# Patient Record
Sex: Female | Born: 1995
Health system: Southern US, Community
[De-identification: ages and names within clinical notes are randomized; demographics above are authoritative.]

## PROBLEM LIST (undated history)

## (undated) ENCOUNTER — Inpatient Hospital Stay (HOSPITAL_COMMUNITY): Payer: Self-pay

## (undated) ENCOUNTER — Ambulatory Visit: Discharge status: Absent without leave | Payer: Medicaid Other

## (undated) DIAGNOSIS — F32A Depression, unspecified: Secondary | ICD-10-CM

## (undated) DIAGNOSIS — K589 Irritable bowel syndrome without diarrhea: Secondary | ICD-10-CM

## (undated) DIAGNOSIS — Z8669 Personal history of other diseases of the nervous system and sense organs: Secondary | ICD-10-CM

## (undated) DIAGNOSIS — F329 Major depressive disorder, single episode, unspecified: Secondary | ICD-10-CM

## (undated) DIAGNOSIS — G629 Polyneuropathy, unspecified: Secondary | ICD-10-CM

## (undated) DIAGNOSIS — R569 Unspecified convulsions: Secondary | ICD-10-CM

## (undated) DIAGNOSIS — F410 Panic disorder [episodic paroxysmal anxiety] without agoraphobia: Secondary | ICD-10-CM

## (undated) DIAGNOSIS — F419 Anxiety disorder, unspecified: Secondary | ICD-10-CM

## (undated) DIAGNOSIS — G40909 Epilepsy, unspecified, not intractable, without status epilepticus: Secondary | ICD-10-CM

## (undated) DIAGNOSIS — E079 Disorder of thyroid, unspecified: Secondary | ICD-10-CM

## (undated) HISTORY — PX: CHOLECYSTECTOMY: SHX55

## (undated) HISTORY — PX: ADENOIDECTOMY: SUR15

## (undated) HISTORY — DX: Epilepsy, unspecified, not intractable, without status epilepticus: G40.909

## (undated) HISTORY — DX: Unspecified convulsions: R56.9

## (undated) HISTORY — PX: TONSILLECTOMY: SUR1361

## (undated) HISTORY — DX: Polyneuropathy, unspecified: G62.9

## (undated) HISTORY — PX: BREAST SURGERY: SHX581

## (undated) HISTORY — DX: Panic disorder (episodic paroxysmal anxiety): F41.0

## (undated) HISTORY — DX: Personal history of other diseases of the nervous system and sense organs: Z86.69

## (undated) HISTORY — PX: EYE SURGERY: SHX253

---
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
## 2016-02-16 ENCOUNTER — Other Ambulatory Visit: Payer: Medicaid Other

## 2016-02-20 ENCOUNTER — Other Ambulatory Visit: Payer: Medicaid Other

## 2016-10-26 ENCOUNTER — Emergency Department (HOSPITAL_COMMUNITY)
Admission: EM | Admit: 2016-10-26 | Discharge: 2016-10-26 | Disposition: A | Discharge status: Home / Self Care | Payer: Medicaid Other | Attending: Emergency Medicine | Admitting: Emergency Medicine

## 2016-10-26 ENCOUNTER — Encounter (HOSPITAL_COMMUNITY): Payer: Self-pay | Admitting: Cardiology

## 2016-10-26 ENCOUNTER — Emergency Department (HOSPITAL_COMMUNITY): Payer: Medicaid Other

## 2016-10-26 DIAGNOSIS — R072 Precordial pain: Secondary | ICD-10-CM

## 2016-10-26 DIAGNOSIS — R0602 Shortness of breath: Secondary | ICD-10-CM

## 2016-10-26 DIAGNOSIS — M25572 Pain in left ankle and joints of left foot: Secondary | ICD-10-CM

## 2016-10-26 LAB — CBC WITH DIFFERENTIAL/PLATELET
Basophils Absolute: 0 10*3/uL (ref 0.0–0.1)
Basophils Relative: 0 %
EOS ABS: 0.1 10*3/uL (ref 0.0–0.7)
Eosinophils Relative: 1 %
HEMATOCRIT: 40.3 % (ref 36.0–46.0)
HEMOGLOBIN: 13.1 g/dL (ref 12.0–15.0)
LYMPHS ABS: 2.2 10*3/uL (ref 0.7–4.0)
LYMPHS PCT: 20 %
MCH: 29.4 pg (ref 26.0–34.0)
MCHC: 32.5 g/dL (ref 30.0–36.0)
MCV: 90.4 fL (ref 78.0–100.0)
MONOS PCT: 6 %
Monocytes Absolute: 0.6 10*3/uL (ref 0.1–1.0)
NEUTROS PCT: 73 %
Neutro Abs: 7.9 10*3/uL — ABNORMAL HIGH (ref 1.7–7.7)
Platelets: 324 10*3/uL (ref 150–400)
RBC: 4.46 MIL/uL (ref 3.87–5.11)
RDW: 13.1 % (ref 11.5–15.5)
WBC: 10.8 10*3/uL — ABNORMAL HIGH (ref 4.0–10.5)

## 2016-10-26 LAB — BASIC METABOLIC PANEL
Anion gap: 9 (ref 5–15)
BUN: 14 mg/dL (ref 6–20)
CHLORIDE: 107 mmol/L (ref 101–111)
CO2: 22 mmol/L (ref 22–32)
CREATININE: 0.51 mg/dL (ref 0.44–1.00)
Calcium: 8.9 mg/dL (ref 8.9–10.3)
GFR calc non Af Amer: >60 mL/min (ref >60)
Glucose, Bld: 85 mg/dL (ref 65–99)
POTASSIUM: 3.7 mmol/L (ref 3.5–5.1)
Sodium: 138 mmol/L (ref 135–145)

## 2016-10-26 LAB — D-DIMER, QUANTITATIVE (NOT AT ARMC): D DIMER QUANT: 0.37 ug{FEU}/mL (ref 0.00–0.50)

## 2016-10-26 LAB — I-STAT TROPONIN, ED: Troponin i, poc: 0 ng/mL (ref 0.00–0.08)

## 2016-10-26 LAB — PREGNANCY, URINE: Preg Test, Ur: POSITIVE — AB

## 2016-10-26 NOTE — ED Provider Notes (Signed)
Emergency Department Provider Note   I have reviewed the triage vital signs and the nursing notes.   HISTORY  Chief Complaint Shortness of Breath   HPI Jody Carter is a 21 y.o. female with PMH of thyroid disease and migraines presents to the emergency department for evaluation of intermittent chest pain with associated dyspnea. Patient has had 3 total episodes. The first episode began last night. She describes a central chest tightness with associated rapid breathing. She experienced tingling around her mouth and in both hands during this event. The chest pain insurance of breath lasted for approximately 2-3 minutes and resolves spontaneously. No provoking or modifying factors. Today the patient had 2 additional episodes which caused her to fall to the ground. Denies fevers, chills, productive cough. No head trauma. She reports some mild chest discomfort at this time states it is slightly worse with deep breathing. Patient also mentions that she had a positive home pregnancy test 2 days ago but tested herself immediately afterwards and obtained a negative test so would like this checked as well. No history of blood clots. No recent surgeries or OCPs.    Past Medical History:  Diagnosis Date  . Hx of migraines   . Thyroid disease     Patient Active Problem List   Diagnosis Date Noted  . Active labor 02/18/2014  . Susceptible to varicella (non-immune), currently pregnant 07/03/2013  . Supervision of normal first teen pregnancy 06/15/2013  . Hx of migraines 07/31/2012    Past Surgical History:  Procedure Laterality Date  . ADENOIDECTOMY    . BREAST SURGERY     augmentation  . EYE SURGERY Bilateral    closed tear ducts opened as child  . TONSILLECTOMY      Current Outpatient Rx  . Order #: [id number] Class: Historical Med    Allergies Sumatriptan  Family History  Problem Relation Age of Onset  . Heart attack Father   . Diabetes Paternal Grandfather   .  Hypertension Maternal Grandmother   . Heart attack Maternal Grandmother   . Hypertension Maternal Grandfather   . Heart attack Maternal Grandfather     Social History Social History  Substance Use Topics  . Smoking status: Never Smoker  . Smokeless tobacco: Never Used  . Alcohol use No    Review of Systems  Constitutional: No fever/chills. Positive lightheadedness and fall.  Eyes: No visual changes. ENT: No sore throat. Cardiovascular: Positive chest pain. Respiratory: Positive shortness of breath. Gastrointestinal: No abdominal pain.  No nausea, no vomiting.  No diarrhea.  No constipation. Genitourinary: Negative for dysuria. Musculoskeletal: Negative for back pain. Skin: Negative for rash. Neurological: Negative for headaches, focal weakness or numbness.  10-point ROS otherwise negative.  ____________________________________________   PHYSICAL EXAM:  VITAL SIGNS: ED Triage Vitals  Enc Vitals Group     BP 10/26/16 1234 (!) 146/52     Pulse Rate 10/26/16 1234 93     Resp 10/26/16 1234 18     Temp 10/26/16 1234 98.2 F (36.8 C)     Temp Source 10/26/16 1234 Oral     SpO2 10/26/16 1234 100 %     Weight 10/26/16 1236 136 lb (61.7 kg)     Height 10/26/16 1236  (1.575 m)     Pain Score 10/26/16 1234 6   Constitutional: Alert and oriented. Well appearing and in no acute distress. Eyes: Conjunctivae are normal. Head: Atraumatic. Nose: No congestion/rhinnorhea. Mouth/Throat: Mucous membranes are moist.  Neck: No stridor.  Cardiovascular:  Normal rate, regular rhythm. Good peripheral circulation. Grossly normal heart sounds.   Respiratory: Normal respiratory effort.  No retractions. Lungs CTAB. Gastrointestinal: Soft and nontender. No distention.  Musculoskeletal: No lower extremity tenderness nor edema. No gross deformities of extremities. Neurologic:  Normal speech and language. No gross focal neurologic deficits are appreciated.  Skin:  Skin is warm, dry and  intact. No rash noted.  ____________________________________________   LABS (all labs ordered are listed, but only abnormal results are displayed)  Labs Reviewed  CBC WITH DIFFERENTIAL/PLATELET - Abnormal; Notable for the following:       Result Value   WBC 10.8 (*)    Neutro Abs 7.9 (*)    All other components within normal limits  PREGNANCY, URINE - Abnormal; Notable for the following:    Preg Test, Ur POSITIVE (*)    All other components within normal limits  BASIC METABOLIC PANEL  D-DIMER, QUANTITATIVE (NOT AT [facility])  I-STAT TROPONIN, ED   ____________________________________________  EKG   EKG Interpretation  Date/Time:  Tuesday October 26 2016 15:53:42 EDT Ventricular Rate:  93 PR Interval:  130 QRS Duration: 119 QT Interval:  355 QTC Calculation: 442 R Axis:   67 Text Interpretation:  Sinus rhythm Incomplete right bundle branch block No STEMI.  Confirmed by Alona Bene [phone]) on 10/26/2016 4:02:00 PM       ____________________________________________  RADIOLOGY  Dg Chest 2 View  Result Date: 10/26/2016 CLINICAL DATA:  Shortness of breath and central chest pain began last night. The patient had 2 episodes of this this morning associated with dizziness and a fall. Currently the chest discomfort and shortness of breath have abated. EXAM: CHEST  2 VIEW COMPARISON:  Chest x-ray of April 14, 2015 FINDINGS: The lungs are mildly hyperinflated and clear. The heart and pulmonary vascularity are normal. There is no pleural effusion or pneumothorax. The bony thorax is unremarkable. IMPRESSION: Mild hyperinflation may be voluntary or may reflect reactive airway disease. There is no other cardiopulmonary abnormality. Electronically Signed   By: David  [place] M.D.   On: 10/26/2016 16:21   Dg Ankle Complete Left  Result Date: 10/26/2016 CLINICAL DATA:  Dizziness and syncope with resultant fall. Persistent left ankle pain. EXAM: LEFT ANKLE COMPLETE - 3+ VIEW COMPARISON:  None in  PACs FINDINGS: The bones are subjectively adequately mineralized. There is no acute malleolar fracture. The ankle joint mortise is preserved. The talar dome is intact. The remainder the talus as well as calcaneus are also intact. There is irregularity of the posterosuperior articular margin of the tarsal navicular which is likely degenerative. There is no overlying soft tissue swelling. IMPRESSION: There is no acute bony abnormality of the ankle. Mild soft tissue swelling medially is observed. Probable old avulsion from the posterosuperior margin of the tarsal navicular along the superior aspect of the articulation with the talus. Electronically Signed   By: David  [place] M.D.   On: 10/26/2016 16:23    ____________________________________________   PROCEDURES  Procedure(s) performed:   Procedures  None ____________________________________________   INITIAL IMPRESSION / ASSESSMENT AND PLAN / ED COURSE  Pertinent labs & imaging results that were available during my care of the patient were reviewed by me and considered in my medical decision making (see chart for details).  Patient resents emergency department for evaluation of multiple episodes of chest pain with shortness of breath and lightheadedness. She had a fall and some resulting left ankle pain. Patient with mild active symptoms at this time. Chest pain shortness of  breath seemed to be episodic which lower my suspicion for PE or ACS. Plan for D-dimer, Troponin, and EKG along with CXR.   Differential includes all life-threatening causes for chest pain. This includes but is not exclusive to acute coronary syndrome, aortic dissection, pulmonary embolism, cardiac tamponade, community-acquired pneumonia, pericarditis, musculoskeletal chest wall pain, etc.   05:05 PM Lab work is largely unremarkable including troponin and d-dimer. Extremely low suspicion for PE at this point so will not follow with additional imaging. Patient's urine  pregnancy test is positive. Discussed this with her and she will follow with OB/Gyn from last pregnancy. Discussed PCP follow up for SOB/CP symptoms.   At this time, I do not feel there is any life-threatening condition present. I have reviewed and discussed all results (EKG, imaging, lab, urine as appropriate), exam findings with patient. I have reviewed nursing notes and appropriate previous records.  I feel the patient is safe to be discharged home without further emergent workup. Discussed usual and customary return precautions. Patient and family (if present) verbalize understanding and are comfortable with this plan.  Patient will follow-up with their primary care provider. If they do not have a primary care provider, information for follow-up has been provided to them. All questions have been answered.  ____________________________________________  FINAL CLINICAL IMPRESSION(S) / ED DIAGNOSES  Final diagnoses:  Shortness of breath  Acute left ankle pain  Precordial chest pain     MEDICATIONS GIVEN DURING THIS VISIT:  Medications - No data to display   NEW OUTPATIENT MEDICATIONS STARTED DURING THIS VISIT:  None   Note:  This document was prepared using Dragon voice recognition software and may include unintentional dictation errors.  Alona Bene, MD Emergency Medicine    Long, Arlyss Repress, MD 10/26/16 1710

## 2016-10-26 NOTE — ED Triage Notes (Signed)
2 episodes this morning where she felt like she could not catch her breath. Pt states she fell also this morning going up the stairs.  Abrasion to left knee .  C/o pain to left ankle.  Pt states her breathing has improved some.  Pt states her chest has also been hurting this morning.

## 2016-10-26 NOTE — Discharge Instructions (Signed)
You were seen in the ED today with chest pain and difficulty breathing. Your testing today was normal. You will need to follow up with your PCP in the coming week to review your symptoms.   Your urine pregnancy test was positive. You will need to establish care with an OB/Gyn for further care. I have listed an OB/Gyn that you can call if your prior provider cannot see you.   Return to the ED with any new or worsening chest pain, difficulty breathing, vaginal bleeding, or lower abdominal pain.

## 2016-12-17 ENCOUNTER — Encounter (HOSPITAL_COMMUNITY): Payer: Self-pay

## 2016-12-17 ENCOUNTER — Emergency Department (HOSPITAL_COMMUNITY)
Admission: EM | Admit: 2016-12-17 | Discharge: 2016-12-17 | Disposition: A | Discharge status: Home / Self Care | Payer: Medicaid Other | Attending: Emergency Medicine | Admitting: Emergency Medicine

## 2016-12-17 ENCOUNTER — Other Ambulatory Visit: Payer: Self-pay

## 2016-12-17 DIAGNOSIS — R1900 Intra-abdominal and pelvic swelling, mass and lump, unspecified site: Secondary | ICD-10-CM | POA: Diagnosis present

## 2016-12-17 DIAGNOSIS — L03311 Cellulitis of abdominal wall: Secondary | ICD-10-CM | POA: Insufficient documentation

## 2016-12-17 DIAGNOSIS — L02211 Cutaneous abscess of abdominal wall: Secondary | ICD-10-CM | POA: Insufficient documentation

## 2016-12-17 DIAGNOSIS — L0291 Cutaneous abscess, unspecified: Secondary | ICD-10-CM

## 2016-12-17 MED ORDER — LEVOFLOXACIN 500 MG PO TABS: 500.0000 mg | ORAL_TABLET | Freq: Every day | ORAL | 0 refills | Status: DC

## 2016-12-17 MED ORDER — CEPHALEXIN 500 MG PO CAPS: 500.0000 mg | ORAL_CAPSULE | Freq: Four times a day (QID) | ORAL | 0 refills | Status: DC

## 2016-12-17 MED ORDER — LIDOCAINE HCL (PF) 1 % IJ SOLN
5.0000 mL | Freq: Once | INTRAMUSCULAR | Status: AC
  Administered 2016-12-17: 5 mL
  Filled 2016-12-17: qty 5

## 2016-12-17 NOTE — Discharge Instructions (Signed)
Medications: Levaquin, Keflex  Treatment: Take the Levaquin and Keflex as prescribed.  Make sure to finish all these medications.  Keep the dressing applied for 24 hours.  After this time he can wash with warm soapy water and apply clean dressing.  Follow-up: Please return in 2 days for wound check.  Please return sooner if you develop any fevers over 100.4, increasing pain, spreading of the redness surrounding, red streaking away from the wound, or any other new or concerning symptoms.

## 2016-12-17 NOTE — ED Triage Notes (Signed)
Per Pt, Pt had naval piercing that started to become red and be painful. Pt reports taking the piercing out and since then, the site has become more red and tender. Denies fevers. Reports some clear discharge with some blood.

## 2016-12-18 NOTE — ED Provider Notes (Signed)
[facility]   CSN: [id number] Arrival date & time: 12/17/16  1854     History   Chief Complaint Chief Complaint  Patient presents with  . Wound Infection    HPI Jody Carter is a 21 y.o. female with history of thyroid disease, migraines who presents with a several day history of swelling to her navel.  Patient reports getting her navel pierced in July and having she began having redness, pain, and clear drainage as well as blood coming from the piercing.  She removed the piercing, however it has since become increasingly painful and warm.  She denies any fevers.  Patient reports she has tried to have her navel pierced several times in the past, however has had problems and had to take it out.  Of note, she reports she recently had a spontaneous abortion 1 week ago.  HPI  Past Medical History:  Diagnosis Date  . Hx of migraines   . Thyroid disease     Patient Active Problem List   Diagnosis Date Noted  . Active labor 02/18/2014  . Susceptible to varicella (non-immune), currently pregnant 07/03/2013  . Supervision of normal first teen pregnancy 06/15/2013  . Hx of migraines 07/31/2012    Past Surgical History:  Procedure Laterality Date  . ADENOIDECTOMY    . BREAST SURGERY     augmentation  . EYE SURGERY Bilateral    closed tear ducts opened as child  . TONSILLECTOMY      OB History    Gravida Para Term Preterm AB Living   1 1 1     1    SAB TAB Ectopic Multiple Live Births         0 1       Home Medications    Prior to Admission medications   Medication Sig Start Date End Date Taking? Authorizing Provider  ibuprofen (ADVIL,MOTRIN) 200 MG tablet Take 600 mg by mouth every 6 (six) hours as needed for mild pain.   Yes [provider]  levothyroxine (SYNTHROID, LEVOTHROID) 88 MCG tablet Take 88 mcg by mouth daily before breakfast.    Yes [provider]  cephALEXin (KEFLEX) 500 MG capsule  Take 1 capsule (500 mg total) by mouth 4 (four) times daily. 12/17/16   Nandi Tonnesen, Waylan BogaAlexandra M, PA-C  levofloxacin (LEVAQUIN) 500 MG tablet Take 1 tablet (500 mg total) by mouth daily. 12/17/16   Emi HolesLaw, Laverle Pillard M, PA-C    Family History Family History  Problem Relation Age of Onset  . Heart attack Father   . Diabetes Paternal Grandfather   . Hypertension Maternal Grandmother   . Heart attack Maternal Grandmother   . Hypertension Maternal Grandfather   . Heart attack Maternal Grandfather     Social History Social History   Tobacco Use  . Smoking status: Never Smoker  . Smokeless tobacco: Never Used  Substance Use Topics  . Alcohol use: No  . Drug use: No     Allergies   Sumatriptan   Review of Systems Review of Systems  Constitutional: Negative for fever.  Skin: Positive for color change and wound.     Physical Exam Updated Vital Signs BP 120/65 (BP Location: Right Arm)   Pulse 75   Temp 98.7 F (37.1 C) (Oral)   Resp 16   Ht 5\' 2"  (1.575 m)   Wt 59 kg (130 lb)   SpO2 98%   BMI 23.78 kg/m   Physical Exam  Constitutional: She  appears well-developed and well-nourished. No distress.  HENT:  Head: Normocephalic and atraumatic.  Mouth/Throat: Oropharynx is clear and moist. No oropharyngeal exudate.  Eyes: Conjunctivae are normal. Pupils are equal, round, and reactive to light. Right eye exhibits no discharge. Left eye exhibits no discharge. No scleral icterus.  Neck: Normal range of motion. Neck supple. No thyromegaly present.  Cardiovascular: Normal rate, regular rhythm, normal heart sounds and intact distal pulses. Exam reveals no gallop and no friction rub.  No murmur heard. Pulmonary/Chest: Effort normal and breath sounds normal. No stridor. No respiratory distress. She has no wheezes. She has no rales.  Abdominal: Soft. Bowel sounds are normal. She exhibits no distension. There is no tenderness. There is no rebound and no guarding.    Musculoskeletal: She  exhibits no edema.  Lymphadenopathy:    She has no cervical adenopathy.  Neurological: She is alert. Coordination normal.  Skin: Skin is warm and dry. No rash noted. She is not diaphoretic. No pallor.  Psychiatric: She has a normal mood and affect.  Nursing note and vitals reviewed.    ED Treatments / Results  Labs (all labs ordered are listed, but only abnormal results are displayed) Labs Reviewed - No data to display  EKG  EKG Interpretation None       Radiology No results found.  Procedures .Marland KitchenIncision and Drainage Date/Time: 12/18/2016 1:47 AM Performed by: Emi Holes, PA-C Authorized by: Emi Holes, PA-C   Consent:    Consent obtained:  Verbal   Consent given by:  Patient   Risks discussed:  Bleeding and pain   Alternatives discussed:  No treatment and alternative treatment Location:    Type:  Abscess   Size:  1.5 cm diameter   Location:  Trunk   Trunk location:  Abdomen Pre-procedure details:    Procedure prep: iodine. Anesthesia (see MAR for exact dosages):    Anesthesia method:  Local infiltration   Local anesthetic:  Lidocaine 1% w/o epi Procedure type:    Complexity:  Simple Procedure details:    Incision types:  Single straight   Incision depth:  Dermal   Scalpel blade:  11   Wound management:  Probed and deloculated and irrigated with saline   Drainage:  Purulent and bloody   Drainage amount:  Moderate   Wound treatment:  Wound left open   Packing materials:  None Post-procedure details:    Patient tolerance of procedure:  Tolerated well, no immediate complications   (including critical care time)  Medications Ordered in ED Medications  lidocaine (PF) (XYLOCAINE) 1 % injection 5 mL (5 mLs Infiltration Given 12/17/16 2214)     Initial Impression / Assessment and Plan / ED Course  I have reviewed the triage vital signs and the nursing notes.  Pertinent labs & imaging results that were available during my care of the patient  were reviewed by me and considered in my medical decision making (see chart for details).     Patient with skin abscess. Incision and drainage performed in the ED today.  Abscess was not large enough to warrant packing or drain placement. Wound recheck in 2 days. Supportive care and return precautions discussed.  Pt sent home with Levaquin and Keflex, considering puncture wound and foreign body to cover Pseudomonas.  Patient advised not to put her piercing back in and advised not to try and re-pierced area.  Patient understands and agrees with plan.  Patient vitals stable throughout ED course and discharged in satisfactory condition.  Final Clinical Impressions(s) / ED Diagnoses   Final diagnoses:  Abscess  Cellulitis of abdominal wall    ED Discharge Orders        Ordered    levofloxacin (LEVAQUIN) 500 MG tablet  Daily     12/17/16 2227    cephALEXin (KEFLEX) 500 MG capsule  4 times daily     12/17/16 2227       Emi HolesLaw, Dwan Hemmelgarn M, PA-C 12/18/16 0151    Doug SouJacubowitz, Sam, MD 12/18/16 [phone]

## 2017-01-30 ENCOUNTER — Emergency Department (HOSPITAL_COMMUNITY)
Admission: EM | Admit: 2017-01-30 | Discharge: 2017-01-30 | Disposition: A | Discharge status: Home / Self Care | Payer: Medicaid Other | Attending: Emergency Medicine | Admitting: Emergency Medicine

## 2017-01-30 ENCOUNTER — Encounter (HOSPITAL_COMMUNITY): Payer: Self-pay | Admitting: Emergency Medicine

## 2017-01-30 ENCOUNTER — Other Ambulatory Visit: Payer: Self-pay

## 2017-01-30 ENCOUNTER — Emergency Department (HOSPITAL_COMMUNITY): Payer: Medicaid Other

## 2017-01-30 DIAGNOSIS — Y929 Unspecified place or not applicable: Secondary | ICD-10-CM | POA: Diagnosis not present

## 2017-01-30 DIAGNOSIS — Y999 Unspecified external cause status: Secondary | ICD-10-CM | POA: Insufficient documentation

## 2017-01-30 DIAGNOSIS — W228XXA Striking against or struck by other objects, initial encounter: Secondary | ICD-10-CM | POA: Diagnosis not present

## 2017-01-30 DIAGNOSIS — Z79899 Other long term (current) drug therapy: Secondary | ICD-10-CM | POA: Insufficient documentation

## 2017-01-30 DIAGNOSIS — S6991XA Unspecified injury of right wrist, hand and finger(s), initial encounter: Secondary | ICD-10-CM | POA: Insufficient documentation

## 2017-01-30 DIAGNOSIS — Y9389 Activity, other specified: Secondary | ICD-10-CM | POA: Insufficient documentation

## 2017-01-30 NOTE — ED Provider Notes (Signed)
[facility] EMERGENCY DEPARTMENT Provider Note   CSN: [id number] Arrival date & time: 01/30/17  2100     History   Chief Complaint Chief Complaint  Patient presents with  . Hand Pain    HPI Jody Carter is a 22 y.o. female.  HPI   Jody Carter is a 23 y.o. female, presenting to the ED with right hand pain beginning this morning.  Patient states she punched a wall.  Pain is the dorsal ulnar right hand.  Pain is moderate, throbbing, radiating into the wrist.  Endorses decreased sensation to the dorsal hand.  Denies weakness, wounds, other injuries, or any other complaints.      Past Medical History:  Diagnosis Date  . Hx of migraines   . Thyroid disease     Patient Active Problem List   Diagnosis Date Noted  . Active labor 02/18/2014  . Susceptible to varicella (non-immune), currently pregnant 07/03/2013  . Supervision of normal first teen pregnancy 06/15/2013  . Hx of migraines 07/31/2012    Past Surgical History:  Procedure Laterality Date  . ADENOIDECTOMY    . BREAST SURGERY     augmentation  . EYE SURGERY Bilateral    closed tear ducts opened as child  . TONSILLECTOMY      OB History    Gravida Para Term Preterm AB Living   1 1 1     1    SAB TAB Ectopic Multiple Live Births         0 1       Home Medications    Prior to Admission medications   Medication Sig Start Date End Date Taking? Authorizing Provider  cephALEXin (KEFLEX) 500 MG capsule Take 1 capsule (500 mg total) by mouth 4 (four) times daily. 12/17/16   Law, Waylan Boga, PA-C  ibuprofen (ADVIL,MOTRIN) 200 MG tablet Take 600 mg by mouth every 6 (six) hours as needed for mild pain.    [provider]  levofloxacin (LEVAQUIN) 500 MG tablet Take 1 tablet (500 mg total) by mouth daily. 12/17/16   Law, Waylan Boga, PA-C  levothyroxine (SYNTHROID, LEVOTHROID) 88 MCG tablet Take 88 mcg by mouth daily before breakfast.     [provider]    Family  History Family History  Problem Relation Age of Onset  . Heart attack Father   . Diabetes Paternal Grandfather   . Hypertension Maternal Grandmother   . Heart attack Maternal Grandmother   . Hypertension Maternal Grandfather   . Heart attack Maternal Grandfather     Social History Social History   Tobacco Use  . Smoking status: Never Smoker  . Smokeless tobacco: Never Used  Substance Use Topics  . Alcohol use: No  . Drug use: No     Allergies   Sumatriptan   Review of Systems Review of Systems  Musculoskeletal: Positive for arthralgias.  Neurological: Negative for weakness.     Physical Exam Updated Vital Signs BP 124/69 (BP Location: Left Arm)   Pulse 62   Temp 97.9 F (36.6 C) (Oral)   Resp 16   Ht 5\' 2"  (1.575 m)   Wt 62.6 kg (138 lb)   SpO2 99%   BMI 25.24 kg/m   Physical Exam  Constitutional: She appears well-developed and well-nourished. No distress.  HENT:  Head: Normocephalic and atraumatic.  Eyes: Conjunctivae are normal.  Neck: Neck supple.  Cardiovascular: Normal rate, regular rhythm and intact distal pulses.  Pulmonary/Chest: Effort normal.  Musculoskeletal: She exhibits  tenderness.  Some tenderness and bruising over the right fifth metacarpal.  No noted instability, crepitus, or deformity. Full range of motion in the right hand, wrist, and elbow.  Neurological: She is alert.  Patient endorses decreased sensation to the right dorsal ulnar hand. No other sensory abnormalities noted. Patient can touch the thumb to each one of the fingertips without difficulty.  Abduction and adduction of the fingers intact against resistance. Grip strength equal bilaterally. Strength 5/5 through the cardinal directions of the bilateral wrists. Strength 5/5 with flexion and extension of the bilateral elbows.  Skin: Skin is warm and dry. Capillary refill takes less than 2 seconds. She is not diaphoretic. No pallor.  Psychiatric: She has a normal mood and  affect. Her behavior is normal.  Nursing note and vitals reviewed.    ED Treatments / Results  Labs (all labs ordered are listed, but only abnormal results are displayed) Labs Reviewed - No data to display  EKG  EKG Interpretation None       Radiology Dg Hand Complete Right  Result Date: 01/30/2017 CLINICAL DATA:  Pain after punching a wall. EXAM: RIGHT HAND - COMPLETE 3+ VIEW COMPARISON:  None. FINDINGS: Soft tissue swelling over the ulnar aspect of the hand and overlying the heads of the metacarpals on the lateral view. No acute fracture nor joint dislocations. IMPRESSION: Soft tissue swelling of the hand without acute fracture. Electronically Signed   By: Tollie Ethavid  Kwon M.D.   On: 01/30/2017 22:51    Procedures Procedures (including critical care time)  Medications Ordered in ED Medications - No data to display   Initial Impression / Assessment and Plan / ED Course  I have reviewed the triage vital signs and the nursing notes.  Pertinent labs & imaging results that were available during my care of the patient were reviewed by me and considered in my medical decision making (see chart for details).     Patient presents with right hand injury.  No acute abnormalities on x-ray.  Suspect decreased sensation may be from neuropraxia.  PCP versus hand specialist follow-up as needed. The patient was given instructions for home care as well as return precautions. Patient voices understanding of these instructions, accepts the plan, and is comfortable with discharge.     Final Clinical Impressions(s) / ED Diagnoses   Final diagnoses:  Injury of right hand, initial encounter    ED Discharge Orders    None       Concepcion LivingJoy, Karlea Mckibbin C, PA-C 01/30/17 2331    Rolland PorterJames, Mark, MD 02/08/17 [phone]

## 2017-01-30 NOTE — ED Triage Notes (Signed)
Pt c/o R hand pain onset this am after punching a wall. Bruising noted to hand.

## 2017-01-30 NOTE — Discharge Instructions (Signed)
You have been seen today for a hand injury. There were no acute abnormalities on the x-rays, including no sign of fracture or dislocation, however, there could be injuries to the soft tissues, such as the ligaments or tendons that are not seen on xrays. There could also be what are called occult fractures that are small fractures not seen on xray. Pain: Take 600 mg of ibuprofen every 6 hours or 440 mg (over the counter dose) to 500 mg (prescription dose) of naproxen every 12 hours for the next 3 days. After this time, these medications may be used as needed for pain. Take these medications with food to avoid upset stomach. Choose only one of these medications, do not take them together.  Tylenol: Should you continue to have additional pain while taking the ibuprofen or naproxen, you may add in tylenol as needed. Your daily total maximum amount of tylenol from all sources should be limited to 4000mg /day for persons without liver problems, or 2000mg /day for those with liver problems. Ice: May apply ice to the area over the next 24 hours for 15 minutes at a time to reduce swelling. Elevation: Keep the extremity elevated as often as possible to reduce pain and inflammation. Support: Wear the hand brace for support and comfort. Wear this until pain resolves.  Exercises: Start by performing these exercises a few times a week, increasing the frequency until you are performing them twice daily.  Follow up: If symptoms are improving, you may follow up with your primary care provider for any continued management. If symptoms are not improving, you may follow up with the orthopedic specialist.

## 2017-04-09 ENCOUNTER — Encounter (HOSPITAL_COMMUNITY): Payer: Self-pay | Admitting: Emergency Medicine

## 2017-04-09 ENCOUNTER — Emergency Department (HOSPITAL_COMMUNITY)
Admission: EM | Admit: 2017-04-09 | Discharge: 2017-04-10 | Disposition: A | Discharge status: Home / Self Care | Payer: Medicaid Other | Attending: Emergency Medicine | Admitting: Emergency Medicine

## 2017-04-09 DIAGNOSIS — Y929 Unspecified place or not applicable: Secondary | ICD-10-CM | POA: Insufficient documentation

## 2017-04-09 DIAGNOSIS — X781XXA Intentional self-harm by knife, initial encounter: Secondary | ICD-10-CM | POA: Diagnosis not present

## 2017-04-09 DIAGNOSIS — Y939 Activity, unspecified: Secondary | ICD-10-CM | POA: Diagnosis not present

## 2017-04-09 DIAGNOSIS — Y998 Other external cause status: Secondary | ICD-10-CM | POA: Diagnosis not present

## 2017-04-09 DIAGNOSIS — S1081XA Abrasion of other specified part of neck, initial encounter: Secondary | ICD-10-CM | POA: Diagnosis not present

## 2017-04-09 DIAGNOSIS — R45851 Suicidal ideations: Secondary | ICD-10-CM | POA: Diagnosis not present

## 2017-04-09 DIAGNOSIS — X780XXA Intentional self-harm by sharp glass, initial encounter: Secondary | ICD-10-CM | POA: Insufficient documentation

## 2017-04-09 DIAGNOSIS — Z79899 Other long term (current) drug therapy: Secondary | ICD-10-CM | POA: Insufficient documentation

## 2017-04-09 DIAGNOSIS — S50812A Abrasion of left forearm, initial encounter: Secondary | ICD-10-CM | POA: Diagnosis not present

## 2017-04-09 DIAGNOSIS — F329 Major depressive disorder, single episode, unspecified: Secondary | ICD-10-CM | POA: Insufficient documentation

## 2017-04-09 LAB — COMPREHENSIVE METABOLIC PANEL
ALK PHOS: 55 U/L (ref 38–126)
ALT: 11 U/L — ABNORMAL LOW (ref 14–54)
ANION GAP: 9 (ref 5–15)
AST: 15 U/L (ref 15–41)
Albumin: 4.1 g/dL (ref 3.5–5.0)
BILIRUBIN TOTAL: 0.7 mg/dL (ref 0.3–1.2)
BUN: 18 mg/dL (ref 6–20)
CALCIUM: 9.3 mg/dL (ref 8.9–10.3)
CO2: 23 mmol/L (ref 22–32)
Chloride: 109 mmol/L (ref 101–111)
Creatinine, Ser: 0.46 mg/dL (ref 0.44–1.00)
GFR calc Af Amer: >60 mL/min (ref >60)
Glucose, Bld: 96 mg/dL (ref 65–99)
Potassium: 3.9 mmol/L (ref 3.5–5.1)
Sodium: 141 mmol/L (ref 135–145)
TOTAL PROTEIN: 6.8 g/dL (ref 6.5–8.1)

## 2017-04-09 LAB — CBC WITH DIFFERENTIAL/PLATELET
BASOS ABS: 0 10*3/uL (ref 0.0–0.1)
BASOS PCT: 0 %
EOS ABS: 0.1 10*3/uL (ref 0.0–0.7)
Eosinophils Relative: 1 %
HEMATOCRIT: 38.9 % (ref 36.0–46.0)
Hemoglobin: 12.4 g/dL (ref 12.0–15.0)
Lymphocytes Relative: 22 %
Lymphs Abs: 2.8 10*3/uL (ref 0.7–4.0)
MCH: 29 pg (ref 26.0–34.0)
MCHC: 31.9 g/dL (ref 30.0–36.0)
MCV: 90.9 fL (ref 78.0–100.0)
MONO ABS: 0.7 10*3/uL (ref 0.1–1.0)
Monocytes Relative: 5 %
NEUTROS ABS: 8.9 10*3/uL — AB (ref 1.7–7.7)
Neutrophils Relative %: 72 %
Platelets: 319 10*3/uL (ref 150–400)
RBC: 4.28 MIL/uL (ref 3.87–5.11)
RDW: 13.1 % (ref 11.5–15.5)
WBC: 12.5 10*3/uL — ABNORMAL HIGH (ref 4.0–10.5)

## 2017-04-09 LAB — ETHANOL

## 2017-04-09 LAB — HCG, QUANTITATIVE, PREGNANCY

## 2017-04-09 MED ORDER — ACETAMINOPHEN 325 MG PO TABS
650.0000 mg | ORAL_TABLET | Freq: Once | ORAL | Status: AC
  Administered 2017-04-09: 650 mg via ORAL
  Filled 2017-04-09: qty 2

## 2017-04-09 MED ORDER — LORAZEPAM 1 MG PO TABS
1.0000 mg | ORAL_TABLET | Freq: Once | ORAL | Status: AC
  Administered 2017-04-09: 1 mg via ORAL
  Filled 2017-04-09: qty 1

## 2017-04-09 MED ORDER — ACETAMINOPHEN 325 MG PO TABS
650.0000 mg | ORAL_TABLET | ORAL | Status: DC | PRN
  Administered 2017-04-10: 650 mg via ORAL
  Filled 2017-04-09: qty 2

## 2017-04-09 MED ORDER — ONDANSETRON HCL 4 MG PO TABS: 4.0000 mg | ORAL_TABLET | Freq: Three times a day (TID) | ORAL | Status: DC | PRN
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
## 2017-04-10 ENCOUNTER — Other Ambulatory Visit: Payer: Self-pay

## 2017-04-10 ENCOUNTER — Encounter (HOSPITAL_COMMUNITY): Payer: Self-pay | Admitting: *Deleted

## 2017-04-10 ENCOUNTER — Inpatient Hospital Stay (HOSPITAL_COMMUNITY)
Admission: AD | Admit: 2017-04-10 | Discharge: 2017-04-13 | DRG: 885 | Disposition: A | Discharge status: Home / Self Care | Payer: Medicaid Other | Source: Intra-hospital | Attending: Psychiatry | Admitting: Psychiatry

## 2017-04-10 DIAGNOSIS — Z62810 Personal history of physical and sexual abuse in childhood: Secondary | ICD-10-CM | POA: Diagnosis present

## 2017-04-10 DIAGNOSIS — G4700 Insomnia, unspecified: Secondary | ICD-10-CM | POA: Diagnosis not present

## 2017-04-10 DIAGNOSIS — F419 Anxiety disorder, unspecified: Secondary | ICD-10-CM | POA: Diagnosis not present

## 2017-04-10 DIAGNOSIS — Z62811 Personal history of psychological abuse in childhood: Secondary | ICD-10-CM | POA: Diagnosis present

## 2017-04-10 DIAGNOSIS — K219 Gastro-esophageal reflux disease without esophagitis: Secondary | ICD-10-CM | POA: Diagnosis present

## 2017-04-10 DIAGNOSIS — Z818 Family history of other mental and behavioral disorders: Secondary | ICD-10-CM | POA: Diagnosis not present

## 2017-04-10 DIAGNOSIS — R450 Nervousness: Secondary | ICD-10-CM | POA: Diagnosis not present

## 2017-04-10 DIAGNOSIS — Z888 Allergy status to other drugs, medicaments and biological substances status: Secondary | ICD-10-CM

## 2017-04-10 DIAGNOSIS — E039 Hypothyroidism, unspecified: Secondary | ICD-10-CM | POA: Diagnosis present

## 2017-04-10 DIAGNOSIS — F319 Bipolar disorder, unspecified: Principal | ICD-10-CM | POA: Diagnosis present

## 2017-04-10 DIAGNOSIS — Z813 Family history of other psychoactive substance abuse and dependence: Secondary | ICD-10-CM | POA: Diagnosis not present

## 2017-04-10 DIAGNOSIS — F4310 Post-traumatic stress disorder, unspecified: Secondary | ICD-10-CM | POA: Diagnosis present

## 2017-04-10 DIAGNOSIS — Z79899 Other long term (current) drug therapy: Secondary | ICD-10-CM | POA: Diagnosis not present

## 2017-04-10 DIAGNOSIS — R45851 Suicidal ideations: Secondary | ICD-10-CM | POA: Diagnosis not present

## 2017-04-10 DIAGNOSIS — F4010 Social phobia, unspecified: Secondary | ICD-10-CM | POA: Diagnosis not present

## 2017-04-10 DIAGNOSIS — F3160 Bipolar disorder, current episode mixed, unspecified: Secondary | ICD-10-CM | POA: Diagnosis not present

## 2017-04-10 DIAGNOSIS — X789XXA Intentional self-harm by unspecified sharp object, initial encounter: Secondary | ICD-10-CM | POA: Diagnosis not present

## 2017-04-10 DIAGNOSIS — F3181 Bipolar II disorder: Secondary | ICD-10-CM | POA: Diagnosis present

## 2017-04-10 HISTORY — DX: Major depressive disorder, single episode, unspecified: F32.9

## 2017-04-10 HISTORY — DX: Depression, unspecified: F32.A

## 2017-04-10 HISTORY — DX: Anxiety disorder, unspecified: F41.9

## 2017-04-10 LAB — RAPID URINE DRUG SCREEN, HOSP PERFORMED
AMPHETAMINES: NOT DETECTED
BARBITURATES: NOT DETECTED
BENZODIAZEPINES: POSITIVE — AB
Cocaine: NOT DETECTED
Opiates: NOT DETECTED
Tetrahydrocannabinol: POSITIVE — AB

## 2017-04-10 MED ORDER — PANTOPRAZOLE SODIUM 40 MG PO TBEC
40.0000 mg | DELAYED_RELEASE_TABLET | Freq: Every day | ORAL | Status: DC
  Administered 2017-04-10 – 2017-04-13 (×4): 40 mg via ORAL
  Filled 2017-04-10 (×8): qty 1

## 2017-04-10 MED ORDER — ALUM & MAG HYDROXIDE-SIMETH 200-200-20 MG/5ML PO SUSP: 30.0000 mL | ORAL | Status: DC | PRN

## 2017-04-10 MED ORDER — TRAZODONE HCL 50 MG PO TABS
50.0000 mg | ORAL_TABLET | Freq: Every evening | ORAL | Status: DC | PRN
  Administered 2017-04-10 – 2017-04-12 (×3): 50 mg via ORAL
  Filled 2017-04-10 (×3): qty 1

## 2017-04-10 MED ORDER — LEVOTHYROXINE SODIUM 88 MCG PO TABS
88.0000 ug | ORAL_TABLET | Freq: Every day | ORAL | Status: DC
  Administered 2017-04-11 – 2017-04-13 (×3): 88 ug via ORAL
  Filled 2017-04-10 (×5): qty 1

## 2017-04-10 MED ORDER — HYDROXYZINE HCL 25 MG PO TABS
25.0000 mg | ORAL_TABLET | Freq: Three times a day (TID) | ORAL | Status: DC | PRN
  Administered 2017-04-10 – 2017-04-12 (×5): 25 mg via ORAL
  Filled 2017-04-10 (×5): qty 1

## 2017-04-10 MED ORDER — IBUPROFEN 600 MG PO TABS
600.0000 mg | ORAL_TABLET | Freq: Four times a day (QID) | ORAL | Status: DC | PRN
  Administered 2017-04-11: 600 mg via ORAL
  Filled 2017-04-10 (×2): qty 1

## 2017-04-10 MED ORDER — MAGNESIUM HYDROXIDE 400 MG/5ML PO SUSP: 30.0000 mL | Freq: Every day | ORAL | Status: DC | PRN

## 2017-04-10 MED ORDER — ESCITALOPRAM OXALATE 10 MG PO TABS
10.0000 mg | ORAL_TABLET | Freq: Every day | ORAL | Status: DC
  Administered 2017-04-10 – 2017-04-11 (×2): 10 mg via ORAL
  Filled 2017-04-10 (×5): qty 1

## 2017-04-10 NOTE — Progress Notes (Signed)
Adult Psychoeducational Group Note  Date:  04/10/2017 Time:  9:03 PM  Group Topic/Focus:  Wrap-Up Group:   The focus of this group is to help patients review their daily goal of treatment and discuss progress on daily workbooks.  Participation Level:  Active  Participation Quality:  Appropriate  Affect:  Appropriate  Cognitive:  Alert and Oriented  Insight: Good  Engagement in Group:  Engaged  Modes of Intervention:  Discussion  Additional Comments:  Pt rated her day a 7/10. Pt stated her day got better once she transferred here from the emergency department. Pt states she feels more comfortable here, but is still trying to get used to everything. Her goal for tomorrow is to learn how to control her anger.  Leo GrosserMegan A Hawken Bielby 04/10/2017, 9:03 PM

## 2017-04-10 NOTE — [facility] Group Notes (Signed)
[facility] Group Notes:  (Nursing/MHT/Case Management/Adjunct)  Date:  04/10/2017  Time:  4:00 pm  Type of Therapy:  Group Therapy  Participation Level:  Active  Participation Quality:  Appropriate  Affect:  Appropriate  Cognitive:  Appropriate  Insight:  Appropriate  Engagement in Group:  Engaged  Modes of Intervention:  Education  Summary of Progress/Problems:  Patient alert and interacted appropriately with group members.    Earline MayotteKnight, Suellen Durocher Shephard 04/10/2017, 5:21 PM

## 2017-04-10 NOTE — Tx Team (Signed)
Initial Treatment Plan 04/10/2017 3:19 PM Jody Carter [id number]:[id number]    PATIENT STRESSORS: Financial difficulties Marital or family conflict Medication change or noncompliance Occupational concerns Substance abuse   PATIENT STRENGTHS: Ability for insight Average or above average intelligence Capable of independent living Communication skills General fund of knowledge Motivation for treatment/growth Supportive family/friends   PATIENT IDENTIFIED PROBLEMS: "depression"  "anxious"  "panic attacks"  "suicidal thoughts"               DISCHARGE CRITERIA:  Ability to meet basic life and health needs Adequate post-discharge living arrangements Improved stabilization in mood, thinking, and/or behavior Medical problems require only outpatient monitoring Motivation to continue treatment in a less acute level of care Need for constant or close observation no longer present Reduction of life-threatening or endangering symptoms to within safe limits Safe-care adequate arrangements made Verbal commitment to aftercare and medication compliance Withdrawal symptoms are absent or subacute and managed without 24-hour nursing intervention  PRELIMINARY DISCHARGE PLAN: Attend aftercare/continuing care group Attend PHP/IOP Attend 12-step recovery group Outpatient therapy Participate in family therapy Return to previous living arrangement Return to previous work or school arrangements  PATIENT/FAMILY INVOLVEMENT: This treatment plan has been presented to and reviewed with the patient, Jody Naomie DeanL Broker..  The patient and family have been given the opportunity to ask questions and make suggestions.  Quintella ReichertKnight, Sarahy Creedon BurleighShephard, RN 04/10/2017, 3:19 PM

## 2017-04-10 NOTE — Progress Notes (Signed)
D    Pt is anxious and depressed    She reports trouble sleeping and wanted a medication for sleep    She is pleasant on approach but has minimal interaction with others   She contracts for safety presently  A   Verbal support given   Medications administered and effectiveness monitored    Q 15 min checks  R    Pt is safe at present time

## 2017-04-10 NOTE — ED Notes (Signed)
Telepsych assessment in progress at this time. 

## 2017-04-10 NOTE — ED Notes (Signed)
[facility] called and believes pt still meets inpatient criteria and will have a bed available today at 1300. Accepting MD is Leighton RuffIna Okonkwo and attending MD is Dr. Emeline DarlingIze.

## 2017-04-10 NOTE — BH Assessment (Signed)
[facility] Assessment Progress Note     TTS Reassessment:  Patient states that she is feeling better today and states that she has had a lot of time to think about things.  Patient states that she is not suicidal and states that she has never tried to kill herself.  Patient states that she has too much to live for.  Patient states: "I don't want to make a permanent decision due to a temporary situation."  Patient states that she is in a relationship that is very difficult for her.  Patient states that she got married in December and that she and her husband were having problems weeks before her marriage and they separated prior to the wedding.  Patient states that she was pregnant  and found out that her husband was with someone else, so she aborted the baby and she states that she thinks he is angry with her about that decision because he was not involved with it.  Patient states that she lives and works with her husband and she has no time for herself.  Patient states that they argue a lot and she states that she believes that her husband has issues too.  Patient states that she has made statements about hurting herself in the past to get attention, but states that she has never acted on them.  Patient states that she believes that she is bipolar and states that she feels like she is on the wrong medication.  Patient [facility] and states that she has an appointment on 3/26 and states that she plans to talk to the doctor about medication changes.  Patient states that she feels safe to go home, but states that she thinks that she needs to stay with her mother for awhile instead of returning home to live with her husband until she gets stable on medications.  Patient does not feel like she would benefit from hospitalization.  Will staff case with provider for final disposition.

## 2017-04-10 NOTE — Progress Notes (Addendum)
[facility], but patient denied any drug use during admission process. Patient denies SI, HI, and A/V hallucinations.
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
## 2017-04-10 NOTE — Plan of Care (Signed)
Nurse discussed depression, anxiety, coping skills with patient.  

## 2017-04-10 NOTE — ED Notes (Signed)
[facility] at [facility].

## 2017-04-10 NOTE — BH Assessment (Addendum)
Per Leighton Ruffina Okonkwo, FNP,  Inpatient treatment is recommended

## 2017-04-11 DIAGNOSIS — Z62810 Personal history of physical and sexual abuse in childhood: Secondary | ICD-10-CM

## 2017-04-11 DIAGNOSIS — R450 Nervousness: Secondary | ICD-10-CM

## 2017-04-11 DIAGNOSIS — F3160 Bipolar disorder, current episode mixed, unspecified: Secondary | ICD-10-CM

## 2017-04-11 DIAGNOSIS — F4010 Social phobia, unspecified: Secondary | ICD-10-CM

## 2017-04-11 DIAGNOSIS — Z813 Family history of other psychoactive substance abuse and dependence: Secondary | ICD-10-CM

## 2017-04-11 DIAGNOSIS — F319 Bipolar disorder, unspecified: Secondary | ICD-10-CM | POA: Diagnosis present

## 2017-04-11 DIAGNOSIS — F419 Anxiety disorder, unspecified: Secondary | ICD-10-CM

## 2017-04-11 DIAGNOSIS — Z818 Family history of other mental and behavioral disorders: Secondary | ICD-10-CM

## 2017-04-11 LAB — TSH: TSH: 1.974 u[IU]/mL (ref 0.350–4.500)

## 2017-04-11 MED ORDER — ARIPIPRAZOLE 5 MG PO TABS
5.0000 mg | ORAL_TABLET | Freq: Once | ORAL | Status: AC
  Administered 2017-04-11: 5 mg via ORAL
  Filled 2017-04-11 (×2): qty 1

## 2017-04-11 MED ORDER — ARIPIPRAZOLE 10 MG PO TABS
10.0000 mg | ORAL_TABLET | Freq: Every day | ORAL | Status: DC
  Administered 2017-04-12: 10 mg via ORAL
  Filled 2017-04-11 (×3): qty 1

## 2017-04-11 NOTE — Tx Team (Signed)
Interdisciplinary Treatment and Diagnostic Plan Update  04/11/2017 Time of Session: [place] MRN: [id number]  Principal Diagnosis: <principal problem not specified>  Secondary Diagnoses: Active Problems:   MDD (major depressive disorder), single episode, severe , no psychosis ([place])   Current Medications:  Current Facility-Administered Medications  Medication Dose Route Frequency Provider Last Rate Last Dose  . alum & mag hydroxide-simeth (MAALOX/MYLANTA) 200-200-20 MG/5ML suspension 30 mL  30 mL Oral Q4H PRN Okonkwo, Justina A, NP      . escitalopram (LEXAPRO) tablet 10 mg  10 mg Oral Daily Okonkwo, Justina A, NP   10 mg at 04/11/17 0811  . hydrOXYzine (ATARAX/VISTARIL) tablet 25 mg  25 mg Oral TID PRN Lu Duffel, Justina A, NP   25 mg at 04/11/17 0817  . ibuprofen (ADVIL,MOTRIN) tablet 600 mg  600 mg Oral Q6H PRN Okonkwo, Justina A, NP   600 mg at 04/11/17 0816  . levothyroxine (SYNTHROID, LEVOTHROID) tablet 88 mcg  88 mcg Oral QAC breakfast Okonkwo, Justina A, NP   88 mcg at 04/11/17 0814  . magnesium hydroxide (MILK OF MAGNESIA) suspension 30 mL  30 mL Oral Daily PRN Okonkwo, Justina A, NP      . pantoprazole (PROTONIX) EC tablet 40 mg  40 mg Oral Daily Okonkwo, Justina A, NP   40 mg at 04/11/17 0811  . traZODone (DESYREL) tablet 50 mg  50 mg Oral QHS PRN Lu Duffel, Justina A, NP   50 mg at 04/10/17 2147   PTA Medications: Medications Prior to Admission  Medication Sig Dispense Refill Last Dose  . escitalopram (LEXAPRO) 10 MG tablet Take 1 tablet by mouth daily.     Marland Kitchen ibuprofen (ADVIL,MOTRIN) 200 MG tablet Take 600 mg by mouth every 6 (six) hours as needed for mild pain.   prn  . levothyroxine (SYNTHROID, LEVOTHROID) 88 MCG tablet Take 88 mcg by mouth daily before breakfast.    12/17/2016 at Unknown time  . omeprazole (PRILOSEC) 20 MG capsule Take 1 capsule by mouth daily.       Patient Stressors: Financial difficulties Marital or family conflict Medication change or  noncompliance Occupational concerns Substance abuse  Patient Strengths: Ability for insight Average or above average intelligence Capable of independent living Curator fund of knowledge Motivation for treatment/growth Supportive family/friends  Treatment Modalities: Medication Management, Group therapy, Case management,  1 to 1 session with clinician, Psychoeducation, Recreational therapy.   Physician Treatment Plan for Primary Diagnosis: <principal problem not specified>  Medication Management: Evaluate patient's response, side effects, and tolerance of medication regimen.  Therapeutic Interventions: 1 to 1 sessions, Unit Group sessions and Medication administration.  Evaluation of Outcomes: Not Met  Physician Treatment Plan for Secondary Diagnosis: Active Problems:   MDD (major depressive disorder), single episode, severe , no psychosis ([place])     Medication Management: Evaluate patient's response, side effects, and tolerance of medication regimen.  Therapeutic Interventions: 1 to 1 sessions, Unit Group sessions and Medication administration.  Evaluation of Outcomes: Not Met   RN Treatment Plan for Primary Diagnosis: <principal problem not specified> Long Term Goal(s): Knowledge of disease and therapeutic regimen to maintain health will improve  Short Term Goals: Ability to remain free from injury will improve, Ability to verbalize frustration and anger appropriately will improve, Ability to demonstrate self-control, Ability to participate in decision making will improve, Ability to verbalize feelings will improve, Ability to disclose and discuss suicidal ideas, Ability to identify and develop effective coping behaviors will improve and Compliance with prescribed  medications will improve  Medication Management: RN will administer medications as ordered by provider, will assess and evaluate patient's response and provide education to patient for prescribed  medication. RN will report any adverse and/or side effects to prescribing provider.  Therapeutic Interventions: 1 on 1 counseling sessions, Psychoeducation, Medication administration, Evaluate responses to treatment, Monitor vital signs and CBGs as ordered, Perform/monitor CIWA, COWS, AIMS and Fall Risk screenings as ordered, Perform wound care treatments as ordered.  Evaluation of Outcomes: Not Met   LCSW Treatment Plan for Primary Diagnosis: <principal problem not specified> Long Term Goal(s): Safe transition to appropriate next level of care at discharge, Engage patient in therapeutic group addressing interpersonal concerns.  Short Term Goals: Engage patient in aftercare planning with referrals and resources, Increase social support, Increase ability to appropriately verbalize feelings, Increase emotional regulation, Facilitate acceptance of mental health diagnosis and concerns, Facilitate patient progression through stages of change regarding substance use diagnoses and concerns, Identify triggers associated with mental health/substance abuse issues and Increase skills for wellness and recovery  Therapeutic Interventions: Assess for all discharge needs, 1 to 1 time with Social worker, Explore available resources and support systems, Assess for adequacy in community support network, Educate family and significant other(s) on suicide prevention, Complete Psychosocial Assessment, Interpersonal group therapy.  Evaluation of Outcomes: Not Met   Progress in Treatment: Attending groups: Yes. Participating in groups: Yes. Taking medication as prescribed: Yes. Toleration medication: Yes. Family/Significant other contact made: No, will contact:  CSW will continue to assess Patient understands diagnosis: Yes. Discussing patient identified problems/goals with staff: Yes. Medical problems stabilized or resolved: Yes. Denies suicidal/homicidal ideation: No. Issues/concerns per patient  self-inventory: No. Other:   New problem(s) identified: None  New Short Term/Long Term Goal(s): medication stabilization, elimination of SI thoughts, development of comprehensive mental wellness plan.   Patient Goals:  Discharge Plan or Barriers: CSW will continue to assess for an appropriate discharge plan.   Reason for Continuation of Hospitalization: Anxiety Depression Medication stabilization Suicidal ideation  Estimated Length of Stay: Thursday, 04/14/17  Attendees: Patient: Jody Carter 04/11/2017 8:44 AM  Physician: Dr. Leanord Hawking 04/11/2017 8:44 AM  Nursing: Grayland Ormond, RN 04/11/2017 8:44 AM  RN Care Manager: Rhunette Croft 04/11/2017 8:44 AM  Social Worker: Radonna Ricker, [place] 04/11/2017 8:44 AM  Recreational Therapist: X 04/11/2017 8:44 AM  Other: X 04/11/2017 8:44 AM  Other: X 04/11/2017 8:44 AM  Other: Rhunette Croft 04/11/2017 8:44 AM    Scribe for Treatment Team: Marylee Floras, [place] 04/11/2017 8:44 AM

## 2017-04-11 NOTE — Progress Notes (Signed)
Recreation Therapy Notes  Date: 3.18.19 Time: 9:30 a.m. Location: [address] Dayroom  Group Topic: Stress Management  Goal Area(s) Addresses:  Goal 1.1: To reduce stress  -Patient will report feeling a reduction in stress level  -Patient will identify the importance of stress management  -Patient will participate during stress management group treatment    Behavioral Response: Engaged  Intervention: Stress Management  Activity: Meditattion- Patients were in a peaceful environment with soft lighting enhancing patients mood.   Education: Stress Management, Discharge Planning.   Education Outcome: Acknowledges edcuation/In group clarification offered/Needs additional education  Clinical Observations/Feedback:: Patient attended and participated appropriately during stress management group treatment. Patient reported feeling a reduction in stress level.   Tanyia Grabbe, Recreation Therapy Intern   Jody Carter 04/11/2017 12:39 PM 

## 2017-04-11 NOTE — Progress Notes (Signed)
D: Pt denies SI/HI/AVH. Pt is pleasant and cooperative. Pt stated she was better due to being in better environment and the medications.   A: Pt was offered support and encouragement. Pt was given scheduled medications. Pt was encourage to attend groups. Q 15 minute checks were done for safety.  R:Pt attends groups and interacts well with peers and staff. Pt is taking medication. Pt has no complaints.Pt receptive to treatment and safety maintained on unit.

## 2017-04-11 NOTE — Progress Notes (Signed)
Nutrition Brief Note  Patient identified on the Malnutrition Screening Tool (MST) Report  Wt Readings from Last 15 Encounters:  04/10/17 135 lb (61.2 kg)  04/09/17 134 lb (60.8 kg)  01/30/17 138 lb (62.6 kg)  12/17/16 130 lb (59 kg)  10/26/16 136 lb (61.7 kg)  08/08/15 140 lb (63.5 kg) (72 %, Z= 0.59)*  04/13/15 136 lb (61.7 kg) (68 %, Z= 0.48)*  02/18/14 183 lb (83 kg) (96 %, Z= 1.76)*  02/12/14 186 lb (84.4 kg) (96 %, Z= 1.81)*  01/16/14 175 lb 6.4 oz (79.6 kg) (95 %, Z= 1.63)*  01/10/14 175 lb 3 oz (79.5 kg) (95 %, Z= 1.63)*  10/14/13 155 lb (70.3 kg) (89 %, Z= 1.21)*  09/25/13 147 lb (66.7 kg) (84 %, Z= 0.99)*  08/23/13 141 lb 6.4 oz (64.1 kg) (79 %, Z= 0.82)*  08/22/13 141 lb (64 kg) (79 %, Z= 0.81)*   * Growth percentiles are based on CDC (Girls, 2-20 Years) data.    Body mass index is 24.69 kg/m. Patient meets criteria for normal weight based on current BMI. Skin WDL. Pt admitted for depression, anxiety, and SI.   Current diet order is Regular and pt is eating as desired for meals and snacks. Labs and medications reviewed.   No nutrition interventions warranted at this time. If nutrition issues arise, please consult RD.      Trenton GammonJessica Velera [place], MS, RD, LDN, [facility] Inpatient Clinical Dietitian Pager # [phone] After hours/weekend pager # [phone]

## 2017-04-11 NOTE — Progress Notes (Signed)
D:  Patient's self inventory sheet, patient sleeps good, sleep medication helpful.  Good appetite, normal energy level, good concentration.  Denied depression and hopeless, rated anxiety #2.  Denied withdrawals.  Denied SI.   Physical problems, headaches.  Denied physical pain.  Denied pain medicine.  Goal is talk about anger issues.  Plans to attend groups and talk to MD about meds and discharge.  No discharge plans. A:  Medications administered per MD orders.  Emotional support and encouragement given patient. R:  Denied SI and HI, contracts for safety.  Denied A/V hallucinations.  Safety maintained with 15 minute checks. .Marland Kitchen

## 2017-04-11 NOTE — [facility] Group Notes (Signed)
[facility] LCSW Group Therapy Note  Date/Time: 04/11/17, 1315  Type of Therapy and Topic:  Group Therapy:  Overcoming Obstacles  Participation Level:  active  Description of Group:    In this group patients will be encouraged to explore what they see as obstacles to their own wellness and recovery. They will be guided to discuss their thoughts, feelings, and behaviors related to these obstacles. The group will process together ways to cope with barriers, with attention given to specific choices patients can make. Each patient will be challenged to identify changes they are motivated to make in order to overcome their obstacles. This group will be process-oriented, with patients participating in exploration of their own experiences as well as giving and receiving support and challenge from other group members.  Therapeutic Goals: 1. Patient will identify personal and current obstacles as they relate to admission. 2. Patient will identify barriers that currently interfere with their wellness or overcoming obstacles.  3. Patient will identify feelings, thought process and behaviors related to these barriers. 4. Patient will identify two changes they are willing to make to overcome these obstacles:    Summary of Patient Progress:  Pt identified low self esteem, depression, and anxiety as obstacles in her life.  Pt was quite open in sharing her story with the group and showed very good participation in the group discussion.      Therapeutic Modalities:   Cognitive Behavioral Therapy Solution Focused Therapy Motivational Interviewing Relapse Prevention Therapy  Daleen SquibbGreg Gerrica Cygan, LCSW

## 2017-04-11 NOTE — Progress Notes (Signed)
Adult Psychoeducational Group Note  Date:  04/11/2017 Time:  8:36 PM  Group Topic/Focus:  Wrap-Up Group:   The focus of this group is to help patients review their daily goal of treatment and discuss progress on daily workbooks.  Participation Level:  Active  Participation Quality:  Appropriate  Affect:  Appropriate  Cognitive:  Alert  Insight: Appropriate  Engagement in Group:  Engaged  Modes of Intervention:  Discussion  Additional Comments:  Patient stated having a good day. Patient's goal for today was to talk to someone about her anger management. Patient stated she spoke with the doctor, and he changed her medication.   Mardy Hoppe L Vedanshi Massaro 04/11/2017, 8:36 PM
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
## 2017-04-11 NOTE — Plan of Care (Signed)
  Safety: Periods of time without injury will increase 04/11/2017 2347 - Progressing by Delos HaringPhillips, Roiza Wiedel A, RN Note Pt safe on the unit at this time

## 2017-04-11 NOTE — Progress Notes (Signed)
Adult Psychoeducational Group Note  Date:  04/11/2017 Time:  11:44 AM  Group Topic/Focus:  Building Self Esteem:   The Focus of this group is helping patients become aware of the effects of self-esteem on their lives, the things they and others do that enhance or undermine their self-esteem, seeing the relationship between their level of self-esteem and the choices they make and learning ways to enhance self-esteem.  Participation Level:  Active  Participation Quality:  Appropriate  Affect:  Appropriate  Cognitive:  Alert  Insight: Appropriate, Good and Improving  Engagement in Group:  Engaged  Modes of Intervention:  Activity and Discussion  Additional Comments:  Pt attended group and participated in activity and discussion.  Hamed Debella R Jenisa Monty 04/11/2017, 11:44 AM

## 2017-04-11 NOTE — Progress Notes (Signed)
Adult Psychoeducational Group Note  Date:  04/11/2017 Time:  4:15 PM  Group Topic/Focus:  Healthy Communication:   The focus of this group is to discuss communication, barriers to communication, as well as healthy ways to communicate with others.  Participation Level:  Active  Participation Quality:  Appropriate and Sharing  Affect:  Flat  Cognitive:  Alert and Appropriate  Insight: Improving  Engagement in Group:  Developing/Improving  Modes of Intervention:  Discussion and Support  Additional Comments:  Patient tossed a ball around to members of the group and answered questions. Patient identified importance of getting to know peers, themselves and build communication skills.  De Jaworski A Shaunette Gassner 04/11/2017, 4:45 PM 

## 2017-04-11 NOTE — Plan of Care (Signed)
Nurse discussed depression, anxiety, coping skills with patient.  

## 2017-04-12 DIAGNOSIS — E039 Hypothyroidism, unspecified: Secondary | ICD-10-CM

## 2017-04-12 DIAGNOSIS — X789XXA Intentional self-harm by unspecified sharp object, initial encounter: Secondary | ICD-10-CM

## 2017-04-12 DIAGNOSIS — G4700 Insomnia, unspecified: Secondary | ICD-10-CM

## 2017-04-12 DIAGNOSIS — R45851 Suicidal ideations: Secondary | ICD-10-CM

## 2017-04-12 MED ORDER — ARIPIPRAZOLE 10 MG PO TABS
10.0000 mg | ORAL_TABLET | Freq: Every day | ORAL | Status: DC
  Filled 2017-04-12 (×2): qty 1

## 2017-04-12 NOTE — [facility] Suicide Risk Assessment (Signed)
[facility] INPATIENT:  Family/Significant Other Suicide Prevention Education  Suicide Prevention Education:  Education Completed; Francesco SorJoshua McGee, husband, [phone], has been identified by the patient as the family member/significant other with whom the patient will be residing, and identified as the person(s) who will aid the patient in the event of a mental health crisis (suicidal ideations/suicide attempt).  With written consent from the patient, the family member/significant other has been provided the following suicide prevention education, prior to the and/or following the discharge of the patient.  The suicide prevention education provided includes the following:  Suicide risk factors  Suicide prevention and interventions  National Suicide Hotline telephone number  [facility] assessment telephone number  [facility] Emergency Assistance 911  [facility] and/or Residential Mobile Crisis Unit telephone number  Request made of family/significant other to:  Remove weapons (e.g., guns, rifles, knives), all items previously/currently identified as safety concern.  Husband has guns but he keeps them at his mother's house.  No guns in the home.  Remove drugs/medications (over-the-counter, prescriptions, illicit drugs), all items previously/currently identified as a safety concern.  The family member/significant other verbalizes understanding of the suicide prevention education information provided.  The family member/significant other agrees to remove the items of safety concern listed above.  Ivin BootyJoshua said pt started Lexapro in December, had a number of side effects but the MD would not really work with her to make any changes.  He thinks pt needs to get her medication "right."  He said pt has stress from her mother and typical things.  Ivin BootyJoshua said he is not sure how pt broke the mirror prior to admission.  He came in and found it already smashed and was worried pt was trying  to hurt herself.    Lorri FrederickWierda, Kanoelani Dobies Jon, LCSW 04/12/2017, 9:58 AM

## 2017-04-12 NOTE — [facility] Group Notes (Signed)
Adult Psychoeducational Group Note  Date:  04/12/2017 Time:  9:37 AM  Group Topic/Focus:  Goals Group:   The focus of this group is to help patients establish daily goals to achieve during treatment and discuss how the patient can incorporate goal setting into their daily lives to aide in recovery.  Participation Level:  Active  Participation Quality:  Appropriate  Affect:  Appropriate  Cognitive:  Alert  Insight: Appropriate  Engagement in Group:  Engaged  Modes of Intervention:  Orientation  Additional Comments: Pt goal for today is to work on discharge plan so [facility].  Dellia NimsJaquesha M Latana Colin 04/12/2017, 9:37 AM

## 2017-04-12 NOTE — Progress Notes (Signed)
Patient denies SI, HI and AVH.  Patient had no somatic complaints this shift.  Patient stated that she slept well last night though it was slightly interrupted.   Assess patient for safety, offer medications as prescribed, engage patient in 1:1 staff talks.   Continue to monitor as prescribed.  Patient able to contract for safety.

## 2017-04-12 NOTE — Progress Notes (Addendum)
[facility] MD Progress Note  04/12/2017 1:45 PM Jody Carter  MRN:  [id number] Subjective:  Patient reports she is feeling better , and feels Abilify has been helpful. She states she feels her mood is more stable and she feels more " relaxed ". She denies current suicidal ideations. As she improves she is starting to focus on discharging soon , and states she is hoping to reunite with her family soon/misses her child. Denies medication side effects other than feeling vaguely sedated on Abilify, and is wanting to switch to QHS dosing .   Objective: I have discussed case with treatment team and have met with patient. 22 y.o emale, married, living with her husband and daughter.  She presented to the ER voluntarily due to depression, SI, self induced lacerations , which she states occurred in the context of a PTSD related memory/flashback. Reports history of mood disorder, and states she has been diagnosed with Bipolar Disorder. Describes episodes of depression, brief , short lived but intense mood swings lasting hours rather than days, and history of increased irritability and agitation on Lexapro trial in the past . Currently reports feeling " a lot better" , more stable, and currently denies SI / HI / AVH/ contracts for safety on the unit.  As above, she states she is tolerating Abilify well, except for some day time sedation. No akathisia. Visible on unit, behavior in good control, pleasant on approach. Labs- TSH WNL.    Principal Problem: Bipolar disorder, unspecified ([place]) Diagnosis:   Patient Active Problem List   Diagnosis Date Noted  . Bipolar disorder, unspecified ([place]) [F31.9] 04/11/2017  . Active labor [IMO0001] 02/18/2014  . Susceptible to varicella (non-immune), currently pregnant [O09.899, Z28.3] 07/03/2013  . Supervision of normal first teen pregnancy [Z34.00] 06/15/2013  . Hx of migraines [Z86.69] 07/31/2012   Total Time spent with patient: 20 minutes  Past Psychiatric History:  As per H&P   Past Medical History:  Past Medical History:  Diagnosis Date  . Anxiety   . Depression   . Hx of migraines   . Thyroid disease     Past Surgical History:  Procedure Laterality Date  . ADENOIDECTOMY    . BREAST SURGERY     augmentation  . EYE SURGERY Bilateral    closed tear ducts opened as child  . TONSILLECTOMY     Family History:  Family History  Problem Relation Age of Onset  . Heart attack Father   . Diabetes Paternal Grandfather   . Hypertension Maternal Grandmother   . Heart attack Maternal Grandmother   . Hypertension Maternal Grandfather   . Heart attack Maternal Grandfather    Family Psychiatric  History: Pt reports Father hx of Bipolar Disorder   Social History:  Social History   Substance and Sexual Activity  Alcohol Use No     Social History   Substance and Sexual Activity  Drug Use No    Social History   Socioeconomic History  . Marital status: Single    Spouse name: None  . Number of children: None  . Years of education: None  . Highest education level: None  Social Needs  . Financial resource strain: None  . Food insecurity - worry: None  . Food insecurity - inability: None  . Transportation needs - medical: None  . Transportation needs - non-medical: None  Occupational History  . None  Tobacco Use  . Smoking status: Never Smoker  . Smokeless tobacco: Never Used  Substance and Sexual Activity  .  Alcohol use: No  . Drug use: No  . Sexual activity: Yes    Birth control/protection: None  Other Topics Concern  . None  Social History Narrative  . None   Additional Social History:    Pain Medications: see MAR Prescriptions: see MAR Over the Counter: see MAR History of alcohol / drug use?: No history of alcohol / drug abuse Longest period of sobriety (when/how long): not drug/alcohol use Negative Consequences of Use: Financial, Personal relationships Withdrawal Symptoms: Tremors, Other (Comment)(denied alcohol/drug  use)  Sleep: fair- improving   Appetite:  Good  Current Medications: Current Facility-Administered Medications  Medication Dose Route Frequency Provider Last Rate Last Dose  . alum & mag hydroxide-simeth (MAALOX/MYLANTA) 200-200-20 MG/5ML suspension 30 mL  30 mL Oral Q4H PRN Okonkwo, Justina A, NP      . ARIPiprazole (ABILIFY) tablet 10 mg  10 mg Oral Daily Money, Travis B, FNP   10 mg at 04/12/17 0737  . hydrOXYzine (ATARAX/VISTARIL) tablet 25 mg  25 mg Oral TID PRN Hughie Closs A, NP   25 mg at 04/11/17 2222  . ibuprofen (ADVIL,MOTRIN) tablet 600 mg  600 mg Oral Q6H PRN Okonkwo, Justina A, NP   600 mg at 04/11/17 0816  . levothyroxine (SYNTHROID, LEVOTHROID) tablet 88 mcg  88 mcg Oral QAC breakfast Okonkwo, Justina A, NP   88 mcg at 04/12/17 4496  . magnesium hydroxide (MILK OF MAGNESIA) suspension 30 mL  30 mL Oral Daily PRN Okonkwo, Justina A, NP      . pantoprazole (PROTONIX) EC tablet 40 mg  40 mg Oral Daily Okonkwo, Justina A, NP   40 mg at 04/12/17 0737  . traZODone (DESYREL) tablet 50 mg  50 mg Oral QHS PRN Lu Duffel, Justina A, NP   50 mg at 04/11/17 2222    Lab Results:  Results for orders placed or performed during the hospital encounter of 04/10/17 (from the past 48 hour(s))  TSH     Status: None   Collection Time: 04/11/17  6:40 PM  Result Value Ref Range   TSH 1.974 0.350 - 4.500 uIU/mL    Comment: Performed by a 3rd Generation assay with a functional sensitivity of <=0.01 uIU/mL. Performed at [facility], [place] [address], [place], [place] [address]     Blood Alcohol level:  Lab Results  Component Value Date   ETH <10 38/46/6599    Metabolic Disorder Labs: No results found for: HGBA1C, MPG No results found for: PROLACTIN No results found for: CHOL, TRIG, HDL, CHOLHDL, VLDL, LDLCALC  Physical Findings: AIMS: Facial and Oral Movements Muscles of Facial Expression: None, normal Lips and Perioral Area: None, normal Jaw: None,  normal Tongue: None, normal,Extremity Movements Upper (arms, wrists, hands, fingers): None, normal Lower (legs, knees, ankles, toes): None, normal, Trunk Movements Neck, shoulders, hips: None, normal, Overall Severity Severity of abnormal movements (highest score from questions above): None, normal Incapacitation due to abnormal movements: None, normal Patient's awareness of abnormal movements (rate only patient's report): No Awareness, Dental Status Current problems with teeth and/or dentures?: No Does patient usually wear dentures?: No  CIWA:  CIWA-Ar Total: 4 COWS:  COWS Total Score: 5  Musculoskeletal: Strength & Muscle Tone: within normal limits Gait & Station: normal Patient leans: N/A  Psychiatric Specialty Exam: Physical Exam  Constitutional: She is oriented to person, place, and time. She appears well-developed and well-nourished.  HENT:  Head: Normocephalic and atraumatic.  Respiratory: Effort normal.  Musculoskeletal: Normal range of motion.  Neurological:  She is alert and oriented to person, place, and time.  Skin: Skin is warm and dry.  Psychiatric:  See Below:     Review of Systems  Constitutional: Negative.   HENT: Negative.   Eyes: Negative.   Respiratory: Negative for cough.   Cardiovascular: Negative.   Gastrointestinal: Negative.   Genitourinary: Negative.   Musculoskeletal: Negative.   Skin: Negative.   Neurological: Negative.   Endo/Heme/Allergies: Negative.   Psychiatric/Behavioral: Negative for depression, hallucinations and suicidal ideas. Nervous/anxious: improving    no nausea, no vomiting .  Blood pressure 115/60, pulse (!) 109, temperature 98.1 F (36.7 C), temperature source Oral, resp. rate 16, height 5' 2" (1.575 m), weight 61.2 kg (135 lb), last menstrual period 03/11/2017, SpO2 100 %, not currently breastfeeding.Body mass index is 24.69 kg/m.  General Appearance: improving grooming  Eye Contact:  Good  Speech:  Normal Rate  Volume:   Normal  Mood:  reports mood is improving, feeling better   Affect:  less labile, more reactive, not irritable or expansive at this time  Thought Process:  Goal Directed and Linear  Orientation:  Full (Time, Place, and Person)  Thought Content: No AV Hallucinations. No Delusions. Not internally preoccupied   Suicidal Thoughts:  No Denies SI or self injurious ideations and identifies love for her child as protective factor against hurting self  . Denies HI . Contracts for safety on the unit .  Homicidal Thoughts:  No  Memory:  recent and remote grossly intact   Judgement:  Other:  improving   Insight:  improving   Psychomotor Activity:  Normal - reported feeling tremulous earlier , but at this time no tremors or psychomotor agitation is apparent    Concentration:  Concentration: Good and Attention Span: Good  Recall:  Good  Fund of Knowledge:  Good  Language:  Good  Akathisia:  No  Handed:    AIMS (if indicated):     Assets:  Communication Skills Desire for Improvement Financial Resources/Insurance Housing Intimacy Physical Health  ADL's:  Intact  Cognition:  WNL  Sleep:  Number of Hours: 5   Assessment - patient reports partial but significant improvement compared to how she felt prior to admission- reports improving mood , decreased sense of mood lability/mood swings, denies SI , and reports she feels Abilify is helping . Reported some anxiety and tremulousness earlier, but currently calm, without any psychomotor agitation . Prefers Abilify at QHS to minimize sedation. As she improves she is focusing more on discharge.    Treatment Plan Summary: Daily contact with patient to assess and evaluate symptoms and progress in treatment, Medication management and Plan :   PLAN: Treatment Plan reviewed as below today 3/19 Encourage group and milieu participation to work on coping skills and symptom reduction. Change Abilify to 10 mgrs QHS to minimize sedation and day time side  effects. Continue Synthroid for history of hypothyroidism Continue Trazodone 50 mgrs QHS PRN for insomnia as needed  Continue Vistaril 25 mgrs Q 8 hours PRN for anxiety   Check HgbA1C, Lipid Panel as on atypical antipsychotic management Treatment team working on disposition planning options    Neita Garnet, MD  Patient ID: Abigail Miyamoto, female   DOB: 1995/12/27, 22 y.o.   MRN: [id number]

## 2017-04-12 NOTE — [facility] Group Notes (Signed)
Adult Psychoeducational Group Note  Date:  04/12/2017 Time:  11:18 PM  Group Topic/Focus:  Wrap-Up Group:   The focus of this group is to help patients review their daily goal of treatment and discuss progress on daily workbooks.  Participation Level:  Active  Participation Quality:  Appropriate and Attentive  Affect:  Appropriate  Cognitive:  Alert and Appropriate  Insight: Appropriate and Good  Engagement in Group:  Engaged  Modes of Intervention:  Discussion and Education  Additional Comments:  Pt attended and participated in wrap up group this evening. Pt had a good day, due to them getting their discharge date of 3/20.   Chrisandra NettersOctavia A Ladarryl Wrage 04/12/2017, 11:18 PM

## 2017-04-12 NOTE — [facility] Group Notes (Signed)
LCSW Group Therapy Note 04/12/2017 11:42 AM  Type of Therapy/Topic: Group Therapy: Feelings about Diagnosis  Participation Level: Active   Description of Group:  This group will allow patients to explore their thoughts and feelings about diagnoses they have received. Patients will be guided to explore their level of understanding and acceptance of these diagnoses. Facilitator will encourage patients to process their thoughts and feelings about the reactions of others to their diagnosis and will guide patients in identifying ways to discuss their diagnosis with significant others in their lives. This group will be process-oriented, with patients participating in exploration of their own experiences, giving and receiving support, and processing challenge from other group members.  Therapeutic Goals: 1. Patient will demonstrate understanding of diagnosis as evidenced by identifying two or more symptoms of the disorder 2. Patient will be able to express two feelings regarding the diagnosis 3. Patient will demonstrate their ability to communicate their needs through discussion and/or role play  Summary of Patient Progress:  Jody Carter was engaged and participated during the group discussion. She contributed and stated that she felt relieved when she learned she was diagnosed with a mental illness. Jody Carter reports that once she was diagnosed she was able to answer why she had certain behaviors and thought processes. Jody Carter reports that her mental health diagnosis does not define her characteristics, however it does provide an explanation for some of her characteristics. Jody Carter reports that she plans to be more honest with her family and plans to become more aware of her thoughts and how she reacts.    Therapeutic Modalities:  Cognitive Behavioral Therapy Brief Therapy Feelings Identification    Jody Carter Jody Carter LCSWA Clinical Social Worker

## 2017-04-13 LAB — LIPID PANEL
CHOL/HDL RATIO: 3.5 ratio
Cholesterol: 155 mg/dL (ref 0–200)
HDL: 44 mg/dL (ref >40)
LDL Cholesterol: 89 mg/dL (ref 0–99)
Triglycerides: 110 mg/dL (ref <150)
VLDL: 22 mg/dL (ref 0–40)

## 2017-04-13 LAB — HEMOGLOBIN A1C
Hgb A1c MFr Bld: 4.6 % — ABNORMAL LOW (ref 4.8–5.6)
MEAN PLASMA GLUCOSE: 85.32 mg/dL

## 2017-04-13 MED ORDER — ARIPIPRAZOLE 10 MG PO TABS: 10.0000 mg | ORAL_TABLET | Freq: Every day | ORAL | 0 refills | Status: DC

## 2017-04-13 MED ORDER — LEVOTHYROXINE SODIUM 88 MCG PO TABS: 88.0000 ug | ORAL_TABLET | Freq: Every day | ORAL | 0 refills | Status: DC

## 2017-04-13 MED ORDER — HYDROXYZINE HCL 25 MG PO TABS: 25.0000 mg | ORAL_TABLET | Freq: Three times a day (TID) | ORAL | 0 refills | Status: DC | PRN

## 2017-04-13 MED ORDER — TRAZODONE HCL 50 MG PO TABS: 50.0000 mg | ORAL_TABLET | Freq: Every evening | ORAL | 0 refills | Status: DC | PRN
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
## 2017-04-13 NOTE — [facility] Group Notes (Signed)
[facility] Mental [facility] [employer] Group Therapy 04/13/2017 1:15pm  Type of Therapy: Mental Health Association Presentation  Participation Level: Active  Participation Quality: Attentive  Affect: Appropriate  Cognitive: Oriented  Insight: Developing/Improving  Engagement in Therapy: Engaged  Modes of Intervention: Discussion, Education and Socialization  Summary of Progress/Problems: Mental Health Association (MHA) Speaker came to talk about his personal journey with mental health. The pt processed ways by which to relate to the speaker. MHA speaker provided handouts and educational information pertaining to groups and services offered by the MHA. Pt was engaged in speaker's presentation and was receptive to resources provided.    Gabbie Marzo N Smart, LCSW 04/13/2017 9:15 AM  

## 2017-04-13 NOTE — Progress Notes (Signed)
D: Pt denies SI/HI/AVH. Pt is pleasant and cooperative. Pt stated she was doing better tonight due to the meds  A: Pt was offered support and encouragement. Pt was given scheduled medications. Pt was encourage to attend groups. Q 15 minute checks were done for safety.  R:Pt attends groups and interacts well with peers and staff. Pt is taking medication. Pt has no complaints.Pt receptive to treatment and safety maintained on unit.

## 2017-04-13 NOTE — Progress Notes (Signed)
Recreation Therapy Notes  Date:3.20.19 Time:9:30 a.m. Location: [address] Dayroom  Group Topic: Stress Management  Goal Area(s) Addresses:  Goal 1.1: To reduce stress  -Patient will report feeling a reduction in stress level  -Patient will identify the importance of stress management  -Patient will participate during stress management group treatment    Intervention: Stress Management  Activity: Guided Imagery- Patients were in a peaceful environment with soft lighting enhancing patients mood. Patients were read a guided imagery script to promote relaxation   Education:Stress Management, Discharge Planning.   Education Outcome:Acknowledges edcuation/In group clarification offered/Needs additional education  Clinical Observations/Feedback::Patient did not attend   Sheryle Hailarian Raymone Pembroke, Recreation Therapy Intern   Sheryle HailDarian Kandance Yano 04/13/2017 11:20 AM

## 2017-04-13 NOTE — Progress Notes (Signed)
Discharge Note  D) Patient discharged to lobby on 04/13/2017 1300 per MD order. Patient states readiness for discharge. Patient denies SI/HI, AVH and is not delusional or psychotic. Patient in no acute distress.  A) Written and verbal discharge instructions given to the patient. Patient accepting and verbalized understanding. Patient agrees to the discharge plan. Patient discharged with follow-up appointments, prescriptions and belongings. Patient had no belongings in her locker. Patient cooperative and pleasant during discharge process. Opportunity for questions and concerns presented to patient. Patient denied any further questions or concerns. Patient signed for return of belongings.   R) Patient safely escorted to the lobby.

## 2017-04-13 NOTE — [facility] Suicide Risk Assessment (Signed)
[facility] Discharge Suicide Risk Assessment   Principal Problem: Bipolar disorder, unspecified [facility]) Discharge Diagnoses:  Patient Active Problem List   Diagnosis Date Noted  . Bipolar disorder, unspecified (HCC) [F31.9] 04/11/2017  . Active labor [IMO0001] 02/18/2014  . Susceptible to varicella (non-immune), currently pregnant [O09.899, Z28.3] 07/03/2013  . Supervision of normal first teen pregnancy [Z34.00] 06/15/2013  . Hx of migraines [Z86.69] 07/31/2012    Total Time spent with patient: 30 minutes  Musculoskeletal: Strength & Muscle Tone: within normal limits Gait & Station: normal Patient leans: N/A  Psychiatric Specialty Exam: ROS  Denies headache,  no chest pain, no shortness of breath, no nausea, no vomiting,   Blood pressure 99/63, pulse 99, temperature 98.3 F (36.8 C), temperature source Oral, resp. rate 16, height 5\' 2"  (1.575 m), weight 61.2 kg (135 lb), last menstrual period 03/11/2017, SpO2 100 %, not currently breastfeeding.Body mass index is 24.69 kg/m.  General Appearance: Well Groomed  Eye Contact::  Good  Speech:  Normal Rate409  Volume:  Normal  Mood:  states mood much improved, describes mood as 10/10  Affect:  Appropriate and Full Range  Thought Process:  Linear and Descriptions of Associations: Intact  Orientation:  Full (Time, Place, and Person)  Thought Content:  No hallucinations, no delusions, not internally preoccupied   Suicidal Thoughts:  No denies suicidal or self injurious ideations, denies any homicidal or violent ideations   Homicidal Thoughts:  No  Memory:  recent and remote grossly intact   Judgement:  Other:  improving   Insight:  improving   Psychomotor Activity:  Normal  Concentration:  Good  Recall:  Good  Fund of Knowledge:Good  Language: Good  Akathisia:  Negative  Handed:  Right  AIMS (if indicated):   no abnormal or involuntary movements noted or reported   Assets:  Architect Resilience   Sleep:  Number of Hours: 6.75  Cognition: WNL  ADL's:  Intact   Mental Status Per Nursing Assessment::   On Admission:  NA  Demographic Factors:  22 year old married female, has one daughter aged 68 years old who is currently with mother , unemployed   Loss Factors: Relationship stressors   Historical Factors: Reports history of depression, anxiety, prior diagnosis of Bipolar Disorder. States she has never attempted suicide .  Risk Reduction Factors:   Responsible for children under 53 years of age, Sense of responsibility to family, Living with another person, especially a relative, Positive social support and Positive coping skills or problem solving skills  Continued Clinical Symptoms:  At this time patient is alert, attentive, well related, calm, mood is improved and currently presents euthymic, affect is appropriate, reactive, no thought disorder, no suicidal or self injurious ideations, no homicidal or violent ideations, no hallucinations,no delusions, not internally preoccupied . Denies medication side effects. Side effects, including risk of movement disorders, metabolic disorders reviewed . Visible on unit, behavior calm.   Cognitive Features That Contribute To Risk:  No gross cognitive deficits noted upon discharge. Is alert , attentive, and oriented x 3   Suicide Risk:  Mild:  Suicidal ideation of limited frequency, intensity, duration, and specificity.  There are no identifiable plans, no associated intent, mild dysphoria and related symptoms, good self-control (both objective and subjective assessment), few other risk factors, and identifiable protective factors, including available and accessible social support.    Plan Of Care/Follow-up recommendations:  Activity:  as tolerated  Diet:  regular Tests:  NA Other:  See below  Patient  is expressing readiness for discharge and is leaving unit in good spirits  Plans to return home, states husband is going to pick her  up later today [facility] . Craige CottaFernando A Agnes Brightbill, MD 04/13/2017, 11:25 AM

## 2017-04-13 NOTE — Progress Notes (Signed)
  Nursing Progress Note [phone]  Data: Patient presents with pleasant mood. Patient took morning medications without incident. Patient denies SI/HI/AVH or pain. Patient contracts for safety on the unit at this time. Patient completed self-inventory sheet and rate depression, hopelessness, and anxiety 0,0,0, respectively. Patient is visible in the milieu and attending groups.  Action: Patient educated about and provided medication per provider's orders. Patient safety maintained with q15 min safety checks. Low fall risk precautions in place. Emotional support given. 1:1 interaction and active listening provided. Patient encouraged to attend meals and groups. Labs, vital signs and patient behavior monitored throughout shift. Patient encouraged to work on treatment plan and goals.  Response: Patient receptive to interaction with nurse. Patient remains safe on the unit at this time. Patient is appropriate on the unti. Will continue to support and monitor.

## 2017-04-13 NOTE — Progress Notes (Signed)
  [facility] :  Will you be returning to the same living situation after discharge:  Yes,  with husband At discharge, do you have transportation home?: Yes,  husband Do you have the ability to pay for your medications: Yes,  medicaid  Release of information consent forms completed and in the chart;  Patient's signature needed at discharge.  Patient to Follow up at: Follow-up Information    [facility]. Go on 04/15/2017.   Why:  [facility].  Please arrive between 8:00-9:00am.  Please bring photo ID, social security card, and medicaid card. Contact information: [address] [place] [address] [phone]        [facility]. Go on 05/04/2017.   Why:  Please attend your therapy appt with Graylin ShiverVictoria Lewis on Wednesday, 05/04/17, at 9:00am. Contact information: [address] [place] [address] [phone]           Next level of care provider [facility] Link:no  Safety Planning and Suicide Prevention discussed: Yes,  with husband  Have you used any form of tobacco in the last 30 days? (Cigarettes, Smokeless Tobacco, Cigars, and/or Pipes): No  Has patient been referred to the Quitline?: N/A patient is not a smoker  Patient has been referred for addiction treatment: Yes  Lorri FrederickWierda, Kenyotta Dorfman Jon, LCSW 04/13/2017, 1:04 PM

## 2017-06-10 ENCOUNTER — Ambulatory Visit (HOSPITAL_COMMUNITY)
Admission: EM | Admit: 2017-06-10 | Discharge: 2017-06-10 | Disposition: A | Discharge status: Home / Self Care | Payer: Medicaid Other | Attending: Family Medicine | Admitting: Family Medicine

## 2017-06-10 ENCOUNTER — Encounter (HOSPITAL_COMMUNITY): Payer: Self-pay | Admitting: *Deleted

## 2017-06-10 DIAGNOSIS — R05 Cough: Secondary | ICD-10-CM

## 2017-06-10 DIAGNOSIS — R058 Other specified cough: Secondary | ICD-10-CM

## 2017-06-10 MED ORDER — PREDNISONE 20 MG PO TABS: 20.0000 mg | ORAL_TABLET | Freq: Two times a day (BID) | ORAL | 0 refills | Status: DC

## 2017-06-10 MED ORDER — BENZONATATE 100 MG PO CAPS: 100.0000 mg | ORAL_CAPSULE | Freq: Three times a day (TID) | ORAL | 0 refills | Status: DC

## 2017-06-10 NOTE — ED Provider Notes (Signed)
[facility]    CSN: [id number] Arrival date & time: 06/10/17  1802     History   Chief Complaint Chief Complaint  Patient presents with  . Cough  . URI    HPI Jody Carter is a 22 y.o. female.   HPI  Patient is here for cough.  Is been present for weeks.  No fever chills.  No congestion.  No post nasal drip or allergies.  No history of asthma or COPD.  She is not a cigarette smoker.  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  She feels like it is from the dust.  She states that irritated her throat.  No wheezing.  No sputum production.  No chest pain or pressure.  No headache.  Past Medical History:  Diagnosis Date  . Anxiety   . Depression   . Hx of migraines   . Thyroid disease     Patient Active Problem List   Diagnosis Date Noted  . Bipolar disorder, unspecified (HCC) 04/11/2017  . Active labor 02/18/2014  . Susceptible to varicella (non-immune), currently pregnant 07/03/2013  . Supervision of normal first teen pregnancy 06/15/2013  . Hx of migraines 07/31/2012    Past Surgical History:  Procedure Laterality Date  . ADENOIDECTOMY    . BREAST SURGERY     augmentation  . EYE SURGERY Bilateral    closed tear ducts opened as child  . TONSILLECTOMY      OB History    Gravida  1   Para  1   Term  1   Preterm      AB      Living  1     SAB      TAB      Ectopic      Multiple  0   Live Births  1            Home Medications    Prior to Admission medications   Medication Sig Start Date End Date Taking? Authorizing Provider  levothyroxine (SYNTHROID, LEVOTHROID) 88 MCG tablet Take 1 tablet (88 mcg total) by mouth daily before breakfast. For hypothyroidism 04/14/17  Yes Money, Gerlene Burdock, FNP  ARIPiprazole (ABILIFY) 10 MG tablet Take 1 tablet (10 mg total) by mouth at bedtime. For mood control 04/13/17   Money, Gerlene Burdock, FNP  benzonatate (TESSALON) 100 MG capsule Take 1  capsule (100 mg total) by mouth every 8 (eight) hours. 06/10/17   Eustace Moore, MD  predniSONE (DELTASONE) 20 MG tablet Take 1 tablet (20 mg total) by mouth 2 (two) times daily with a meal. 06/10/17   Eustace Moore, MD    Family History Family History  Problem Relation Age of Onset  . Heart attack Father   . Diabetes Paternal Grandfather   . Hypertension Maternal Grandmother   . Heart attack Maternal Grandmother   . Hypertension Maternal Grandfather   . Heart attack Maternal Grandfather     Social History Social History   Tobacco Use  . Smoking status: Current Every Day Smoker    Types: E-cigarettes  . Smokeless tobacco: Never Used  Substance Use Topics  . Alcohol use: No  . Drug use: No     Allergies   Sumatriptan   Review of Systems Review of Systems  Constitutional: Negative for chills, fatigue and fever.  HENT: Negative for congestion and dental problem.   Eyes: Negative for photophobia and  visual disturbance.  Respiratory: Positive for shortness of breath. Negative for wheezing.   Cardiovascular: Negative for chest pain and palpitations.  Gastrointestinal: Negative for nausea and vomiting.  Genitourinary: Negative for dysuria and frequency.  Musculoskeletal: Negative for arthralgias and myalgias.  Neurological: Negative for dizziness and headaches.  Psychiatric/Behavioral: Positive for dysphoric mood. The patient is nervous/anxious.        Troubled marriage     Physical Exam Triage Vital Signs ED Triage Vitals  Enc Vitals Group     BP 06/10/17 1843 116/75     Pulse Rate 06/10/17 1843 (!) 109     Resp 06/10/17 1843 20     Temp 06/10/17 1843 98.9 F (37.2 C)     Temp Source 06/10/17 1843 Oral     SpO2 06/10/17 1843 99 %     Weight --      Height --      Head Circumference --      Peak Flow --      Pain Score 06/10/17 1841 4     Pain Loc --      Pain Edu? --      Excl. in GC? --    No data found.  Updated Vital Signs BP 116/75 (BP  Location: Left Arm)   Pulse (!) 109   Temp 98.9 F (37.2 C) (Oral)   Resp 20   LMP 05/14/2017   SpO2 99%      Physical Exam  Constitutional: She appears well-developed and well-nourished. No distress.  HENT:  Head: Normocephalic and atraumatic.  Right Ear: External ear normal.  Left Ear: External ear normal.  Mouth/Throat: Oropharynx is clear and moist.  Eyes: Conjunctivae are normal.  Neck: Neck supple.  Cardiovascular: Normal rate, regular rhythm and normal heart sounds.  No murmur heard. Pulmonary/Chest: Effort normal and breath sounds normal. No respiratory distress.  Lungs are clear.  Dry cough  Abdominal: Soft. There is no tenderness.  Musculoskeletal: She exhibits no edema.  Neurological: She is alert.  Skin: Skin is warm and dry.  Psychiatric: She has a normal mood and affect.  Nursing note and vitals reviewed.    UC Treatments / Results  Labs (all labs ordered are listed, but only abnormal results are displayed) Labs Reviewed - No data to display  EKG None  Radiology No results found.  Procedures Procedures (including critical care time)  Medications Ordered in UC Medications - No data to display  Initial Impression / Assessment and Plan / UC Course  I have reviewed the triage vital signs and the nursing notes.  Pertinent labs & imaging results that were available during my care of the patient were reviewed by me and considered in my medical decision making (see chart for details).     I discussed with patient I think this is an irritant cough may be some bronchial irritation.  I am going to give her prednisone and Tessalon.  Push fluids.  Avoid dust.  Use a humidifier if she has one.  Consider follow-up with PCP Final Clinical Impressions(s) / UC Diagnoses   Final diagnoses:  Allergic cough     Discharge Instructions     Take the prednisone twice a day for 7 days Take the tessalon for cough Follow up with PCP    ED Prescriptions     Medication Sig Dispense Auth. Provider   benzonatate (TESSALON) 100 MG capsule Take 1 capsule (100 mg total) by mouth every 8 (eight) hours. 21 capsule Eustace Moore, MD  predniSONE (DELTASONE) 20 MG tablet Take 1 tablet (20 mg total) by mouth 2 (two) times daily with a meal. 14 tablet Eustace Moore, MD     Controlled Substance Prescriptions [place] Controlled Substance Registry consulted? Not Applicable   Eustace Moore, MD 06/10/17 2134

## 2017-06-10 NOTE — Discharge Instructions (Addendum)
Take the prednisone twice a day for 7 days Take the tessalon for cough Follow up with PCP

## 2017-06-10 NOTE — ED Triage Notes (Addendum)
Patient reports month long history of progressively worsening cough with associated symptoms of nasal congestion, shortness of breath, head pressure, and sore throat. Patient reports decreased sleep due to constant cough at night.   Patient reports started using Vape about 2 months ago. Education given to patient regarding vaping and possible associated symptoms.   Patient with constant cough during triage.

## 2017-06-27 ENCOUNTER — Encounter (HOSPITAL_BASED_OUTPATIENT_CLINIC_OR_DEPARTMENT_OTHER): Payer: Self-pay | Admitting: *Deleted

## 2017-06-27 ENCOUNTER — Emergency Department (HOSPITAL_BASED_OUTPATIENT_CLINIC_OR_DEPARTMENT_OTHER)
Admission: EM | Admit: 2017-06-27 | Discharge: 2017-06-28 | Disposition: A | Discharge status: Home / Self Care | Payer: Medicaid Other | Attending: Emergency Medicine | Admitting: Emergency Medicine

## 2017-06-27 ENCOUNTER — Other Ambulatory Visit: Payer: Self-pay

## 2017-06-27 DIAGNOSIS — S51812A Laceration without foreign body of left forearm, initial encounter: Secondary | ICD-10-CM | POA: Insufficient documentation

## 2017-06-27 DIAGNOSIS — Z79899 Other long term (current) drug therapy: Secondary | ICD-10-CM | POA: Insufficient documentation

## 2017-06-27 DIAGNOSIS — Y9389 Activity, other specified: Secondary | ICD-10-CM | POA: Diagnosis not present

## 2017-06-27 DIAGNOSIS — W25XXXA Contact with sharp glass, initial encounter: Secondary | ICD-10-CM | POA: Insufficient documentation

## 2017-06-27 DIAGNOSIS — Y929 Unspecified place or not applicable: Secondary | ICD-10-CM | POA: Diagnosis not present

## 2017-06-27 DIAGNOSIS — Y999 Unspecified external cause status: Secondary | ICD-10-CM | POA: Diagnosis not present

## 2017-06-27 DIAGNOSIS — F17290 Nicotine dependence, other tobacco product, uncomplicated: Secondary | ICD-10-CM | POA: Insufficient documentation

## 2017-06-27 MED ORDER — IBUPROFEN 400 MG PO TABS
400.0000 mg | ORAL_TABLET | Freq: Once | ORAL | Status: AC | PRN
  Administered 2017-06-27: 400 mg via ORAL
  Filled 2017-06-27: qty 1

## 2017-06-27 MED ORDER — LIDOCAINE-EPINEPHRINE (PF) 2 %-1:200000 IJ SOLN
INTRAMUSCULAR | Status: AC
  Administered 2017-06-27: 10 mL
  Filled 2017-06-27: qty 10

## 2017-06-27 NOTE — ED Notes (Signed)
Husband at bedside and appears supportive and holding pt's hand.

## 2017-06-27 NOTE — ED Provider Notes (Addendum)
[facility] Provider Note: Jody DellJ. Lane Kylo Gavin, MD, FACEP  CSN: [id number] MRN: [id number] ARRIVAL: 06/27/17 at 2230 ROOM: [facility]   CHIEF COMPLAINT  Laceration   HISTORY OF PRESENT ILLNESS  06/27/17 11:17 PM Jody Carter is a 22 y.o. female states she cut her left volar forearm about an hour ago.  She was taking out all windows with her friend and her arm went through the pain of glass.  She does not believe there is any retained glass.  Her tetanus up-to-date.  There is mild to moderate pain associated with it, worse with palpation.  The left arm is without numbness or weakness distally.  She denies other injury.   Past Medical History:  Diagnosis Date  . Anxiety   . Depression   . Hx of migraines   . Thyroid disease     Past Surgical History:  Procedure Laterality Date  . ADENOIDECTOMY    . BREAST SURGERY     augmentation  . EYE SURGERY Bilateral    closed tear ducts opened as child  . TONSILLECTOMY      Family History  Problem Relation Age of Onset  . Heart attack Father   . Diabetes Paternal Grandfather   . Hypertension Maternal Grandmother   . Heart attack Maternal Grandmother   . Hypertension Maternal Grandfather   . Heart attack Maternal Grandfather     Social History   Tobacco Use  . Smoking status: Current Every Day Smoker    Types: E-cigarettes  . Smokeless tobacco: Never Used  Substance Use Topics  . Alcohol use: No  . Drug use: No    Prior to Admission medications   Medication Sig Start Date End Date Taking? Authorizing Provider  ARIPiprazole (ABILIFY) 10 MG tablet Take 1 tablet (10 mg total) by mouth at bedtime. For mood control 04/13/17   Money, Gerlene Burdockravis B, FNP  benzonatate (TESSALON) 100 MG capsule Take 1 capsule (100 mg total) by mouth every 8 (eight) hours. 06/10/17   Eustace MooreNelson, Yvonne Sue, MD  levothyroxine (SYNTHROID, LEVOTHROID) 88 MCG tablet Take 1 tablet (88 mcg total) by mouth daily before breakfast. For hypothyroidism 04/14/17    Money, Gerlene Burdockravis B, FNP  predniSONE (DELTASONE) 20 MG tablet Take 1 tablet (20 mg total) by mouth 2 (two) times daily with a meal. 06/10/17   Eustace MooreNelson, Yvonne Sue, MD    Allergies Sumatriptan   REVIEW OF SYSTEMS  Negative except as noted here or in the History of Present Illness.   PHYSICAL EXAMINATION  Initial Vital Signs Blood pressure 115/70, pulse 80, temperature 98.2 F (36.8 C), temperature source Oral, resp. rate 16, height 5\' 2"  (1.575 m), weight 59 kg (130 lb), last menstrual period 06/13/2017, SpO2 98 %.  Examination General: Well-developed, well-nourished female in no acute distress; appearance consistent with age of record HENT: normocephalic; atraumatic Eyes: Normal appearance Neck: supple Heart: regular rate and rhythm Lungs: clear to auscultation bilaterally Abdomen: soft; nondistended; nontender; bowel sounds present Extremities: No deformity; full range of motion; pulses normal; laceration left volar forearm with fat visible in wound, left upper extremity distally neurovascularly intact with intact tendon function; old, healed scars of left volar forearm at random angles not parallel to one another Neurologic: Awake, alert and oriented; motor function intact in all extremities and symmetric; no facial droop Skin: Warm and dry Psychiatric: Normal mood and affect   RESULTS  Summary of this visit's results, reviewed by myself:   EKG Interpretation  Date/Time:    Ventricular Rate:  PR Interval:    QRS Duration:   QT Interval:    QTC Calculation:   R Axis:     Text Interpretation:        Laboratory Studies: No results found for this or any previous visit (from the past 24 hour(s)). Imaging Studies: No results found.  ED COURSE and MDM  Nursing notes and initial vitals signs, including pulse oximetry, reviewed.  Vitals:   06/27/17 2235 06/27/17 2236  BP:  115/70  Pulse:  80  Resp:  16  Temp:  98.2 F (36.8 C)  TempSrc:  Oral  SpO2:  98%  Weight:  59 kg (130 lb)   Height: 5\' 2"  (1.575 m)    I do not believe antibiotics are indicated at this time as the wound is clean and relatively shallow.  PROCEDURES   LACERATION REPAIR Performed by: Hanley Seamen Authorized by: Hanley Seamen Consent: Verbal consent obtained. Risks and benefits: risks, benefits and alternatives were discussed Consent given by: patient Patient identity confirmed: provided demographic data Prepped and Draped in normal sterile fashion Wound explored  Laceration Location: Left volar forearm  Laceration Length: 5.5 cm  No Foreign Bodies seen or palpated  Anesthesia: local infiltration  Local anesthetic: lidocaine 2 % with epinephrine  Anesthetic total: 10 ml  Irrigation method: syringe Amount of cleaning: standard  Skin closure: 4-0 Prolene  Number of sutures: 8  Technique: Ample interrupted  Patient tolerance: Patient tolerated the procedure well with no immediate complications.      ED DIAGNOSES     ICD-10-CM   1. Laceration of skin of left forearm, initial encounter Z30.865H        Paula Libra, MD 06/27/17 2344    Paula Libra, MD 06/27/17 2345

## 2017-06-27 NOTE — ED Triage Notes (Signed)
Laceration to her left forearm an hour ago. States she was taking out old windows and her arm went through a pane of glass.

## 2017-06-28 MED ORDER — LIDOCAINE-EPINEPHRINE (PF) 2 %-1:200000 IJ SOLN
10.0000 mL | Freq: Once | INTRAMUSCULAR | Status: AC
  Administered 2017-06-27: 10 mL
  Filled 2017-06-28: qty 10

## 2017-10-13 DIAGNOSIS — E039 Hypothyroidism, unspecified: Secondary | ICD-10-CM | POA: Diagnosis not present

## 2017-10-31 DIAGNOSIS — R5383 Other fatigue: Secondary | ICD-10-CM | POA: Diagnosis not present

## 2017-12-06 DIAGNOSIS — R1111 Vomiting without nausea: Secondary | ICD-10-CM | POA: Diagnosis not present

## 2017-12-06 DIAGNOSIS — R0789 Other chest pain: Secondary | ICD-10-CM | POA: Diagnosis not present

## 2017-12-13 DIAGNOSIS — F17290 Nicotine dependence, other tobacco product, uncomplicated: Secondary | ICD-10-CM | POA: Insufficient documentation

## 2017-12-13 DIAGNOSIS — R1011 Right upper quadrant pain: Secondary | ICD-10-CM | POA: Insufficient documentation

## 2017-12-13 DIAGNOSIS — Z79899 Other long term (current) drug therapy: Secondary | ICD-10-CM | POA: Insufficient documentation

## 2017-12-14 ENCOUNTER — Emergency Department (HOSPITAL_COMMUNITY)
Admission: EM | Admit: 2017-12-14 | Discharge: 2017-12-14 | Disposition: A | Discharge status: Home / Self Care | Payer: BLUE CROSS/BLUE SHIELD | Attending: Emergency Medicine | Admitting: Emergency Medicine

## 2017-12-14 ENCOUNTER — Other Ambulatory Visit: Payer: Self-pay

## 2017-12-14 ENCOUNTER — Encounter (HOSPITAL_COMMUNITY): Payer: Self-pay

## 2017-12-14 ENCOUNTER — Emergency Department (HOSPITAL_COMMUNITY): Payer: BLUE CROSS/BLUE SHIELD

## 2017-12-14 DIAGNOSIS — R1011 Right upper quadrant pain: Secondary | ICD-10-CM

## 2017-12-14 LAB — URINALYSIS, ROUTINE W REFLEX MICROSCOPIC
BILIRUBIN URINE: NEGATIVE
Glucose, UA: NEGATIVE mg/dL
Hgb urine dipstick: NEGATIVE
KETONES UR: 5 mg/dL — AB
Nitrite: NEGATIVE
PROTEIN: NEGATIVE mg/dL
Specific Gravity, Urine: 1.016 (ref 1.005–1.030)
pH: 7 (ref 5.0–8.0)

## 2017-12-14 LAB — COMPREHENSIVE METABOLIC PANEL
ALBUMIN: 4 g/dL (ref 3.5–5.0)
ALT: 14 U/L (ref 0–44)
AST: 15 U/L (ref 15–41)
Alkaline Phosphatase: 45 U/L (ref 38–126)
Anion gap: 6 (ref 5–15)
BILIRUBIN TOTAL: 0.4 mg/dL (ref 0.3–1.2)
BUN: 16 mg/dL (ref 6–20)
CO2: 27 mmol/L (ref 22–32)
Calcium: 8.7 mg/dL — ABNORMAL LOW (ref 8.9–10.3)
Chloride: 107 mmol/L (ref 98–111)
Creatinine, Ser: 0.58 mg/dL (ref 0.44–1.00)
GFR calc Af Amer: >60 mL/min (ref >60)
GFR calc non Af Amer: >60 mL/min (ref >60)
GLUCOSE: 88 mg/dL (ref 70–99)
POTASSIUM: 3.5 mmol/L (ref 3.5–5.1)
SODIUM: 140 mmol/L (ref 135–145)
Total Protein: 6.6 g/dL (ref 6.5–8.1)

## 2017-12-14 LAB — CBC
HEMATOCRIT: 39.5 % (ref 36.0–46.0)
Hemoglobin: 12.9 g/dL (ref 12.0–15.0)
MCH: 30 pg (ref 26.0–34.0)
MCHC: 32.7 g/dL (ref 30.0–36.0)
MCV: 91.9 fL (ref 80.0–100.0)
Platelets: 293 10*3/uL (ref 150–400)
RBC: 4.3 MIL/uL (ref 3.87–5.11)
RDW: 13 % (ref 11.5–15.5)
WBC: 8.4 10*3/uL (ref 4.0–10.5)
nRBC: 0 % (ref 0.0–0.2)

## 2017-12-14 LAB — I-STAT BETA HCG BLOOD, ED (MC, WL, AP ONLY)

## 2017-12-14 LAB — LIPASE, BLOOD: Lipase: 28 U/L (ref 11–51)

## 2017-12-14 MED ORDER — ONDANSETRON HCL 4 MG/2ML IJ SOLN
4.0000 mg | Freq: Once | INTRAMUSCULAR | Status: AC
  Administered 2017-12-14: 4 mg via INTRAVENOUS
  Filled 2017-12-14: qty 2

## 2017-12-14 MED ORDER — OMEPRAZOLE 20 MG PO CPDR: 20.0000 mg | DELAYED_RELEASE_CAPSULE | Freq: Every day | ORAL | 0 refills | Status: DC

## 2017-12-14 MED ORDER — MORPHINE SULFATE (PF) 4 MG/ML IV SOLN
4.0000 mg | Freq: Once | INTRAVENOUS | Status: AC
  Administered 2017-12-14: 4 mg via INTRAVENOUS
  Filled 2017-12-14: qty 1

## 2017-12-14 MED ORDER — ONDANSETRON 4 MG PO TBDP: 4.0000 mg | ORAL_TABLET | Freq: Three times a day (TID) | ORAL | 0 refills | Status: DC | PRN

## 2017-12-14 NOTE — ED Notes (Signed)
US in room 

## 2017-12-14 NOTE — Discharge Instructions (Addendum)
You were seen today for right upper quadrant pain.  Your work-up is reassuring including ultrasound.  Start omeprazole to see if this helps.  You may need further work-up including a HIDA scan or an endoscopy.  Follow-up with gastroenterology.  If you develop worsening vomiting, increasing pain, any new or worsening symptoms you should be evaluated.

## 2017-12-14 NOTE — ED Provider Notes (Signed)
[facility]-EMERGENCY DEPT Provider Note   CSN: [id number] Arrival date & time: 12/13/17  2337     History   Chief Complaint Chief Complaint  Patient presents with  . Abdominal Pain    HPI Jody Carter is a 22 y.o. female.  HPI  This is a 22 year old female who presents with right upper quadrant pain.  Patient reports several week history of worsening right upper quadrant pain.  She states that symptoms occur after eating.  Initially her symptoms were just burping and occasional vomiting but she developed right upper quadrant pain.  At times it radiates into her right shoulder and her back.  She reports that it is sharp.  It is currently 7 out of 10.  She states that she was seen at a hospital last week and had an ultrasound but "they would not tell me the results."  She denies any significant constipation or diarrhea.  No fevers.  She does report cloudy urine and some dysuria.  She reports she has had 3 episodes of nonbilious, nonbloody emesis today.  Past Medical History:  Diagnosis Date  . Anxiety   . Depression   . Hx of migraines   . Thyroid disease     Patient Active Problem List   Diagnosis Date Noted  . Bipolar disorder, unspecified (HCC) 04/11/2017  . Active labor 02/18/2014  . Susceptible to varicella (non-immune), currently pregnant 07/03/2013  . Supervision of normal first teen pregnancy 06/15/2013  . Hx of migraines 07/31/2012    Past Surgical History:  Procedure Laterality Date  . ADENOIDECTOMY    . BREAST SURGERY     augmentation  . EYE SURGERY Bilateral    closed tear ducts opened as child  . TONSILLECTOMY       OB History    Gravida  1   Para  1   Term  1   Preterm      AB      Living  1     SAB      TAB      Ectopic      Multiple  0   Live Births  1            Home Medications    Prior to Admission medications   Medication Sig Start Date End Date Taking? Authorizing Provider  levothyroxine  (SYNTHROID, LEVOTHROID) 88 MCG tablet Take 1 tablet (88 mcg total) by mouth daily before breakfast. For hypothyroidism 04/14/17  Yes Money, Gerlene Burdockravis B, FNP  ARIPiprazole (ABILIFY) 10 MG tablet Take 1 tablet (10 mg total) by mouth at bedtime. For mood control Patient not taking: Reported on 12/14/2017 04/13/17   Money, Gerlene Burdockravis B, FNP  benzonatate (TESSALON) 100 MG capsule Take 1 capsule (100 mg total) by mouth every 8 (eight) hours. Patient not taking: Reported on 12/14/2017 06/10/17   Eustace MooreNelson, Yvonne Sue, MD  omeprazole (PRILOSEC) 20 MG capsule Take 1 capsule (20 mg total) by mouth daily. 12/14/17   Lavon Bothwell, Mayer Maskerourtney F, MD  ondansetron (ZOFRAN ODT) 4 MG disintegrating tablet Take 1 tablet (4 mg total) by mouth every 8 (eight) hours as needed for nausea or vomiting. 12/14/17   Gabryella Murfin, Mayer Maskerourtney F, MD  predniSONE (DELTASONE) 20 MG tablet Take 1 tablet (20 mg total) by mouth 2 (two) times daily with a meal. Patient not taking: Reported on 12/14/2017 06/10/17   Eustace MooreNelson, Yvonne Sue, MD    Family History Family History  Problem Relation Age of Onset  . Heart  attack Father   . Diabetes Paternal Grandfather   . Hypertension Maternal Grandmother   . Heart attack Maternal Grandmother   . Hypertension Maternal Grandfather   . Heart attack Maternal Grandfather     Social History Social History   Tobacco Use  . Smoking status: Current Every Day Smoker    Types: E-cigarettes  . Smokeless tobacco: Never Used  Substance Use Topics  . Alcohol use: No  . Drug use: No     Allergies   Sumatriptan   Review of Systems Review of Systems  Constitutional: Negative for fever.  Respiratory: Negative for shortness of breath.   Cardiovascular: Negative for chest pain.  Gastrointestinal: Positive for abdominal pain, nausea and vomiting.  Genitourinary: Positive for dysuria.  All other systems reviewed and are negative.    Physical Exam Updated Vital Signs BP 103/61   Pulse 85   Temp 98.3 F (36.8 C)  (Oral)   Resp 19   Ht 1.6 m (5\' 3" )   Wt 59.9 kg   SpO2 96%   BMI 23.38 kg/m   Physical Exam  Constitutional: She is oriented to person, place, and time. She appears well-developed and well-nourished. No distress.  Overweight  HENT:  Head: Normocephalic and atraumatic.  Eyes: Pupils are equal, round, and reactive to light.  Neck: Neck supple.  Cardiovascular: Normal rate, regular rhythm and normal heart sounds.  Pulmonary/Chest: Effort normal. No respiratory distress. She has no wheezes.  Abdominal: Soft. Bowel sounds are normal. There is tenderness in the right upper quadrant. There is no rebound and no guarding.  Neurological: She is alert and oriented to person, place, and time.  Skin: Skin is warm and dry.  Psychiatric: She has a normal mood and affect.  Nursing note and vitals reviewed.    ED Treatments / Results  Labs (all labs ordered are listed, but only abnormal results are displayed) Labs Reviewed  COMPREHENSIVE METABOLIC PANEL - Abnormal; Notable for the following components:      Result Value   Calcium 8.7 (*)    All other components within normal limits  URINALYSIS, ROUTINE W REFLEX MICROSCOPIC - Abnormal; Notable for the following components:   APPearance HAZY (*)    Ketones, ur 5 (*)    Leukocytes, UA MODERATE (*)    Bacteria, UA RARE (*)    All other components within normal limits  LIPASE, BLOOD  CBC  I-STAT BETA HCG BLOOD, ED ([facility], [facility], [facility] ONLY)    EKG None  Radiology US Abdomen Limited Ruq  Result Date: 12/14/2017 CLINICAL DATA:  22 y/o  F; right upper quadrant abdominal pain. EXAM: ULTRASOUND ABDOMEN LIMITED RIGHT UPPER QUADRANT COMPARISON:  12/13/2017 abdominal ultrasound. FINDINGS: Gallbladder: No gallstones or wall thickening visualized. No sonographic Murphy sign noted by sonographer. Common bile duct: Diameter: 5 mm Liver: No focal lesion identified. Within normal limits in parenchymal echogenicity. Portal vein is patent on color Doppler  imaging with normal direction of blood flow towards the liver. IMPRESSION: Normal stable right upper quadrant ultrasound. Electronically Signed   By: Mitzi Hansen M.D.   On: 12/14/2017 03:22    Procedures Procedures (including critical care time)  Medications Ordered in ED Medications  morphine 4 MG/ML injection 4 mg (4 mg Intravenous Given 12/14/17 0150)  ondansetron (ZOFRAN) injection 4 mg (4 mg Intravenous Given 12/14/17 0150)  morphine 4 MG/ML injection 4 mg (4 mg Intravenous Given 12/14/17 0313)     Initial Impression / Assessment and Plan / ED Course  I  have reviewed the triage vital signs and the nursing notes.  Pertinent labs & imaging results that were available during my care of the patient were reviewed by me and considered in my medical decision making (see chart for details).     Patient presents with waxing and waning worsening right upper quadrant pain.  She is overall nontoxic and vital signs are reassuring.  She has tenderness without signs of peritonitis on exam.  Gallbladder pathology, pancreatitis, gastritis.  Patient was given pain and nausea medication.  Labs are largely unremarkable.  Ultrasound does not show any evidence of gallbladder inflammation.  I discussed the results with the patient.  Will start on omeprazole daily.  She needs follow-up with GI.  She may need further evaluation with endoscopy or HIDA scan.  Patient stated understanding.  After history, exam, and medical workup I feel the patient has been appropriately medically screened and is safe for discharge home. Pertinent diagnoses were discussed with the patient. Patient was given return precautions.   Final Clinical Impressions(s) / ED Diagnoses   Final diagnoses:  RUQ pain    ED Discharge Orders         Ordered    omeprazole (PRILOSEC) 20 MG capsule  Daily     12/14/17 0433    ondansetron (ZOFRAN ODT) 4 MG disintegrating tablet  Every 8 hours PRN     12/14/17 0433            Shon Baton, MD 12/14/17 [phone]

## 2017-12-14 NOTE — ED Notes (Signed)
Po h20 given

## 2017-12-14 NOTE — ED Triage Notes (Signed)
Pt reports RUQ abdominal pain and vomiting after eating for the last month that has worsened the last 2 weeks. Pt states that the pain is in her RUQ and goes through to her shoulder. She states that she had an abdominal ultrasound done earlier this month, but couldn't get the results. She also endorses less frequent bowel movements and cloudy urine. A&Ox4. Ambulatory.

## 2017-12-27 ENCOUNTER — Encounter: Payer: Self-pay | Admitting: Gastroenterology

## 2017-12-28 DIAGNOSIS — R1011 Right upper quadrant pain: Secondary | ICD-10-CM | POA: Diagnosis not present

## 2017-12-28 DIAGNOSIS — N3000 Acute cystitis without hematuria: Secondary | ICD-10-CM | POA: Diagnosis not present

## 2018-01-10 DIAGNOSIS — K824 Cholesterolosis of gallbladder: Secondary | ICD-10-CM | POA: Diagnosis not present

## 2018-01-11 DIAGNOSIS — R109 Unspecified abdominal pain: Secondary | ICD-10-CM | POA: Diagnosis not present

## 2018-01-11 DIAGNOSIS — R112 Nausea with vomiting, unspecified: Secondary | ICD-10-CM | POA: Diagnosis not present

## 2018-01-17 DIAGNOSIS — K828 Other specified diseases of gallbladder: Secondary | ICD-10-CM | POA: Diagnosis not present

## 2018-01-26 ENCOUNTER — Ambulatory Visit: Payer: Self-pay | Admitting: Gastroenterology

## 2018-02-16 DIAGNOSIS — R457 State of emotional shock and stress, unspecified: Secondary | ICD-10-CM | POA: Diagnosis not present

## 2018-03-01 ENCOUNTER — Ambulatory Visit: Payer: BLUE CROSS/BLUE SHIELD | Admitting: Obstetrics and Gynecology

## 2018-03-01 ENCOUNTER — Encounter: Payer: Self-pay | Admitting: Obstetrics and Gynecology

## 2018-03-01 VITALS — BP 133/85 | HR 98 | Ht 63.0 in | Wt 126.0 lb

## 2018-03-01 DIAGNOSIS — N939 Abnormal uterine and vaginal bleeding, unspecified: Secondary | ICD-10-CM | POA: Diagnosis not present

## 2018-03-01 DIAGNOSIS — Z3202 Encounter for pregnancy test, result negative: Secondary | ICD-10-CM

## 2018-03-01 LAB — POCT URINE PREGNANCY: PREG TEST UR: NEGATIVE
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
## 2018-03-17 DIAGNOSIS — E039 Hypothyroidism, unspecified: Secondary | ICD-10-CM | POA: Diagnosis not present

## 2018-05-17 ENCOUNTER — Ambulatory Visit (INDEPENDENT_AMBULATORY_CARE_PROVIDER_SITE_OTHER): Payer: Self-pay | Admitting: Adult Health

## 2018-05-17 ENCOUNTER — Encounter: Payer: Self-pay | Admitting: Adult Health

## 2018-05-17 ENCOUNTER — Other Ambulatory Visit: Payer: Self-pay

## 2018-05-17 DIAGNOSIS — Z3201 Encounter for pregnancy test, result positive: Secondary | ICD-10-CM

## 2018-05-17 DIAGNOSIS — Z3A01 Less than 8 weeks gestation of pregnancy: Secondary | ICD-10-CM | POA: Insufficient documentation

## 2018-05-17 DIAGNOSIS — O3680X0 Pregnancy with inconclusive fetal viability, not applicable or unspecified: Secondary | ICD-10-CM

## 2018-05-17 MED ORDER — FLINTSTONES COMPLETE 18 MG PO CHEW: 2.0000 | CHEWABLE_TABLET | Freq: Every day | ORAL | Status: DC

## 2018-05-17 NOTE — Progress Notes (Signed)
Patient ID: Jody Carter, female   DOB: 1995/11/07, 23 y.o.   MRN: [id number]   TELEHEALTH VIRTUAL GYNECOLOGY VISIT ENCOUNTER NOTE  I connected with Jody Carter on 05/17/18 at  2:45 PM EDT by telephone at home and verified that I am speaking with the correct person using two identifiers.   I discussed the limitations, risks, security and privacy concerns of performing an evaluation and management service by telephone and the availability of in person appointments. I also discussed with the patient that there may be a patient responsible charge related to this service. The patient expressed understanding and agreed to proceed.   History:  TAHEERA GURRERO is a 23 y.o. [phone] white female,married being evaluated today for having +HPTs, her periods have been irregular, LMP about 3/35/2020, so about 4 weeks with EDD 01/24/2019.She says line were faint. She has some nausea. She denies any abnormal vaginal discharge, bleeding, pelvic pain or other concerns.  She is currently unemployed. PCP is Ashely Long PA      Past Medical History:  Diagnosis Date  . Anxiety   . Depression   . Hx of migraines   . Panic disorder   . Thyroid disease    Past Surgical History:  Procedure Laterality Date  . ADENOIDECTOMY    . BREAST SURGERY     augmentation  . CHOLECYSTECTOMY    . EYE SURGERY Bilateral    closed tear ducts opened as child  . TONSILLECTOMY     The following portions of the patient's history were reviewed and updated as appropriate: allergies, current medications, past family history, past medical history, past social history, past surgical history and problem list.   Health Maintenance:  Normal pap at [facility]  Review of Systems:  Pertinent items noted in HPI and remainder of comprehensive ROS otherwise negative.  Physical Exam:   General:  Alert, oriented and cooperative.   Mental Status: Normal mood and affect perceived. Normal judgment and thought content.  Physical exam  deferred due to nature of the encounter LMP 04/19/2018 (Exact Date) per pt Fall risk is low. PHQ 2 score 0,   Labs and Imaging No results found for this or any previous visit (from the past 336 hour(s)). No results found.    Assessment and Plan:     1. Positive pregnancy test - Beta hCG quant (ref lab) - Progesterone  2. Less than [redacted] weeks gestation of pregnancy - Beta hCG quant (ref lab) - Progesterone -eat often Meds ordered this encounter  Medications  . Pediatric Multivitamins-Iron (FLINTSTONES COMPLETE) 18 MG CHEW    Sig: Chew 2 tablets by mouth daily.    Order Specific Question:   Supervising Provider    Answer:   Duane Lope H [[id number]]    3. Encounter to determine fetal viability of pregnancy, single or unspecified fetus - US OB Comp Less 14 Wks; Future, in about 4 weeks        I discussed the assessment and treatment plan with the patient. The patient was provided an opportunity to ask questions and all were answered. The patient agreed with the plan and demonstrated an understanding of the instructions.   The patient was advised to call back or seek an in-person evaluation/go to the ED if the symptoms worsen or if the condition fails to improve as anticipated.  I provided 8 minutes of non-face-to-face time during this encounter.   Cyril Mourning, NP [facility] [employer], [facility]

## 2018-05-19 ENCOUNTER — Telehealth: Payer: Self-pay | Admitting: Adult Health

## 2018-05-19 LAB — BETA HCG QUANT (REF LAB): hCG Quant: 65 m[IU]/mL

## 2018-05-19 LAB — PROGESTERONE: Progesterone: 21.7 ng/mL

## 2018-05-19 NOTE — Telephone Encounter (Signed)
Spoke with pt letting her know progesterone was great and quant shows around 3-[redacted] weeks pregnant. Pt voiced understanding and has an [place] already scheduled. JSY

## 2018-05-19 NOTE — Telephone Encounter (Signed)
Patient called, would like to know if her lab results are back from yesterday.  [phone]

## 2018-05-31 ENCOUNTER — Ambulatory Visit: Payer: Self-pay | Admitting: Adult Health

## 2018-05-31 ENCOUNTER — Telehealth: Payer: Self-pay | Admitting: Adult Health

## 2018-05-31 MED ORDER — PROMETHAZINE HCL 25 MG PO TABS: 25.0000 mg | ORAL_TABLET | Freq: Four times a day (QID) | ORAL | 1 refills | Status: DC | PRN

## 2018-05-31 NOTE — Telephone Encounter (Signed)
Pt requesting something to be called in for nausea.

## 2018-05-31 NOTE — Telephone Encounter (Signed)
Will rx phenergan  

## 2018-06-02 ENCOUNTER — Encounter: Payer: Self-pay | Admitting: *Deleted

## 2018-06-02 ENCOUNTER — Telehealth: Payer: Self-pay | Admitting: Obstetrics & Gynecology

## 2018-06-02 NOTE — Telephone Encounter (Signed)
Pt states Phenergan is not helping. Spoke with Tish, Charity fundraiser. Tish will send pt info through MyChart. Pt states she can't get into her MyChart. Toney Reil will assist pt with MyChart. JSY

## 2018-06-02 NOTE — Telephone Encounter (Signed)
Patient called stating she was prescribed a nausea medication but it is not working. Pt would like [place] to send another medication to [facility] [place]. Please contact pt

## 2018-06-14 ENCOUNTER — Other Ambulatory Visit: Payer: Self-pay

## 2018-06-14 ENCOUNTER — Ambulatory Visit (INDEPENDENT_AMBULATORY_CARE_PROVIDER_SITE_OTHER): Payer: Medicaid Other

## 2018-06-14 DIAGNOSIS — Z3A08 8 weeks gestation of pregnancy: Secondary | ICD-10-CM | POA: Diagnosis not present

## 2018-06-14 DIAGNOSIS — O3680X0 Pregnancy with inconclusive fetal viability, not applicable or unspecified: Secondary | ICD-10-CM | POA: Diagnosis not present

## 2018-06-14 NOTE — Progress Notes (Signed)
[place] 8 wks,single IUP w/ys,positive fht 166 bpm,normal ovaries bilat,crl 15.63 mm

## 2018-06-16 ENCOUNTER — Telehealth: Payer: Self-pay | Admitting: Women's Health

## 2018-06-16 ENCOUNTER — Telehealth: Payer: Self-pay | Admitting: Obstetrics & Gynecology

## 2018-06-16 MED ORDER — CEPHALEXIN 500 MG PO CAPS: 500.0000 mg | ORAL_CAPSULE | Freq: Three times a day (TID) | ORAL | 0 refills | Status: DC

## 2018-06-16 NOTE — Telephone Encounter (Signed)
Pt informed that rx at [facility] and let us know if not feeling better next week.

## 2018-06-16 NOTE — Telephone Encounter (Signed)
Patient called stating that she has a really bad UTI. Pt states she has been having burning when she urinates and pain of the abdominal area. Pt states that [facility] uses [facility] [place]. Please contact pt

## 2018-06-16 NOTE — Telephone Encounter (Signed)
Rx keflex 500 TID x 7d

## 2018-07-18 ENCOUNTER — Other Ambulatory Visit: Payer: Self-pay | Admitting: Obstetrics and Gynecology

## 2018-07-18 DIAGNOSIS — Z3682 Encounter for antenatal screening for nuchal translucency: Secondary | ICD-10-CM

## 2018-07-19 ENCOUNTER — Ambulatory Visit: Payer: Medicaid Other | Admitting: *Deleted

## 2018-07-19 ENCOUNTER — Encounter: Payer: Medicaid Other | Admitting: Women's Health

## 2018-07-19 ENCOUNTER — Other Ambulatory Visit: Payer: Medicaid Other

## 2018-10-05 ENCOUNTER — Encounter: Payer: Self-pay | Admitting: Advanced Practice Midwife

## 2018-10-05 ENCOUNTER — Ambulatory Visit (INDEPENDENT_AMBULATORY_CARE_PROVIDER_SITE_OTHER): Payer: Medicaid Other | Admitting: Advanced Practice Midwife

## 2018-10-05 ENCOUNTER — Other Ambulatory Visit: Payer: Self-pay

## 2018-10-05 VITALS — BP 113/72 | HR 80 | Ht 62.0 in

## 2018-10-05 DIAGNOSIS — N730 Acute parametritis and pelvic cellulitis: Secondary | ICD-10-CM

## 2018-10-05 DIAGNOSIS — Z30011 Encounter for initial prescription of contraceptive pills: Secondary | ICD-10-CM

## 2018-10-05 MED ORDER — IBUPROFEN 600 MG PO TABS: 600.0000 mg | ORAL_TABLET | Freq: Four times a day (QID) | ORAL | 1 refills | Status: DC | PRN

## 2018-10-05 MED ORDER — ONDANSETRON HCL 4 MG PO TABS: 4.0000 mg | ORAL_TABLET | Freq: Three times a day (TID) | ORAL | 0 refills | Status: DC | PRN

## 2018-10-05 MED ORDER — AZITHROMYCIN 250 MG PO TABS: 1000.0000 mg | ORAL_TABLET | ORAL | 1 refills | Status: DC

## 2018-10-05 MED ORDER — NORETHIN ACE-ETH ESTRAD-FE 1-20 MG-MCG(24) PO TABS: 1.0000 | ORAL_TABLET | Freq: Every day | ORAL | 11 refills | Status: DC

## 2018-10-05 NOTE — Patient Instructions (Signed)
Pelvic Inflammatory Disease ° °Pelvic inflammatory disease (PID) is caused by an infection in some or all of the female reproductive organs. The infection can be in the uterus, ovaries, fallopian tubes, or the surrounding tissues in the pelvis. PID can cause abdominal or pelvic pain that comes on suddenly (acute pelvic pain). °PID is a serious infection because it can lead to lasting (chronic) pelvic pain or the inability to have children (infertility). °What are the causes? °This condition is most often caused by bacteria that is spread during sexual contact. It can also be caused by a bacterial infection of the vagina (bacterial vaginosis) that is not spread by sexual contact. °This condition occurs when the infection is not treated and the bacteria travel upward from the vagina or cervix into the reproductive organs. Bacteria may also be introduced into the reproductive organs following: °· The birth of a baby. °· A miscarriage. °· An abortion. °· Major pelvic surgery. °· The insertion of an intrauterine device (IUD). °· A sexual assault. °What increases the risk? °You are more likely to develop this condition if you: °· Are younger than 22 years of age. °· Are sexually active at a young age. °· Have a history of STI (sexually transmitted infection) or PID. °· Do not regularly use barrier contraception methods, such as condoms. °· Have multiple sexual partners. °· Have sex with someone who has symptoms of an STI. °· Use a vaginal douche. °· Have recently had an IUD inserted. °What are the signs or symptoms? °Symptoms of this condition include: °· Abdominal or pelvic pain. °· Fever. °· Chills. °· Abnormal vaginal discharge. °· Abnormal uterine bleeding. °· Unusual pain shortly after the end of a menstrual period. °· Painful urination. °· Pain with sex. °· Nausea and vomiting. °How is this diagnosed? °This condition is diagnosed based on a pelvic exam and medical history. A pelvic exam can reveal signs of  infection, inflammation, and discharge in the vagina and the surrounding tissues. It can also help to identify painful areas. You may also have tests, such as: °· Lab tests, including a pregnancy test, blood tests, and a urine test. °· Culture tests of the vagina and cervix to check for an STI. °· Ultrasound. °· A laparoscopic procedure to look inside the pelvis. °· Examination of vaginal discharge under a microscope. °How is this treated? °This condition may be treated with: °· Antibiotic medicines taken by mouth (orally). For more severe cases, antibiotics may be given through an IV at the hospital. °· Surgery. This is rare. Surgery may be needed if other treatments do not help. °· Efforts to stop the spread of the infection. Sexual partners may need to be treated if the infection is caused by an STI. °It may take weeks until you are completely well. If you are diagnosed with PID, you should also be checked for HIV (human immunodeficiency virus). Your health care provider may test you for infection again 3 months after treatment. You should not have unprotected sex. °Follow these instructions at home: °· Take over-the-counter and prescription medicines only as told by your health care provider. °· If you were prescribed an antibiotic medicine, take it as told by your health care provider. Do not stop using the antibiotic even if you start to feel better. °· Do not have sex until treatment is completed or as told by your health care provider. If PID is confirmed, your recent sexual partners will need treatment, especially if you had unprotected sex. °· Keep all   follow-up visits as told by your health care provider. This is important. °Contact a health care provider if: °· You have increased or abnormal vaginal discharge. °· Your pain does not improve. °· You vomit. °· You have a fever. °· You cannot tolerate your medicines. °· Your partner has an STI. °· You have pain when you urinate. °Get help right away  if: °· You have increased abdominal or pelvic pain. °· You have chills. °· Your symptoms are not better in 72 hours with treatment. °Summary °· Pelvic inflammatory disease (PID) is caused by an infection in some or all of the female reproductive organs. °· PID is a serious infection because it can lead to lasting (chronic) pelvic pain or the inability to have children (infertility). °· This infection is usually treated with antibiotic medicines. °· Do not have sex until treatment is completed or as told by your health care provider. °This information is not intended to replace advice given to you by your health care provider. Make sure you discuss any questions you have with your health care provider. °Document Released: 01/11/2005 Document Revised: 09/29/2017 Document Reviewed: 10/04/2017 °Elsevier Patient Education © 2020 Elsevier Inc. ° °

## 2018-10-05 NOTE — Progress Notes (Signed)
GYNECOLOGY ANNUAL PREVENTATIVE CARE ENCOUNTER NOTE  History:     Jody Carter is a 23 y.o. [phone] female here for problem gynecologic exam.  Current complaints: pelvic pain, dyspareunia, concern RE: recent CT finding of possible inflammation of uterus 09/25/18. Wet prep, culture, CBC, Pelvic US Nml. CT otherwise Nml.  Denies abnormal vaginal bleeding, discharge, .    Gynecologic History Patient's last menstrual period was 09/15/2018 (exact date). Contraception: none  Surgical EAB 4/20  Obstetric History OB History  Gravida Para Term Preterm AB Living  4 1 1   2 1   SAB TAB Ectopic Multiple Live Births  1 1   0 1    # Outcome Date GA Lbr Len/2nd Weight Sex Delivery Anes PTL Lv  4 Gravida           3 Term 02/19/14 [id number] 13:55 / 00:13 8 lb 3.8 oz (3.737 kg) F Vag-Spont EPI  LIV  2 TAB           1 SAB             Past Medical History:  Diagnosis Date  . Anxiety   . Depression   . Hx of migraines   . Panic disorder   . Thyroid disease     Past Surgical History:  Procedure Laterality Date  . ADENOIDECTOMY    . BREAST SURGERY     augmentation  . CHOLECYSTECTOMY    . EYE SURGERY Bilateral    closed tear ducts opened as child  . TONSILLECTOMY      Current Outpatient Medications on File Prior to Visit  Medication Sig Dispense Refill  . levothyroxine (SYNTHROID, LEVOTHROID) 88 MCG tablet Take 1 tablet (88 mcg total) by mouth daily before breakfast. For hypothyroidism 30 tablet 0  . LINZESS 145 MCG CAPS capsule TAKE ONE CAPSULE (145 MCG DOSE) BY MOUTH DAILY 30 MINUTES BEFORE A MEAL     No current facility-administered medications on file prior to visit.     Allergies  Allergen Reactions  . Sumatriptan Other (See Comments)    Neck and chest pressure, parasthesias    Social History:  reports that she has quit smoking. Her smoking use included e-cigarettes. She has never used smokeless tobacco. She reports that she does not drink alcohol or use drugs.  Family  History  Problem Relation Age of Onset  . Heart attack Father   . Diabetes Paternal Grandfather   . Hypertension Maternal Grandmother   . Heart attack Maternal Grandmother   . Hypertension Maternal Grandfather   . Heart attack Maternal Grandfather     The following portions of the patient's history were reviewed and updated as appropriate: allergies, current medications, past family history, past medical history, past social history, past surgical history and problem list.  Review of Systems Review of Systems  Constitutional: Negative for appetite change, chills and fever.  Gastrointestinal: Positive for abdominal pain, nausea and vomiting. Negative for abdominal distention, constipation and diarrhea.  Genitourinary: Positive for dyspareunia, menstrual problem (irreg) and pelvic pain. Negative for dysuria, flank pain, frequency, hematuria, urgency, vaginal bleeding, vaginal discharge and vaginal pain.  Musculoskeletal: Negative for back pain.      Physical Exam:  BP 113/72 (BP Location: Left Arm, Patient Position: Sitting, Cuff Size: Normal)   Pulse 80   Ht 5\' 2"  (1.575 m)   LMP 09/15/2018 (Exact Date)   Breastfeeding Unknown   BMI 23.05 kg/m  CONSTITUTIONAL: Well-developed, well-nourished female in no acute distress.  HENT:  Normocephalic, atraumatic. EYES: Conjunctivae normal. No scleral icterus.  SKIN: Skin is warm and dry. No rash noted. Not diaphoretic. No erythema. No pallor. NEUROLOGIC: Alert and oriented to person, place, and time. Normal muscle tone coordination.  PSYCHIATRIC: Normal mood and affect. Normal behavior. Normal judgment and thought content. CARDIOVASCULAR: Normal heart rate noted RESPIRATORY: Clear to auscultation bilaterally. Effort normal, no problems with respiration noted. ABDOMEN: Soft, no distention noted.  No tenderness, rebound or guarding.  PELVIC: Normal appearing external genitalia; normal appearing vaginal mucosa and cervix.  No abnormal  discharge noted.  Pap smear not obtained.  Normal uterine size, no other palpable masses, mild uterine tenderness. No adnexal masses   Assessment and Plan:  1. PID (acute pelvic inflammatory disease) - Rocephin given - Pt unlikely to take Doxy due to already having N/V and presence of eating disorder. Will Tx w/ Azithro.  - azithromycin (ZITHROMAX) 250 MG tablet; Take 4 tablets (1,000 mg total) by mouth once a week. Take 4 pills by mouth today, then four pills in 1 week  Dispense: 4 tablet; Refill: 1 - ibuprofen (ADVIL) 600 MG tablet; Take 1 tablet (600 mg total) by mouth every 6 (six) hours as needed.  Dispense: 30 tablet; Refill: 1 - ondansetron (ZOFRAN) 4 MG tablet; Take 1 tablet (4 mg total) by mouth every 8 (eight) hours as needed for nausea or vomiting.  Dispense: 20 tablet; Refill: 0  2. Oral contraception initial prescription  - Norethindrone Acetate-Ethinyl Estrad-FE (LOESTRIN 24 FE) 1-20 MG-MCG(24) tablet; Take 1 tablet by mouth daily.  Dispense: 1 Package; Refill: 11  F/U 2 weeks to gauge response to ABX and for complete annual Gyn exam.  No IC x 1 week.  Manya Silvas, [place] for [employer], [place]

## 2018-10-05 NOTE — Progress Notes (Signed)
Rocephin 250 mg given IM in left upper outer gluteal. Patient tolerated well and was observed to have no adverse reactions.

## 2018-10-26 ENCOUNTER — Ambulatory Visit: Payer: Medicaid Other | Admitting: Women's Health

## 2018-10-27 ENCOUNTER — Ambulatory Visit: Payer: Medicaid Other | Admitting: Adult Health

## 2018-10-30 ENCOUNTER — Ambulatory Visit: Payer: Medicaid Other | Admitting: Women's Health

## 2018-11-01 ENCOUNTER — Ambulatory Visit: Payer: Medicaid Other | Admitting: Advanced Practice Midwife

## 2018-12-11 ENCOUNTER — Telehealth: Payer: Self-pay | Admitting: Family Medicine

## 2018-12-11 NOTE — Telephone Encounter (Signed)
Received a call from patient needing to make an appointment. I noticed she was a patient at [facility]. When I asked her why she wasn't going to that office, she stated because she didn't want to. I asked her about her pregnancy test, then she stated she was going to call another office because she didn't like my attitude. As I began to explain my reasons for asking her questions, and to apologize if she thought I was being rude. She hung up the phone.

## 2019-01-17 ENCOUNTER — Telehealth (INDEPENDENT_AMBULATORY_CARE_PROVIDER_SITE_OTHER): Payer: Medicaid Other | Admitting: Lactation Services

## 2019-01-17 DIAGNOSIS — Z3201 Encounter for pregnancy test, result positive: Secondary | ICD-10-CM

## 2019-01-17 NOTE — Telephone Encounter (Signed)
Eliezer Lofts called from [facility] to report on patient. Pt is [facility]. Reviewed case with Dr. Kennon Rounds.   LMP 11/12/2018 Pt with + home pregnancy test on 12/07/2018 Pt with + UPT on 01/16/2019  Pt has had a transvaginal US by [facility] that showed twin pregnancy measuring 7 weeks with no fetal heart rates noted. The [place] tech reports they may have seen a 3rd sac.   Pt is active pt with [facility]. Called Tish at [facility] who reports they do not have any available [place] appts in the next few weeks. She requested that pt be scheduled with MFM and then to follow up with [facility] for care. Pt would like to tranfer to [place] office if possible.   Called [place] and scheduled for 01/29/2018 at 1 pm. Pt to arrive at 12:45 with full bladder. Pt to follow up with Nurse after [place] appt.   Called pt to inform her of [place] appt and location. Pt is aware that she is pregnant with at least 2 babies. She reports that she is aware that the babies are not measuring correctly at this stage.   Pt reports she had some rectal bleeding in November. She reports it was from rectal bleeding and is to be followed by GI.   Reviewed reporting to MAU for bleeding or pelvic pain. Pt denies bleeding and/or pain. Pt is nauseated. Pt reports twins and triplets run in her family.   Pt to call with any questions or concerns as needed.

## 2019-01-17 NOTE — Telephone Encounter (Signed)
Patient seen and assessed by nursing staff.  Agree with documentation and plan.  

## 2019-01-30 ENCOUNTER — Other Ambulatory Visit: Payer: Self-pay

## 2019-01-30 ENCOUNTER — Ambulatory Visit: Payer: Medicaid Other | Admitting: Advanced Practice Midwife

## 2019-01-30 ENCOUNTER — Encounter: Payer: Self-pay | Admitting: Advanced Practice Midwife

## 2019-01-30 ENCOUNTER — Ambulatory Visit (HOSPITAL_COMMUNITY)
Admission: RE | Admit: 2019-01-30 | Discharge: 2019-01-30 | Disposition: A | Discharge status: Home / Self Care | Payer: Medicaid Other | Source: Ambulatory Visit | Attending: Family Medicine | Admitting: Family Medicine

## 2019-01-30 ENCOUNTER — Other Ambulatory Visit: Payer: Self-pay | Admitting: Family Medicine

## 2019-01-30 ENCOUNTER — Telehealth: Payer: Self-pay | Admitting: Advanced Practice Midwife

## 2019-01-30 VITALS — BP 114/79 | HR 79 | Ht 63.0 in | Wt 137.0 lb

## 2019-01-30 DIAGNOSIS — Z3201 Encounter for pregnancy test, result positive: Secondary | ICD-10-CM

## 2019-01-30 DIAGNOSIS — Z3A01 Less than 8 weeks gestation of pregnancy: Secondary | ICD-10-CM

## 2019-01-30 DIAGNOSIS — O021 Missed abortion: Secondary | ICD-10-CM

## 2019-01-30 DIAGNOSIS — Z6710 Type A blood, Rh positive: Secondary | ICD-10-CM

## 2019-01-30 MED ORDER — OXYCODONE-ACETAMINOPHEN 10-325 MG PO TABS: 1.0000 | ORAL_TABLET | ORAL | 0 refills | Status: DC | PRN

## 2019-01-30 MED ORDER — LORAZEPAM 1 MG PO TABS: 1.0000 mg | ORAL_TABLET | Freq: Once | ORAL | 0 refills | Status: AC

## 2019-01-30 MED ORDER — IBUPROFEN 800 MG PO TABS: 800.0000 mg | ORAL_TABLET | Freq: Three times a day (TID) | ORAL | 0 refills | Status: DC | PRN

## 2019-01-30 MED ORDER — DOXYCYCLINE MONOHYDRATE 100 MG PO TABS: 200.0000 mg | ORAL_TABLET | Freq: Once | ORAL | 0 refills | Status: AC

## 2019-01-30 NOTE — Telephone Encounter (Signed)
IM message sent to Othella Boyer at [facility] to get scheduled for Manual Vaccum Aspiration.
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
## 2019-01-31 ENCOUNTER — Inpatient Hospital Stay (HOSPITAL_COMMUNITY): Payer: Medicaid Other

## 2019-01-31 ENCOUNTER — Encounter (HOSPITAL_COMMUNITY): Payer: Self-pay | Admitting: Family Medicine

## 2019-01-31 ENCOUNTER — Inpatient Hospital Stay (HOSPITAL_COMMUNITY)
Admission: AD | Admit: 2019-01-31 | Discharge: 2019-02-01 | Disposition: A | Discharge status: Home / Self Care | Payer: Medicaid Other | Attending: Family Medicine | Admitting: Family Medicine

## 2019-01-31 DIAGNOSIS — R42 Dizziness and giddiness: Secondary | ICD-10-CM

## 2019-01-31 DIAGNOSIS — O99281 Endocrine, nutritional and metabolic diseases complicating pregnancy, first trimester: Secondary | ICD-10-CM | POA: Insufficient documentation

## 2019-01-31 DIAGNOSIS — O039 Complete or unspecified spontaneous abortion without complication: Secondary | ICD-10-CM

## 2019-01-31 DIAGNOSIS — Z87891 Personal history of nicotine dependence: Secondary | ICD-10-CM | POA: Diagnosis not present

## 2019-01-31 DIAGNOSIS — O209 Hemorrhage in early pregnancy, unspecified: Secondary | ICD-10-CM | POA: Diagnosis present

## 2019-01-31 DIAGNOSIS — Z79890 Hormone replacement therapy (postmenopausal): Secondary | ICD-10-CM | POA: Diagnosis not present

## 2019-01-31 DIAGNOSIS — E079 Disorder of thyroid, unspecified: Secondary | ICD-10-CM | POA: Diagnosis not present

## 2019-01-31 DIAGNOSIS — O30031 Twin pregnancy, monochorionic/diamniotic, first trimester: Secondary | ICD-10-CM | POA: Diagnosis not present

## 2019-01-31 DIAGNOSIS — Z3A01 Less than 8 weeks gestation of pregnancy: Secondary | ICD-10-CM | POA: Insufficient documentation

## 2019-01-31 LAB — TYPE AND SCREEN
ABO/RH(D): A POS
Antibody Screen: NEGATIVE

## 2019-01-31 LAB — CBC
HCT: 34.7 % — ABNORMAL LOW (ref 36.0–46.0)
Hemoglobin: 11.9 g/dL — ABNORMAL LOW (ref 12.0–15.0)
MCH: 31.1 pg (ref 26.0–34.0)
MCHC: 34.3 g/dL (ref 30.0–36.0)
MCV: 90.6 fL (ref 80.0–100.0)
Platelets: 284 10*3/uL (ref 150–400)
RBC: 3.83 MIL/uL — ABNORMAL LOW (ref 3.87–5.11)
RDW: 12.4 % (ref 11.5–15.5)
WBC: 13.1 10*3/uL — ABNORMAL HIGH (ref 4.0–10.5)
nRBC: 0 % (ref 0.0–0.2)

## 2019-01-31 LAB — ABO/RH: ABO/RH(D): A POS

## 2019-01-31 LAB — POCT PREGNANCY, URINE: Preg Test, Ur: POSITIVE — AB

## 2019-01-31 MED ORDER — MISOPROSTOL 200 MCG PO TABS
600.0000 ug | ORAL_TABLET | Freq: Once | ORAL | Status: AC
  Administered 2019-01-31: 600 ug via BUCCAL
  Filled 2019-01-31: qty 3

## 2019-01-31 MED ORDER — LACTATED RINGERS IV BOLUS
1000.0000 mL | Freq: Once | INTRAVENOUS | Status: AC
  Administered 2019-01-31: 1000 mL via INTRAVENOUS

## 2019-01-31 MED ORDER — HYDROMORPHONE HCL 1 MG/ML IJ SOLN
1.0000 mg | Freq: Once | INTRAMUSCULAR | Status: AC
  Administered 2019-01-31: 1 mg via INTRAVENOUS
  Filled 2019-01-31: qty 1

## 2019-01-31 MED ORDER — ONDANSETRON HCL 4 MG/2ML IJ SOLN
4.0000 mg | Freq: Once | INTRAMUSCULAR | Status: AC
  Administered 2019-01-31: 4 mg via INTRAVENOUS
  Filled 2019-01-31: qty 2

## 2019-01-31 MED ORDER — KETOROLAC TROMETHAMINE 30 MG/ML IJ SOLN
30.0000 mg | Freq: Once | INTRAMUSCULAR | Status: AC
  Administered 2019-01-31: 30 mg via INTRAVENOUS
  Filled 2019-01-31: qty 1

## 2019-01-31 NOTE — MAU Note (Signed)
Patient presents via EMS for vaginal bleeding and cramping.  Was told she is [redacted] weeks pregnant with twins and that they were nonviable-scheduled for a D&C at the end of this month.  Started having bleeding yesterday that has since become much heavier and is feeling abdominal cramping and vaginal pressure.

## 2019-01-31 NOTE — MAU Provider Note (Addendum)
History     CSN: [id number]  Arrival date and time: 01/31/19 0865   First Provider Initiated Contact with Patient 01/31/19 1959     HPI   Jody Carter is a 24 y.o. female [phone] here in MAU with bleeding. She has a known failed pregnancy with 6 week mono-di twins. She is scheduled for a D&E on 1/13.  She started having heavy vaginal bleeding today and passing large clots.  At one point she felt like she was going to pass out and at that point she called 911. She denies dizziness at this time. Complains of strong uterine cramps that feels like contractions. The pain comes and goes. The pain is worse in her lower abdomen. The pain does not radiate.   CLINICAL DATA:  Positive pregnancy test. Unsure of LMP. Evaluate dating and viability.  EXAM: TWIN OBSTETRIC <14WK [place] AND TRANSVAGINAL OB US  COMPARISON:  None.  FINDINGS: Number of IUPs:  2  Chorionicity/Amnionicity:  Monochorionic-diamniotic (thin membrane)  TWIN 1  Yolk sac:  Not Visualized.  Embryo:  Visualized.  Cardiac Activity: Not Visualized.  Heart Rate: Absent  CRL:  8 mm   6 w 5 d                  [place] EDC: 09/20/2019  TWIN 2  Yolk sac:  Not Visualized.  Embryo:  Visualized.  Cardiac Activity: Not Visualized.  Heart Rate: Absent  CRL:  9 mm   6 w 6 d                  [place] EDC: 09/19/2019  Subchorionic hemorrhage:  None visualized.  Maternal uterus/adnexae: Both ovaries are normal in appearance. No mass or abnormal free fluid identified.  IMPRESSION: Findings meet definitive criteria for a failed twin pregnancy. This follows SRU consensus guidelines: Diagnostic Criteria for Nonviable Pregnancy Early in the First Trimester. [facility] [phone].   Electronically Signed   By: Danae Orleans M.D.   On: 01/30/2019 13:47   OB History    Gravida  5   Para  1   Term  1   Preterm      AB  2   Living  1     SAB  1   TAB  1   Ectopic      Multiple  0   Live Births  1           Past Medical History:  Diagnosis Date  . Anxiety   . Depression   . Hx of migraines   . Panic disorder   . Thyroid disease     Past Surgical History:  Procedure Laterality Date  . ADENOIDECTOMY    . BREAST SURGERY     augmentation  . CHOLECYSTECTOMY    . EYE SURGERY Bilateral    closed tear ducts opened as child  . TONSILLECTOMY      Family History  Problem Relation Age of Onset  . Heart attack Father   . Diabetes Paternal Grandfather   . Hypertension Maternal Grandmother   . Heart attack Maternal Grandmother   . Hypertension Maternal Grandfather   . Heart attack Maternal Grandfather     Social History   Tobacco Use  . Smoking status: Former Smoker    Types: E-cigarettes  . Smokeless tobacco: Never Used  Substance Use Topics  . Alcohol use: No  . Drug use: No    Allergies:  Allergies  Allergen Reactions  . Sumatriptan  Other (See Comments)    Neck and chest pressure, parasthesias    Medications Prior to Admission  Medication Sig Dispense Refill Last Dose  . ibuprofen (ADVIL) 800 MG tablet Take 1 tablet (800 mg total) by mouth every 8 (eight) hours as needed. 30 tablet 0   . levothyroxine (SYNTHROID, LEVOTHROID) 88 MCG tablet Take 1 tablet (88 mcg total) by mouth daily before breakfast. For hypothyroidism 30 tablet 0   . oxyCODONE-acetaminophen (PERCOCET) 10-325 MG tablet Take 1 tablet by mouth every 4 (four) hours as needed for pain. Bring to your appointment. You will take one at the office prior to the procedure 2 tablet 0    Results for orders placed or performed during the hospital encounter of 01/31/19 (from the past 48 hour(s))  Pregnancy, urine POC     Status: Abnormal   Collection Time: 01/31/19  7:42 PM  Result Value Ref Range   Preg Test, Ur POSITIVE (A) NEGATIVE    Comment:        THE SENSITIVITY OF THIS METHODOLOGY IS >24 mIU/mL   CBC     Status: Abnormal   Collection Time: 01/31/19  8:22 PM  Result Value Ref  Range   WBC 13.1 (H) 4.0 - 10.5 K/uL   RBC 3.83 (L) 3.87 - 5.11 MIL/uL   Hemoglobin 11.9 (L) 12.0 - 15.0 g/dL   HCT 34.7 (L) 36.0 - 46.0 %   MCV 90.6 80.0 - 100.0 fL   MCH 31.1 26.0 - 34.0 pg   MCHC 34.3 30.0 - 36.0 g/dL   RDW 12.4 11.5 - 15.5 %   Platelets 284 150 - 400 K/uL   nRBC 0.0 0.0 - 0.2 %    Comment: Performed at [facility], [address], [place], [place] [address]  Type and screen [place]     Status: None   Collection Time: 01/31/19  8:22 PM  Result Value Ref Range   ABO/RH(D) A POS    Antibody Screen NEG    Sample Expiration      02/03/2019,2359 Performed at [facility], [place] [address], [place], [place] [address]    Review of Systems  Constitutional: Negative for fever.  Gastrointestinal: Positive for abdominal pain.  Genitourinary: Positive for vaginal bleeding.  Neurological: Positive for dizziness.   Physical Exam   Blood pressure 112/68, pulse 96, temperature 98.9 F (37.2 C), resp. rate 19, last menstrual period 11/12/2018, SpO2 100 %.  Physical Exam  Constitutional: She is oriented to person, place, and time. She appears well-developed and well-nourished.  HENT:  Head: Normocephalic.  Eyes: Pupils are equal, round, and reactive to light.  Genitourinary:    Genitourinary Comments: Vagina - Moderate amount of dark red blood in the vagina. Large clot noted, thin, 2-3 cm in size.  Cervix - + active bleeding. Tissue like piece noted at os. cervix appears 2 cm dilated.  Bimanual exam: deferred  Chaperone present for exam.    Musculoskeletal:        General: Normal range of motion.     Cervical back: Neck supple.  Neurological: She is alert and oriented to person, place, and time.  Skin: Skin is warm.  Psychiatric: Her behavior is normal.    MAU Course  Procedures  None  MDM  A positive blood type Toradol and ibuprofen given IV IV started CBC stable Discussed patient with Dr. Hulan Fray. She would like the patient  to be offered Cytotec. If agreeable she will  stay in MAU for observation.  Report given to Wynelle Bourgeois CNM who resumes care of the patient.  Patient is agreeable to cytotec.  RN instructed to do orthostatic vitals during stay.   Duane Lope, NP 01/31/2019 9:13 PM  Assumed care Passed clots and blood with cramping Orthostatic VS done and patient demonstrated orthostasis. IV fluids given x 3 liters to improve her hydration status US done  US OB Transvaginal  Result Date: 02/01/2019 CLINICAL DATA:  24 year old female with recent spontaneous abortion. EXAM: TRANSVAGINAL OB ULTRASOUND TECHNIQUE: Transvaginal ultrasound was performed for complete evaluation of the gestation as well as the maternal uterus, adnexal regions, and pelvic cul-de-sac. COMPARISON:  Obstetrical ultrasound dated 01/30/2019. FINDINGS: The uterus is anteverted. The endometrium is thickened and heterogeneous measuring approximately 17 mm in thickness. No intrauterine pregnancy identified in keeping with recent spontaneous abortion. There is increased vascularity within the endometrial content concerning for retained products of conception. The ovaries are unremarkable. No significant free fluid within the pelvis. IMPRESSION: Recent spontaneous abortion with sonographic findings most consistent with retained products of conception. Clinical correlation is recommended. Electronically Signed   By: Elgie Collard M.D.   On: 02/01/2019 00:29   Consulted Dr Marice Potter who recommends more hydration and observation then conservative care at home  After 3rd liter, felt better and was no longer orthostatic  Discharged home  Bleeding precautions Motrin for pain Followup 3-4 weeks  Aviva Signs, CNM  Assessment and Plan

## 2019-02-01 ENCOUNTER — Telehealth: Payer: Self-pay | Admitting: Family Medicine

## 2019-02-01 ENCOUNTER — Other Ambulatory Visit: Payer: Self-pay | Admitting: Obstetrics and Gynecology

## 2019-02-01 MED ORDER — SODIUM CHLORIDE 0.9 % IV BOLUS
1000.0000 mL | Freq: Once | INTRAVENOUS | Status: AC
  Administered 2019-02-01: 1000 mL via INTRAVENOUS

## 2019-02-01 MED ORDER — IBUPROFEN 600 MG PO TABS
600.0000 mg | ORAL_TABLET | Freq: Once | ORAL | Status: AC
  Administered 2019-02-01: 600 mg via ORAL
  Filled 2019-02-01: qty 1

## 2019-02-01 NOTE — Telephone Encounter (Signed)
Called patient stating I am trying to reach her to return her phone call. Patient states she started to have spotting when she left our office and yesterday was bleeding extremely heavy. She states she ended up in the ER. Today she reports light bleeding only. She would like to know if D&E is still needed. Discussed with Venia Carbon who states likely not, but patient should follow up with provider on [place] for repeat evaluation before D&E is cancelled. Discussed with patient and told her someone from the front office will call her with that appt. Patient verbalized understanding & had no other questions.

## 2019-02-01 NOTE — Telephone Encounter (Signed)
Patient is requesting a call back today. She wants to know if she still needs to get a D&C because she has been bleeding.

## 2019-02-01 NOTE — Discharge Instructions (Signed)

## 2019-02-02 ENCOUNTER — Telehealth: Payer: Self-pay | Admitting: Obstetrics & Gynecology

## 2019-02-02 NOTE — Telephone Encounter (Signed)
Nurse Addison Naegeli transferred the call from the patient inquiring information about an upcoming visit for an preop appointment. After reviewing the patient chart it was found the patient attends the [facility]. The patient was under the impression she was a patient at our office due to the u/s results completed recently. Explained to the process of u/s results reading with our office and the location of where she receives care. The patient become upset and started to use vulgar language. The patient stated she has never been to [facility] and has only visited the office on [address]. Informed the patient of Su Hilt Battle's contact number in regards to the surgery. Advised of the covid testing date and time for tomorrow. Reviewed the chart and found a note of the patient to be scheduled for Monday however front desk was not notified. There is also no surgeon available to see the patient on Monday. The patient was advised of an appointment available on Tuesday as well as the address and contact information. The patient verbalized understanding and agreed to the dates and time of the appointments.

## 2019-02-02 NOTE — Progress Notes (Signed)
Spoke with pt who was very tearful. Pt states that she has an appt on Monday to see if she will still need D&E but she does not know at what time. Unable to reach to office to find out when. Instructed on covid testing Sat and quarantine procedure in case she will still need surgery. Will complete pre op call Monday if pt does keep surgery appointment.

## 2019-02-03 ENCOUNTER — Inpatient Hospital Stay (HOSPITAL_COMMUNITY)
Admission: AD | Admit: 2019-02-03 | Discharge: 2019-02-03 | Disposition: A | Discharge status: Home / Self Care | Payer: Medicaid Other | Attending: Obstetrics and Gynecology | Admitting: Obstetrics and Gynecology

## 2019-02-03 ENCOUNTER — Inpatient Hospital Stay (HOSPITAL_COMMUNITY): Payer: Medicaid Other

## 2019-02-03 ENCOUNTER — Encounter (HOSPITAL_COMMUNITY): Payer: Self-pay | Admitting: Obstetrics and Gynecology

## 2019-02-03 ENCOUNTER — Other Ambulatory Visit (HOSPITAL_COMMUNITY)
Admission: RE | Admit: 2019-02-03 | Discharge: 2019-02-03 | Disposition: A | Discharge status: Home / Self Care | Payer: Medicaid Other | Source: Ambulatory Visit | Attending: Obstetrics and Gynecology | Admitting: Obstetrics and Gynecology

## 2019-02-03 ENCOUNTER — Other Ambulatory Visit: Payer: Self-pay

## 2019-02-03 DIAGNOSIS — Z8249 Family history of ischemic heart disease and other diseases of the circulatory system: Secondary | ICD-10-CM | POA: Insufficient documentation

## 2019-02-03 DIAGNOSIS — F419 Anxiety disorder, unspecified: Secondary | ICD-10-CM | POA: Diagnosis not present

## 2019-02-03 DIAGNOSIS — Z20822 Contact with and (suspected) exposure to covid-19: Secondary | ICD-10-CM | POA: Diagnosis not present

## 2019-02-03 DIAGNOSIS — O021 Missed abortion: Secondary | ICD-10-CM | POA: Diagnosis present

## 2019-02-03 DIAGNOSIS — O99281 Endocrine, nutritional and metabolic diseases complicating pregnancy, first trimester: Secondary | ICD-10-CM | POA: Insufficient documentation

## 2019-02-03 DIAGNOSIS — E079 Disorder of thyroid, unspecified: Secondary | ICD-10-CM | POA: Diagnosis not present

## 2019-02-03 DIAGNOSIS — Z560 Unemployment, unspecified: Secondary | ICD-10-CM | POA: Insufficient documentation

## 2019-02-03 DIAGNOSIS — Z01812 Encounter for preprocedural laboratory examination: Secondary | ICD-10-CM | POA: Insufficient documentation

## 2019-02-03 DIAGNOSIS — O99341 Other mental disorders complicating pregnancy, first trimester: Secondary | ICD-10-CM | POA: Diagnosis not present

## 2019-02-03 DIAGNOSIS — F329 Major depressive disorder, single episode, unspecified: Secondary | ICD-10-CM | POA: Diagnosis not present

## 2019-02-03 DIAGNOSIS — Z87891 Personal history of nicotine dependence: Secondary | ICD-10-CM | POA: Diagnosis not present

## 2019-02-03 LAB — CBC WITH DIFFERENTIAL/PLATELET
Abs Immature Granulocytes: 0.04 10*3/uL (ref 0.00–0.07)
Basophils Absolute: 0 10*3/uL (ref 0.0–0.1)
Basophils Relative: 0 %
Eosinophils Absolute: 0.1 10*3/uL (ref 0.0–0.5)
Eosinophils Relative: 2 %
HCT: 25.9 % — ABNORMAL LOW (ref 36.0–46.0)
Hemoglobin: 8.6 g/dL — ABNORMAL LOW (ref 12.0–15.0)
Immature Granulocytes: 1 %
Lymphocytes Relative: 21 %
Lymphs Abs: 1.5 10*3/uL (ref 0.7–4.0)
MCH: 31.6 pg (ref 26.0–34.0)
MCHC: 33.2 g/dL (ref 30.0–36.0)
MCV: 95.2 fL (ref 80.0–100.0)
Monocytes Absolute: 0.3 10*3/uL (ref 0.1–1.0)
Monocytes Relative: 4 %
Neutro Abs: 5.2 10*3/uL (ref 1.7–7.7)
Neutrophils Relative %: 72 %
Platelets: 231 10*3/uL (ref 150–400)
RBC: 2.72 MIL/uL — ABNORMAL LOW (ref 3.87–5.11)
RDW: 12.6 % (ref 11.5–15.5)
WBC: 7.2 10*3/uL (ref 4.0–10.5)
nRBC: 0 % (ref 0.0–0.2)

## 2019-02-03 LAB — HEMOGLOBIN AND HEMATOCRIT, BLOOD
HCT: 25.1 % — ABNORMAL LOW (ref 36.0–46.0)
Hemoglobin: 8.2 g/dL — ABNORMAL LOW (ref 12.0–15.0)

## 2019-02-03 MED ORDER — OXYCODONE HCL 5 MG PO TABS: 5.0000 mg | ORAL_TABLET | Freq: Four times a day (QID) | ORAL | 0 refills | Status: DC | PRN

## 2019-02-03 MED ORDER — PROMETHAZINE HCL 25 MG/ML IJ SOLN
12.5000 mg | Freq: Once | INTRAMUSCULAR | Status: AC
  Administered 2019-02-03: 12.5 mg via INTRAMUSCULAR
  Filled 2019-02-03: qty 1

## 2019-02-03 MED ORDER — HYDROMORPHONE HCL 1 MG/ML IJ SOLN
1.0000 mg | Freq: Once | INTRAMUSCULAR | Status: AC
  Administered 2019-02-03: 1 mg via INTRAMUSCULAR
  Filled 2019-02-03: qty 1

## 2019-02-03 NOTE — MAU Provider Note (Signed)
Chief Complaint: Abdominal Pain, Back Pain, and Dizziness   First Provider Initiated Contact with Patient 02/03/19 1318     SUBJECTIVE HPI: Jody Carter is a 24 y.o. [id number] who presents to [facility] Admissions reporting abdominal pain and dizziness. Patient was diagnosed with retained products of conception the other day after having a twin miscarriage. Is scheduled for a D&C on Wednesday.  Reports abdominal pain has worsened since yesterday. Took 2 percocet yesterday & 2 ibuprofen this morning without relief. Had episode of dizziness last night & feels like she's been a little dizzy today. Vaginal bleeding has lessened since her MAU visit on the 6th. Denies fever/chills.   Location: abdomen & back Quality: cramping Severity: 8/10 on pain scale Duration: 2 days Timing: constant Modifying factors: nothing makes worse. Not improved with pain medication Associated signs and symptoms: dizziness & vaginal bleeding  Past Medical History:  Diagnosis Date  . Anxiety   . Depression   . Hx of migraines   . Panic disorder   . Thyroid disease    OB History  Gravida Para Term Preterm AB Living  4 1 1   2 1   SAB TAB Ectopic Multiple Live Births  1 1   0 1    # Outcome Date GA Lbr Len/2nd Weight Sex Delivery Anes PTL Lv  4 Term 02/19/14 [redacted]w[redacted]d 13:55 / 00:13 3737 g F Vag-Spont EPI  LIV  3 Gravida           2 TAB           1 SAB            Past Surgical History:  Procedure Laterality Date  . ADENOIDECTOMY    . BREAST SURGERY     augmentation  . CHOLECYSTECTOMY    . EYE SURGERY Bilateral    closed tear ducts opened as child  . TONSILLECTOMY     Social History   Socioeconomic History  . Marital status: Married    Spouse name: Not on file  . Number of children: Not on file  . Years of education: Not on file  . Highest education level: Not on file  Occupational History  . Occupation: unemployed  Tobacco Use  . Smoking status: Former Smoker    Types: E-cigarettes  . Smokeless  tobacco: Never Used  Substance and Sexual Activity  . Alcohol use: No  . Drug use: No  . Sexual activity: Yes    Birth control/protection: None  Other Topics Concern  . Not on file  Social History Narrative  . Not on file   Social Determinants of Health   Financial Resource Strain:   . Difficulty of Paying Living Expenses: Not on file  Food Insecurity:   . Worried About [redacted]w[redacted]d in the Last Year: Not on file  . Ran Out of Food in the Last Year: Not on file  Transportation Needs:   . Lack of Transportation (Medical): Not on file  . Lack of Transportation (Non-Medical): Not on file  Physical Activity:   . Days of Exercise per Week: Not on file  . Minutes of Exercise per Session: Not on file  Stress:   . Feeling of Stress : Not on file  Social Connections:   . Frequency of Communication with Friends and Family: Not on file  . Frequency of Social Gatherings with Friends and Family: Not on file  . Attends Religious Services: Not on file  . Active Member of Clubs or Organizations: Not  on file  . Attends Banker Meetings: Not on file  . Marital Status: Not on file  Intimate Partner Violence:   . Fear of Current or Ex-Partner: Not on file  . Emotionally Abused: Not on file  . Physically Abused: Not on file  . Sexually Abused: Not on file   Family History  Problem Relation Age of Onset  . Heart attack Father   . Diabetes Paternal Grandfather   . Hypertension Maternal Grandmother   . Heart attack Maternal Grandmother   . Hypertension Maternal Grandfather   . Heart attack Maternal Grandfather    No current facility-administered medications on file prior to encounter.   Current Outpatient Medications on File Prior to Encounter  Medication Sig Dispense Refill  . ibuprofen (ADVIL) 800 MG tablet Take 1 tablet (800 mg total) by mouth every 8 (eight) hours as needed. 30 tablet 0  . levothyroxine (SYNTHROID, LEVOTHROID) 88 MCG tablet Take 1 tablet (88 mcg  total) by mouth daily before breakfast. For hypothyroidism 30 tablet 0  . oxyCODONE-acetaminophen (PERCOCET) 10-325 MG tablet Take 1 tablet by mouth every 4 (four) hours as needed for pain. Bring to your appointment. You will take one at the office prior to the procedure 2 tablet 0   Allergies  Allergen Reactions  . Sumatriptan Other (See Comments)    Neck and chest pressure, parasthesias    I have reviewed patient's Past Medical Hx, Surgical Hx, Family Hx, Social Hx, medications and allergies.   Review of Systems  Constitutional: Negative.   Cardiovascular: Negative for palpitations.  Gastrointestinal: Positive for abdominal pain. Negative for constipation, diarrhea, nausea and vomiting.  Genitourinary: Positive for vaginal bleeding. Negative for vaginal discharge.  Neurological: Positive for dizziness. Negative for syncope and headaches.    OBJECTIVE Patient Vitals for the past 24 hrs:  BP Temp Temp src Pulse Resp SpO2  02/03/19 1555 (!) 104/50 - - 62 - 99 %  02/03/19 1554 (!) 104/50 98 F (36.7 C) Oral 65 16 -  02/03/19 1259 (!) 109/57 98.6 F (37 C) - 90 18 97 %   Orthostatic VS for the past 24 hrs:  BP- Lying Pulse- Lying BP- Sitting Pulse- Sitting BP- Standing at 0 minutes Pulse- Standing at 0 minutes  02/03/19 1355 104/44 72 111/64 78 118/61 107      Constitutional: Well-developed, well-nourished female in no acute distress.  Cardiovascular: normal rate & rhythm, no murmur Respiratory: normal rate and effort. Lung sounds clear throughout GI: Abd soft, non-tender, Pos BS x 4. No guarding or rebound tenderness MS: Extremities nontender, no edema, normal ROM Neurologic: Alert and oriented x 4.    LAB RESULTS Results for orders placed or performed during the hospital encounter of 02/03/19 (from the past 24 hour(s))  CBC with Differential     Status: Abnormal   Collection Time: 02/03/19  1:49 PM  Result Value Ref Range   WBC 7.2 4.0 - 10.5 K/uL   RBC 2.72 (L) 3.87 -  5.11 MIL/uL   Hemoglobin 8.6 (L) 12.0 - 15.0 g/dL   HCT 08.6 (L) 57.8 - 46.9 %   MCV 95.2 80.0 - 100.0 fL   MCH 31.6 26.0 - 34.0 pg   MCHC 33.2 30.0 - 36.0 g/dL   RDW 62.9 52.8 - 41.3 %   Platelets 231 150 - 400 K/uL   nRBC 0.0 0.0 - 0.2 %   Neutrophils Relative % 72 %   Neutro Abs 5.2 1.7 - 7.7 K/uL   Lymphocytes  Relative 21 %   Lymphs Abs 1.5 0.7 - 4.0 K/uL   Monocytes Relative 4 %   Monocytes Absolute 0.3 0.1 - 1.0 K/uL   Eosinophils Relative 2 %   Eosinophils Absolute 0.1 0.0 - 0.5 K/uL   Basophils Relative 0 %   Basophils Absolute 0.0 0.0 - 0.1 K/uL   Immature Granulocytes 1 %   Abs Immature Granulocytes 0.04 0.00 - 0.07 K/uL  H&H     Status: Abnormal   Collection Time: 02/03/19  3:46 PM  Result Value Ref Range   Hemoglobin 8.2 (L) 12.0 - 15.0 g/dL   HCT 09.8 (L) 11.9 - 14.7 %    IMAGING US OB Transvaginal  Result Date: 02/03/2019 CLINICAL DATA:  Retained products of conception. EXAM: OBSTETRIC <14 WK [place] AND TRANSVAGINAL OB US TECHNIQUE: Both transabdominal and transvaginal ultrasound examinations were performed for complete evaluation of the gestation as well as the maternal uterus, adnexal regions, and pelvic cul-de-sac. Transvaginal technique was performed to assess early pregnancy. COMPARISON:  None. FINDINGS: Intrauterine gestational sac: None Yolk sac:  Not Visualized. Embryo:  Not Visualized. Cardiac Activity: Not Visualized. Maternal uterus/adnexae: The right ovary measures 3.5 x 1.6 x 1.2 cm. The left ovary measures 3.7 x 2.5 x 2.1 cm. The endometrial canal is thickened and heterogeneous with increased vascularity. The endometrial thickness measures currently 2.5 cm. IMPRESSION: 1. No IUP identified. 2. Thickened heterogeneous endometrial stripe with increased vascularity raises concern for retained products of conception in the appropriate clinical setting. Electronically Signed   By: Katherine Mantle M.D.   On: 02/03/2019 15:02    MAU COURSE Orders Placed This  Encounter  Procedures  . US OB Transvaginal  . CBC with Differential  . H&H  . Orthostatic vital signs  . Discharge patient   Meds ordered this encounter  Medications  . HYDROmorphone (DILAUDID) injection 1 mg  . promethazine (PHENERGAN) injection 12.5 mg  . oxyCODONE (ROXICODONE) 5 MG immediate release tablet    Sig: Take 1 tablet (5 mg total) by mouth every 6 (six) hours as needed for up to 5 days for breakthrough pain.    Dispense:  20 tablet    Refill:  0    Order Specific Question:   Supervising Provider    Answer:   Stapleton Bing [[id number]]    MDM Vital signs stable & pt afebrile. CBC - no leukocytosis. Hemoglobin has dropped 11.9>8.6 since Wednesday. Not orthostatic. Rechecked H/H per Dr. Vergie Living. Hemoglobin 8.2, this is 2 hours after initial check. Vital signs remain stable & patient ambulating around her room without assistance.  Ultrasound confirms continued retained products. Will plan for patient to keep upcoming appointments.   Pain improved with IM dilaudid in MAU. Will send rx for oxycodone. Pt to use ibuprofen & tylenol primarily.   ASSESSMENT 1. Retained products of conception, early pregnancy     PLAN Discharge home in stable condition. Bleeding & infection precautions Rx oxycodone #20 prn breakthrough pain  Allergies as of 02/03/2019      Reactions   Sumatriptan Other (See Comments)   Neck and chest pressure, parasthesias      Medication List    STOP taking these medications   oxyCODONE-acetaminophen 10-325 MG tablet Commonly known as: Percocet     TAKE these medications   ibuprofen 800 MG tablet Commonly known as: ADVIL Take 1 tablet (800 mg total) by mouth every 8 (eight) hours as needed.   levothyroxine 88 MCG tablet Commonly known as: SYNTHROID Take 1 tablet (  88 mcg total) by mouth daily before breakfast. For hypothyroidism   oxyCODONE 5 MG immediate release tablet Commonly known as: Roxicodone Take 1 tablet (5 mg total) by mouth every  6 (six) hours as needed for up to 5 days for breakthrough pain.        Jorje Guild, NP 02/03/2019  4:39 PM

## 2019-02-03 NOTE — Discharge Instructions (Signed)
Return to care   If you have heavier bleeding that soaks through more that 2 pads per hour for an hour or more  If you bleed so much that you feel like you might pass out or you do pass out  If you have significant abdominal pain that is not improved with Tylenol   If you develop a fever > 100.5      Miscarriage A miscarriage is the loss of an unborn baby (fetus) before the 20th week of pregnancy. Follow these instructions at home: Medicines   Take over-the-counter and prescription medicines only as told by your doctor.  If you were prescribed antibiotic medicine, take it as told by your doctor. Do not stop taking the antibiotic even if you start to feel better.  Do not take NSAIDs unless your doctor says that this is safe for you. NSAIDs include aspirin and ibuprofen. These medicines can cause bleeding. Activity  Rest as directed. Ask your doctor what activities are safe for you.  Have someone help you at home during this time. General instructions  Write down how many pads you use each day and how soaked they are.  Watch the amount of tissue or clumps of blood (blood clots) that you pass from your vagina. Save any large amounts of tissue for your doctor.  Do not use tampons, douche, or have sex until your doctor approves.  To help you and your partner with the process of grieving, talk with your doctor or seek counseling.  When you are ready, meet with your doctor to talk about steps you should take for your health. Also, talk with your doctor about steps to take to have a healthy pregnancy in the future.  Keep all follow-up visits as told by your doctor. This is important. Contact a doctor if:  You have a fever or chills.  You have vaginal discharge that smells bad.  You have more bleeding. Get help right away if:  You have very bad cramps or pain in your back or belly.  You pass clumps of blood that are walnut-sized or larger from your vagina.  You pass  tissue that is walnut-sized or larger from your vagina.  You soak more than 1 regular pad in an hour.  You get light-headed or weak.  You faint (pass out).  You have feelings of sadness that do not go away, or you have thoughts of hurting yourself. Summary  A miscarriage is the loss of an unborn baby before the 20th week of pregnancy.  Follow your doctor's instructions for home care. Keep all follow-up appointments.  To help you and your partner with the process of grieving, talk with your doctor or seek counseling. This information is not intended to replace advice given to you by your health care provider. Make sure you discuss any questions you have with your health care provider. Document Revised: 05/05/2018 Document Reviewed: 02/17/2016 Elsevier Patient Education  2020 [employer].

## 2019-02-03 NOTE — MAU Note (Signed)
Pt reports to [facility] with c/o dizziness and lower abd. And back pain.  Pt reports her bleeding has lessened since she was last in [facility].  Pt reports she took 800mg  of ibuprofen around 0900 this morning.

## 2019-02-04 LAB — NOVEL CORONAVIRUS, NAA (HOSP ORDER, SEND-OUT TO REF LAB; TAT 18-24 HRS): SARS-CoV-2, NAA: NOT DETECTED

## 2019-02-05 ENCOUNTER — Telehealth: Payer: Self-pay | Admitting: Obstetrics and Gynecology

## 2019-02-05 ENCOUNTER — Other Ambulatory Visit (HOSPITAL_COMMUNITY)
Admission: RE | Admit: 2019-02-05 | Discharge: 2019-02-05 | Disposition: A | Discharge status: Home / Self Care | Payer: Medicaid Other | Source: Ambulatory Visit | Attending: Obstetrics and Gynecology | Admitting: Obstetrics and Gynecology

## 2019-02-05 ENCOUNTER — Telehealth: Payer: Self-pay | Admitting: Family Medicine

## 2019-02-05 ENCOUNTER — Other Ambulatory Visit: Payer: Self-pay

## 2019-02-05 ENCOUNTER — Encounter (HOSPITAL_BASED_OUTPATIENT_CLINIC_OR_DEPARTMENT_OTHER): Payer: Self-pay | Admitting: Obstetrics and Gynecology

## 2019-02-05 DIAGNOSIS — Z01812 Encounter for preprocedural laboratory examination: Secondary | ICD-10-CM | POA: Diagnosis present

## 2019-02-05 DIAGNOSIS — Z20822 Contact with and (suspected) exposure to covid-19: Secondary | ICD-10-CM | POA: Insufficient documentation

## 2019-02-05 LAB — SARS CORONAVIRUS 2 (TAT 6-24 HRS): SARS Coronavirus 2: NEGATIVE

## 2019-02-05 NOTE — Progress Notes (Signed)
Pt call picked up from the nurses station phone.  Pt was very upset and demanded to speak with someone who could help her.  I informed pt that our clinical manager is currently in a meeting and that I could have her call her back.  Pt asked if I could help her.  I apologized to the pt for her being upset and asked what has happened to cause her to be upset.  Pt stated that every time she calls this office someone from the front office is very rude.  Pt stated that if she does not get help she would go and get a Clinical research associate.  Pt stated that her appt for tomorrow was canceled and she does not know why because she is not passing anything and she is going to need to the surgery.  I apologized to the pt and informed her that I would need to place her on hold so that I can get more info about why her appt was canceled.  Pt agreed to be placed on hold.  Spoke with [place] who received message from [place] to cancel pre-op visit with Ervin on 02/06/19.  Called Rhea Bleacher and was advised that Dr. Jolayne Panther recommended to [place] that pt did not need surgery.  With Judeth Horn, NP on speaker Rhea Bleacher was advised that the pt needs to keep her pre-op appt with Dr. Alysia Penna and that the surgery time should be held until after she speaks with Dr. Alysia Penna to see if pt still needs it.  Rhea Bleacher verbalized understanding and the pt's 02/07/19 surgery date will not change until after visit in which pt and provider will come up with plan.  Pt notified of recommendation that she will come in tomorrow on 02/06/19 to speak with the provider about management in which if her and the provider decide on surgery her surgery date will continue to be on 02/07/19.  Pt verbalized understanding and stated thank you.  Addison Naegeli, RN 02/05/19

## 2019-02-05 NOTE — Telephone Encounter (Signed)
Spoke to patient to let her know that her appointment on 1/12 has been cancelled due to her needing to quarantine after her covid testing on 1/11 until 1/13 the day of her surgery. Patient instructed that we will be scheduling her a post-op appointment after her surgery has been completed. Patient verbalized understanding and her appointment on 1/12 has been cancelled.

## 2019-02-05 NOTE — Telephone Encounter (Signed)
Called patient in regards to a message that was received in the in-basket from [place] to change her appointment on 1/12 due to the concern being handled in MAU and no longer needing surgery. Patient was informed of this change and instantly got upset and starting yelling, cursing and asking if she needs to get her lawyer involved because she is not being treated right. I tried to explain to the patient the reason behind what was going on but she kept interrupting. Patient was put on a hold after being told to have a nice day if she was not going to stop yelling and cursing. Patient stated she would like to speak to a Production designer, theatre/television/film. Patient has put on hold to see if Mayra Neer was available to speak to the patient. She was not available when came back to tell patient this she hung up. Addison Naegeli came back up and stated she seen the message [place] sent but the patient can still be seen tomorrow. Patient was placed back on the schedule for 1/12 @ 10:35. Addison Naegeli stated she would reach out to [place] to let her know what is going on. Mayra Neer stated that she would also reach out to the patient about this matter.

## 2019-02-06 ENCOUNTER — Ambulatory Visit: Payer: Medicaid Other | Admitting: Obstetrics and Gynecology

## 2019-02-06 NOTE — H&P (Signed)
Jody Carter is an 24 y.o. female P2 diagnosed with retained products of conception following a miscarriage on 01/31/2019 here for scheduled dilatation and evacuation. Patient reports doing well without complaints. She reports some persistent vaginal bleeding as spotting since her 02/03/19 MAU visit.   Menstrual History: Patient's last menstrual period was 11/12/2018.    Past Medical History:  Diagnosis Date  . Anxiety   . Depression   . Hx of migraines   . Panic disorder   . Thyroid disease     Past Surgical History:  Procedure Laterality Date  . ADENOIDECTOMY    . BREAST SURGERY     augmentation  . CHOLECYSTECTOMY    . EYE SURGERY Bilateral    closed tear ducts opened as child  . TONSILLECTOMY      Family History  Problem Relation Age of Onset  . Heart attack Father   . Diabetes Paternal Grandfather   . Hypertension Maternal Grandmother   . Heart attack Maternal Grandmother   . Hypertension Maternal Grandfather   . Heart attack Maternal Grandfather     Social History:  reports that she has quit smoking. Her smoking use included e-cigarettes. She has never used smokeless tobacco. She reports that she does not drink alcohol or use drugs.  Allergies:  Allergies  Allergen Reactions  . Sumatriptan Other (See Comments)    Neck and chest pressure, parasthesias    Medications Prior to Admission  Medication Sig Dispense Refill Last Dose  . ibuprofen (ADVIL) 800 MG tablet Take 1 tablet (800 mg total) by mouth every 8 (eight) hours as needed. 30 tablet 0 02/06/2019 at 1600  . levothyroxine (SYNTHROID, LEVOTHROID) 88 MCG tablet Take 1 tablet (88 mcg total) by mouth daily before breakfast. For hypothyroidism 30 tablet 0 02/07/2019 at 0730  . oxyCODONE (ROXICODONE) 5 MG immediate release tablet Take 1 tablet (5 mg total) by mouth every 6 (six) hours as needed for up to 5 days for breakthrough pain. 20 tablet 0 02/06/2019 at 2100    Review of Systems See pertinent in  HPI Blood pressure 116/64, pulse 86, temperature 98 F (36.7 C), temperature source Tympanic, resp. rate 16, height 5\' 3"  (1.6 m), weight 61.8 kg, last menstrual period 11/12/2018, SpO2 100 %, not currently breastfeeding. Physical Exam GENERAL: Well-developed, well-nourished female in no acute distress.  LUNGS: Clear to auscultation bilaterally.  HEART: Regular rate and rhythm. ABDOMEN: Soft, nontender, nondistended. No organomegaly. PELVIC: Deferred to OR EXTREMITIES: No cyanosis, clubbing, or edema, 2+ distal pulses.  No results found for this or any previous visit (from the past 24 hour(s)). 11/14/2018 OB Comp Less 14 Wks  Result Date: 01/30/2019 CLINICAL DATA:  Positive pregnancy test. Unsure of LMP. Evaluate dating and viability. EXAM: TWIN OBSTETRIC <14WK 03/30/2019 AND TRANSVAGINAL OB US COMPARISON:  None. FINDINGS: Number of IUPs:  2 Chorionicity/Amnionicity:  Monochorionic-diamniotic (thin membrane) TWIN 1 Yolk sac:  Not Visualized. Embryo:  Visualized. Cardiac Activity: Not Visualized. Heart Rate: Absent CRL:  8 mm   6 w 5 d                  [place] EDC: 09/20/2019 TWIN 2 Yolk sac:  Not Visualized. Embryo:  Visualized. Cardiac Activity: Not Visualized. Heart Rate: Absent CRL:  9 mm   6 w 6 d                  09/22/2019 EDC: 09/19/2019 Subchorionic hemorrhage:  None visualized. Maternal uterus/adnexae: Both ovaries are normal in appearance. No  mass or abnormal free fluid identified. IMPRESSION: Findings meet definitive criteria for a failed twin pregnancy. This follows SRU consensus guidelines: Diagnostic Criteria for Nonviable Pregnancy Early in the First Trimester. [facility] J Med [phone]. Electronically Signed   By: Danae Orleans M.D.   On: 01/30/2019 13:47   US OB Comp AddL Gest Less 14 Wks  Result Date: 01/30/2019 CLINICAL DATA:  Positive pregnancy test. Unsure of LMP. Evaluate dating and viability. EXAM: TWIN OBSTETRIC <14WK [place] AND TRANSVAGINAL OB US COMPARISON:  None. FINDINGS: Number of IUPs:  2  Chorionicity/Amnionicity:  Monochorionic-diamniotic (thin membrane) TWIN 1 Yolk sac:  Not Visualized. Embryo:  Visualized. Cardiac Activity: Not Visualized. Heart Rate: Absent CRL:  8 mm   6 w 5 d                  [place] EDC: 09/20/2019 TWIN 2 Yolk sac:  Not Visualized. Embryo:  Visualized. Cardiac Activity: Not Visualized. Heart Rate: Absent CRL:  9 mm   6 w 6 d                  [place] EDC: 09/19/2019 Subchorionic hemorrhage:  None visualized. Maternal uterus/adnexae: Both ovaries are normal in appearance. No mass or abnormal free fluid identified. IMPRESSION: Findings meet definitive criteria for a failed twin pregnancy. This follows SRU consensus guidelines: Diagnostic Criteria for Nonviable Pregnancy Early in the First Trimester. [facility] J Med [phone]. Electronically Signed   By: Danae Orleans M.D.   On: 01/30/2019 13:47   US OB Transvaginal  Result Date: 02/03/2019 CLINICAL DATA:  Retained products of conception. EXAM: OBSTETRIC <14 WK [place] AND TRANSVAGINAL OB US TECHNIQUE: Both transabdominal and transvaginal ultrasound examinations were performed for complete evaluation of the gestation as well as the maternal uterus, adnexal regions, and pelvic cul-de-sac. Transvaginal technique was performed to assess early pregnancy. COMPARISON:  None. FINDINGS: Intrauterine gestational sac: None Yolk sac:  Not Visualized. Embryo:  Not Visualized. Cardiac Activity: Not Visualized. Maternal uterus/adnexae: The right ovary measures 3.5 x 1.6 x 1.2 cm. The left ovary measures 3.7 x 2.5 x 2.1 cm. The endometrial canal is thickened and heterogeneous with increased vascularity. The endometrial thickness measures currently 2.5 cm. IMPRESSION: 1. No IUP identified. 2. Thickened heterogeneous endometrial stripe with increased vascularity raises concern for retained products of conception in the appropriate clinical setting. Electronically Signed   By: Katherine Mantle M.D.   On: 02/03/2019 15:02   US OB Transvaginal  Result  Date: 02/01/2019 CLINICAL DATA:  24 year old female with recent spontaneous abortion. EXAM: TRANSVAGINAL OB ULTRASOUND TECHNIQUE: Transvaginal ultrasound was performed for complete evaluation of the gestation as well as the maternal uterus, adnexal regions, and pelvic cul-de-sac. COMPARISON:  Obstetrical ultrasound dated 01/30/2019. FINDINGS: The uterus is anteverted. The endometrium is thickened and heterogeneous measuring approximately 17 mm in thickness. No intrauterine pregnancy identified in keeping with recent spontaneous abortion. There is increased vascularity within the endometrial content concerning for retained products of conception. The ovaries are unremarkable. No significant free fluid within the pelvis. IMPRESSION: Recent spontaneous abortion with sonographic findings most consistent with retained products of conception. Clinical correlation is recommended. Electronically Signed   By: Elgie Collard M.D.   On: 02/01/2019 00:29   US OB Transvaginal  Result Date: 01/30/2019 CLINICAL DATA:  Positive pregnancy test. Unsure of LMP. Evaluate dating and viability. EXAM: TWIN OBSTETRIC <14WK [place] AND TRANSVAGINAL OB US COMPARISON:  None. FINDINGS: Number of IUPs:  2 Chorionicity/Amnionicity:  Monochorionic-diamniotic (thin membrane) TWIN 1 Yolk  sac:  Not Visualized. Embryo:  Visualized. Cardiac Activity: Not Visualized. Heart Rate: Absent CRL:  8 mm   6 w 5 d                  [place] EDC: 09/20/2019 TWIN 2 Yolk sac:  Not Visualized. Embryo:  Visualized. Cardiac Activity: Not Visualized. Heart Rate: Absent CRL:  9 mm   6 w 6 d                  [place] EDC: 09/19/2019 Subchorionic hemorrhage:  None visualized. Maternal uterus/adnexae: Both ovaries are normal in appearance. No mass or abnormal free fluid identified. IMPRESSION: Findings meet definitive criteria for a failed twin pregnancy. This follows SRU consensus guidelines: Diagnostic Criteria for Nonviable Pregnancy Early in the First Trimester. Alison Stalling J Med  [phone]. Electronically Signed   By: Marlaine Hind M.D.   On: 01/30/2019 13:47    No results found.  Assessment/Plan: 24 yo with ultrasound findings consistent with retained POC here for D&E - Risks, benefits and alternatives were explained including but not limited to risks of bleeding, infection, uterine perforation and damage to adjacent organs - Patient verbalized understanding and all questions were answered Jason Hauge 02/07/2019, 9:50 AM

## 2019-02-07 ENCOUNTER — Ambulatory Visit (HOSPITAL_BASED_OUTPATIENT_CLINIC_OR_DEPARTMENT_OTHER)
Admission: RE | Admit: 2019-02-07 | Payer: Medicaid Other | Source: Home / Self Care | Admitting: Obstetrics and Gynecology

## 2019-02-07 ENCOUNTER — Observation Stay (HOSPITAL_BASED_OUTPATIENT_CLINIC_OR_DEPARTMENT_OTHER)
Admission: RE | Admit: 2019-02-07 | Discharge: 2019-02-08 | Disposition: A | Discharge status: Home / Self Care | Payer: Medicaid Other | Attending: Obstetrics and Gynecology | Admitting: Obstetrics and Gynecology

## 2019-02-07 ENCOUNTER — Encounter (HOSPITAL_BASED_OUTPATIENT_CLINIC_OR_DEPARTMENT_OTHER): Payer: Self-pay | Admitting: Obstetrics and Gynecology

## 2019-02-07 ENCOUNTER — Other Ambulatory Visit: Payer: Self-pay

## 2019-02-07 ENCOUNTER — Ambulatory Visit (HOSPITAL_BASED_OUTPATIENT_CLINIC_OR_DEPARTMENT_OTHER): Payer: Medicaid Other | Admitting: Certified Registered Nurse Anesthetist

## 2019-02-07 ENCOUNTER — Encounter (HOSPITAL_BASED_OUTPATIENT_CLINIC_OR_DEPARTMENT_OTHER)
Admission: RE | Disposition: A | Discharge status: Home / Self Care | Payer: Self-pay | Source: Home / Self Care | Attending: Obstetrics and Gynecology

## 2019-02-07 ENCOUNTER — Encounter (HOSPITAL_BASED_OUTPATIENT_CLINIC_OR_DEPARTMENT_OTHER): Admission: RE | Payer: Self-pay | Source: Home / Self Care

## 2019-02-07 DIAGNOSIS — O99281 Endocrine, nutritional and metabolic diseases complicating pregnancy, first trimester: Secondary | ICD-10-CM | POA: Diagnosis not present

## 2019-02-07 DIAGNOSIS — F319 Bipolar disorder, unspecified: Secondary | ICD-10-CM | POA: Diagnosis not present

## 2019-02-07 DIAGNOSIS — O99011 Anemia complicating pregnancy, first trimester: Secondary | ICD-10-CM | POA: Diagnosis not present

## 2019-02-07 DIAGNOSIS — Z79890 Hormone replacement therapy (postmenopausal): Secondary | ICD-10-CM | POA: Insufficient documentation

## 2019-02-07 DIAGNOSIS — Z888 Allergy status to other drugs, medicaments and biological substances status: Secondary | ICD-10-CM | POA: Diagnosis not present

## 2019-02-07 DIAGNOSIS — Z87891 Personal history of nicotine dependence: Secondary | ICD-10-CM | POA: Diagnosis not present

## 2019-02-07 DIAGNOSIS — O021 Missed abortion: Principal | ICD-10-CM | POA: Diagnosis present

## 2019-02-07 DIAGNOSIS — E039 Hypothyroidism, unspecified: Secondary | ICD-10-CM | POA: Diagnosis not present

## 2019-02-07 HISTORY — PX: DILATION AND EVACUATION: SHX1459

## 2019-02-07 LAB — CBC
HCT: 23 % — ABNORMAL LOW (ref 36.0–46.0)
Hemoglobin: 7.6 g/dL — ABNORMAL LOW (ref 12.0–15.0)
MCH: 31.5 pg (ref 26.0–34.0)
MCHC: 33 g/dL (ref 30.0–36.0)
MCV: 95.4 fL (ref 80.0–100.0)
Platelets: 225 10*3/uL (ref 150–400)
RBC: 2.41 MIL/uL — ABNORMAL LOW (ref 3.87–5.11)
RDW: 12.7 % (ref 11.5–15.5)
WBC: 7.5 10*3/uL (ref 4.0–10.5)
nRBC: 0 % (ref 0.0–0.2)

## 2019-02-07 LAB — POCT HEMOGLOBIN-HEMACUE
Hemoglobin: 8.2 g/dL — ABNORMAL LOW (ref 12.0–15.0)
Hemoglobin: 9.7 g/dL — ABNORMAL LOW (ref 12.0–15.0)

## 2019-02-07 LAB — PREPARE RBC (CROSSMATCH)

## 2019-02-07 SURGERY — DILATION AND EVACUATION, UTERUS
Anesthesia: General | Site: Vagina

## 2019-02-07 SURGERY — DILATION AND EVACUATION, UTERUS
Anesthesia: Choice

## 2019-02-07 MED ORDER — SCOPOLAMINE 1 MG/3DAYS TD PT72
1.0000 | MEDICATED_PATCH | TRANSDERMAL | Status: DC
  Administered 2019-02-07: 1.5 mg via TRANSDERMAL

## 2019-02-07 MED ORDER — PROPOFOL 10 MG/ML IV BOLUS
INTRAVENOUS | Status: DC | PRN
  Administered 2019-02-07: 150 mg via INTRAVENOUS

## 2019-02-07 MED ORDER — LACTATED RINGERS IV SOLN: INTRAVENOUS | Status: DC

## 2019-02-07 MED ORDER — SCOPOLAMINE 1 MG/3DAYS TD PT72
MEDICATED_PATCH | TRANSDERMAL | Status: AC
  Filled 2019-02-07: qty 1

## 2019-02-07 MED ORDER — LIDOCAINE HCL (CARDIAC) PF 100 MG/5ML IV SOSY
PREFILLED_SYRINGE | INTRAVENOUS | Status: DC | PRN
  Administered 2019-02-07: 80 mg via INTRAVENOUS

## 2019-02-07 MED ORDER — FENTANYL CITRATE (PF) 100 MCG/2ML IJ SOLN
25.0000 ug | INTRAMUSCULAR | Status: DC | PRN
  Administered 2019-02-07 (×3): 25 ug via INTRAVENOUS

## 2019-02-07 MED ORDER — FERROUS SULFATE 325 (65 FE) MG PO TABS: 325.0000 mg | ORAL_TABLET | Freq: Two times a day (BID) | ORAL | 1 refills | Status: DC

## 2019-02-07 MED ORDER — ONDANSETRON HCL 4 MG/2ML IJ SOLN: 4.0000 mg | Freq: Four times a day (QID) | INTRAMUSCULAR | Status: DC | PRN

## 2019-02-07 MED ORDER — CHLOROPROCAINE HCL 1 % IJ SOLN
INTRAMUSCULAR | Status: AC
  Filled 2019-02-07: qty 30

## 2019-02-07 MED ORDER — OXYCODONE-ACETAMINOPHEN 5-325 MG PO TABS
1.0000 | ORAL_TABLET | ORAL | Status: DC | PRN
  Administered 2019-02-07: 1 via ORAL
  Filled 2019-02-07: qty 1

## 2019-02-07 MED ORDER — MIDAZOLAM HCL 5 MG/5ML IJ SOLN
INTRAMUSCULAR | Status: DC | PRN
  Administered 2019-02-07: 2 mg via INTRAVENOUS

## 2019-02-07 MED ORDER — ACETAMINOPHEN 325 MG PO TABS: 650.0000 mg | ORAL_TABLET | Freq: Four times a day (QID) | ORAL | Status: DC | PRN

## 2019-02-07 MED ORDER — ONDANSETRON HCL 4 MG/2ML IJ SOLN
INTRAMUSCULAR | Status: DC | PRN
  Administered 2019-02-07: 4 mg via INTRAVENOUS

## 2019-02-07 MED ORDER — FENTANYL CITRATE (PF) 100 MCG/2ML IJ SOLN
INTRAMUSCULAR | Status: AC
  Filled 2019-02-07: qty 2

## 2019-02-07 MED ORDER — DOXYCYCLINE HYCLATE 100 MG IV SOLR
200.0000 mg | INTRAVENOUS | Status: AC
  Administered 2019-02-07: 100 mg via INTRAVENOUS

## 2019-02-07 MED ORDER — METHYLERGONOVINE MALEATE 0.2 MG/ML IJ SOLN
INTRAMUSCULAR | Status: AC
  Filled 2019-02-07: qty 1

## 2019-02-07 MED ORDER — FENTANYL CITRATE (PF) 100 MCG/2ML IJ SOLN
INTRAMUSCULAR | Status: DC | PRN
  Administered 2019-02-07: 100 ug via INTRAVENOUS

## 2019-02-07 MED ORDER — ACETAMINOPHEN 325 MG PO TABS
ORAL_TABLET | ORAL | Status: AC
  Filled 2019-02-07: qty 2

## 2019-02-07 MED ORDER — OXYCODONE-ACETAMINOPHEN 5-325 MG PO TABS: 1.0000 | ORAL_TABLET | Freq: Four times a day (QID) | ORAL | 0 refills | Status: DC | PRN

## 2019-02-07 MED ORDER — PROMETHAZINE HCL 25 MG/ML IJ SOLN: 25.0000 mg | Freq: Four times a day (QID) | INTRAMUSCULAR | Status: DC | PRN

## 2019-02-07 MED ORDER — ONDANSETRON HCL 4 MG PO TABS: 4.0000 mg | ORAL_TABLET | Freq: Four times a day (QID) | ORAL | Status: DC | PRN

## 2019-02-07 MED ORDER — MISOPROSTOL 200 MCG PO TABS
800.0000 ug | ORAL_TABLET | Freq: Once | ORAL | Status: AC
  Administered 2019-02-07: 800 ug via RECTAL

## 2019-02-07 MED ORDER — KETOROLAC TROMETHAMINE 30 MG/ML IJ SOLN
INTRAMUSCULAR | Status: DC | PRN
  Administered 2019-02-07: 30 mg via INTRAVENOUS

## 2019-02-07 MED ORDER — DIPHENHYDRAMINE HCL 25 MG PO CAPS
25.0000 mg | ORAL_CAPSULE | Freq: Once | ORAL | Status: AC
  Administered 2019-02-07: 25 mg via ORAL

## 2019-02-07 MED ORDER — IBUPROFEN 600 MG PO TABS
600.0000 mg | ORAL_TABLET | Freq: Four times a day (QID) | ORAL | Status: DC
  Administered 2019-02-07: 600 mg via ORAL
  Filled 2019-02-07: qty 1

## 2019-02-07 MED ORDER — OXYTOCIN 10 UNIT/ML IJ SOLN
INTRAMUSCULAR | Status: AC
  Filled 2019-02-07: qty 1

## 2019-02-07 MED ORDER — SODIUM CHLORIDE 0.9% IV SOLUTION: Freq: Once | INTRAVENOUS | Status: DC

## 2019-02-07 MED ORDER — IBUPROFEN 600 MG PO TABS: 600.0000 mg | ORAL_TABLET | Freq: Four times a day (QID) | ORAL | 3 refills | Status: DC | PRN

## 2019-02-07 MED ORDER — ROCURONIUM BROMIDE 10 MG/ML (PF) SYRINGE
PREFILLED_SYRINGE | INTRAVENOUS | Status: DC | PRN
  Administered 2019-02-07: 5 mg via INTRAVENOUS

## 2019-02-07 MED ORDER — DOCUSATE SODIUM 100 MG PO CAPS: 100.0000 mg | ORAL_CAPSULE | Freq: Two times a day (BID) | ORAL | 2 refills | Status: DC | PRN

## 2019-02-07 MED ORDER — SODIUM CHLORIDE 0.9 % IV SOLN: INTRAVENOUS | Status: DC

## 2019-02-07 MED ORDER — SUCCINYLCHOLINE CHLORIDE 20 MG/ML IJ SOLN
INTRAMUSCULAR | Status: DC | PRN
  Administered 2019-02-07: 140 mg via INTRAVENOUS

## 2019-02-07 MED ORDER — DEXAMETHASONE SODIUM PHOSPHATE 10 MG/ML IJ SOLN
INTRAMUSCULAR | Status: DC | PRN
  Administered 2019-02-07: 5 mg via INTRAVENOUS

## 2019-02-07 MED ORDER — MISOPROSTOL 200 MCG PO TABS
ORAL_TABLET | ORAL | Status: AC
  Filled 2019-02-07: qty 5

## 2019-02-07 MED ORDER — OXYCODONE-ACETAMINOPHEN 5-325 MG PO TABS
1.0000 | ORAL_TABLET | ORAL | Status: DC | PRN
  Administered 2019-02-07: 2 via ORAL
  Filled 2019-02-07 (×2): qty 1

## 2019-02-07 MED ORDER — ACETAMINOPHEN 325 MG PO TABS
650.0000 mg | ORAL_TABLET | Freq: Once | ORAL | Status: AC
  Administered 2019-02-07: 650 mg via ORAL

## 2019-02-07 MED ORDER — CARBOPROST TROMETHAMINE 250 MCG/ML IM SOLN
INTRAMUSCULAR | Status: AC
  Filled 2019-02-07: qty 1

## 2019-02-07 MED ORDER — DIPHENHYDRAMINE HCL 25 MG PO CAPS
ORAL_CAPSULE | ORAL | Status: AC
  Filled 2019-02-07: qty 1

## 2019-02-07 MED ORDER — ACETAMINOPHEN 500 MG PO TABS
1000.0000 mg | ORAL_TABLET | Freq: Once | ORAL | Status: AC
  Administered 2019-02-07: 1000 mg via ORAL

## 2019-02-07 SURGICAL SUPPLY — 25 items
CATH ROBINSON RED A/P 14FR (CATHETERS) ×3 IMPLANT
DECANTER SPIKE VIAL GLASS SM (MISCELLANEOUS) ×3 IMPLANT
FILTER UTR ASPR ASSEMBLY (MISCELLANEOUS) IMPLANT
GLOVE BIOGEL PI IND STRL 6.5 (GLOVE) ×1 IMPLANT
GLOVE BIOGEL PI IND STRL 7.0 (GLOVE) ×1 IMPLANT
GLOVE BIOGEL PI INDICATOR 6.5 (GLOVE) ×2
GLOVE BIOGEL PI INDICATOR 7.0 (GLOVE) ×2
GLOVE SURG SS PI 6.0 STRL IVOR (GLOVE) ×3 IMPLANT
GOWN STRL REUS W/TWL LRG LVL3 (GOWN DISPOSABLE) ×6 IMPLANT
HIBICLENS CHG 4% 4OZ BTL (MISCELLANEOUS) ×3 IMPLANT
HOSE CONNECTING 18IN BERKELEY (TUBING) IMPLANT
KIT BERKELEY 1ST TRI 3/8 NO TR (MISCELLANEOUS) ×3 IMPLANT
KIT BERKELEY 1ST TRIMESTER 3/8 (MISCELLANEOUS) IMPLANT
NS IRRIG 1000ML POUR BTL (IV SOLUTION) ×3 IMPLANT
PACK VAGINAL MINOR WOMEN LF (CUSTOM PROCEDURE TRAY) ×3 IMPLANT
PAD OB MATERNITY 4.3X12.25 (PERSONAL CARE ITEMS) ×3 IMPLANT
PAD PREP 24X48 CUFFED NSTRL (MISCELLANEOUS) ×3 IMPLANT
SET BERKELEY SUCTION TUBING (SUCTIONS) ×3 IMPLANT
SLEEVE SCD COMPRESS KNEE MED (MISCELLANEOUS) ×3 IMPLANT
TOWEL GREEN STERILE FF (TOWEL DISPOSABLE) ×6 IMPLANT
VACURETTE 10 RIGID CVD (CANNULA) IMPLANT
VACURETTE 6 ASPIR F TIP BERK (CANNULA) IMPLANT
VACURETTE 7MM CVD STRL WRAP (CANNULA) ×2 IMPLANT
VACURETTE 8 RIGID CVD (CANNULA) IMPLANT
VACURETTE 9 RIGID CVD (CANNULA) ×2 IMPLANT

## 2019-02-07 NOTE — Anesthesia Preprocedure Evaluation (Addendum)
Anesthesia Evaluation  Patient identified by MRN, date of birth, ID band Patient awake    Reviewed: Allergy & Precautions, NPO status , Patient's Chart, lab work & pertinent test results  Airway Mallampati: I  TM Distance: >3 FB Neck ROM: Full    Dental no notable dental hx. (+) Teeth Intact, Dental Advisory Given   Pulmonary neg pulmonary ROS, former smoker,    Pulmonary exam normal breath sounds clear to auscultation       Cardiovascular negative cardio ROS Normal cardiovascular exam Rhythm:Regular Rate:Normal     Neuro/Psych  Headaches, PSYCHIATRIC DISORDERS Anxiety Depression Bipolar Disorder    GI/Hepatic Neg liver ROS, GERD  ,  Endo/Other  Hypothyroidism   Renal/GU negative Renal ROS  negative genitourinary   Musculoskeletal negative musculoskeletal ROS (+)   Abdominal   Peds  Hematology negative hematology ROS (+)   Anesthesia Other Findings Missed AB at [redacted] weeks gestation  Reproductive/Obstetrics                           Anesthesia Physical Anesthesia Plan  ASA: II  Anesthesia Plan: General   Post-op Pain Management:    Induction: Intravenous and Rapid sequence  PONV Risk Score and Plan: 3 and Ondansetron, Dexamethasone and Midazolam  Airway Management Planned: Oral ETT  Additional Equipment:   Intra-op Plan:   Post-operative Plan: Extubation in OR  Informed Consent: I have reviewed the patients History and Physical, chart, labs and discussed the procedure including the risks, benefits and alternatives for the proposed anesthesia with the patient or authorized representative who has indicated his/her understanding and acceptance.     Dental advisory given  Plan Discussed with: CRNA  Anesthesia Plan Comments:       Anesthesia Quick Evaluation

## 2019-02-07 NOTE — Op Note (Signed)
Jody Carter PROCEDURE DATE: 02/07/2019  PREOPERATIVE DIAGNOSIS: retained products of conception POSTOPERATIVE DIAGNOSIS: The same. PROCEDURE:     Dilation and Evacuation. SURGEON:  Dr. Catalina Antigua  INDICATIONS: 24 y.o. [id number] with retained products of conception, needing surgical completion.  Risks of surgery were discussed with the patient including but not limited to: bleeding which may require transfusion; infection which may require antibiotics; injury to uterus or surrounding organs;need for additional procedures including laparotomy or laparoscopy; possibility of intrauterine scarring which may impair future fertility; and other postoperative/anesthesia complications. Written informed consent was obtained.    FINDINGS:  A 9-week size anteverted uterus, with tissue/clot visualized at os (measuring 2x 4 cm once removed) moderate amounts of products of conception, specimen sent to pathology.  ANESTHESIA:    Monitored intravenous sedation INTRAVENOUS FLUIDS:  1000 ml of LR ESTIMATED BLOOD LOSS:  Less than 20 ml. SPECIMENS:  Products of conception sent to pathology COMPLICATIONS:  None immediate.  PROCEDURE DETAILS:  The patient received intravenous antibiotics while in the preoperative area.  She was then taken to the operating room where general anesthesia was administered and was found to be adequate.  After an adequate timeout was performed, she was placed in the dorsal lithotomy position and examined; then prepped and draped in the sterile manner.   Her bladder was catheterized for an unmeasured amount of clear, yellow urine. A vaginal speculum was then placed in the patient's vagina and a single tooth tenaculum was applied to the anterior lip of the cervix.  The cervix did not require to be dilated to accommodate a 9 mm suction curette that was gently advanced to the uterine fundus.  The suction device was then activated and curette slowly rotated to clear the uterus of products of  conception.  A sharp curettage was then performed to confirm complete emptying of the uterus. There was minimal bleeding noted and the tenaculum removed with good hemostasis noted.   All instruments were removed from the patient's vagina. The patient tolerated the procedure well and was taken to the recovery area awake, and in stable condition.  The patient will be discharged to home as per PACU criteria.  Routine postoperative instructions given.  She was prescribed Percocet, Ibuprofen and Colace.  She will follow up in the clinic in 2 weeks for postoperative evaluation.

## 2019-02-07 NOTE — Anesthesia Postprocedure Evaluation (Signed)
Anesthesia Post Note  Patient: Jody Carter  Procedure(s) Performed: DILATATION AND EVACUATION (N/A Vagina )     Patient location during evaluation: PACU Anesthesia Type: General Level of consciousness: awake and alert and oriented Pain management: pain level controlled Vital Signs Assessment: post-procedure vital signs reviewed and stable Respiratory status: spontaneous breathing, nonlabored ventilation and respiratory function stable Cardiovascular status: blood pressure returned to baseline and stable Postop Assessment: no apparent nausea or vomiting Anesthetic complications: no    Last Vitals:  Vitals:   02/07/19 1230 02/07/19 1245  BP: 105/61 105/82  Pulse: 85 81  Resp: 20 16  Temp: 36.8 C   SpO2: 98% 99%    Last Pain:  Vitals:   02/07/19 1230  TempSrc:   PainSc: 2                  Shannell Mikkelsen A.

## 2019-02-07 NOTE — Anesthesia Procedure Notes (Signed)
Procedure Name: Intubation Date/Time: 02/07/2019 11:55 AM Performed by: Raenette Rover, CRNA Pre-anesthesia Checklist: Patient identified, Emergency Drugs available, Suction available and Patient being monitored Patient Re-evaluated:Patient Re-evaluated prior to induction Oxygen Delivery Method: Circle system utilized Preoxygenation: Pre-oxygenation with 100% oxygen Induction Type: IV induction and Rapid sequence Laryngoscope Size: Mac and 3 Grade View: Grade I Tube type: Oral Tube size: 7.0 mm Number of attempts: 1 Airway Equipment and Method: Stylet Placement Confirmation: ETT inserted through vocal cords under direct vision,  positive ETCO2 and breath sounds checked- equal and bilateral Secured at: 21 cm Tube secured with: Tape Dental Injury: Teeth and Oropharynx as per pre-operative assessment

## 2019-02-07 NOTE — Transfer of Care (Signed)
Immediate Anesthesia Transfer of Care Note  Patient: Jody Carter  Procedure(s) Performed: DILATATION AND EVACUATION (N/A Vagina )  Patient Location: PACU  Anesthesia Type:General  Level of Consciousness: awake, alert , oriented and patient cooperative  Airway & Oxygen Therapy: Patient Spontanous Breathing  Post-op Assessment: Report given to RN and Post -op Vital signs reviewed and stable  Post vital signs: Reviewed and stable  Last Vitals:  Vitals Value Taken Time  BP 107/68 02/07/19 1219  Temp    Pulse 88 02/07/19 1223  Resp 20 02/07/19 1223  SpO2 100 % 02/07/19 1223  Vitals shown include unvalidated device data.  Last Pain:  Vitals:   02/07/19 0918  TempSrc: Tympanic  PainSc: 7          Complications: No apparent anesthesia complications

## 2019-02-08 ENCOUNTER — Encounter: Payer: Self-pay | Admitting: *Deleted

## 2019-02-08 DIAGNOSIS — O021 Missed abortion: Secondary | ICD-10-CM | POA: Diagnosis not present

## 2019-02-08 LAB — SURGICAL PATHOLOGY

## 2019-02-08 LAB — BPAM RBC
Blood Product Expiration Date: 202101312359
Blood Product Expiration Date: 202101312359
ISSUE DATE / TIME: 202101131617
ISSUE DATE / TIME: 202101131617
Unit Type and Rh: 6200
Unit Type and Rh: 6200

## 2019-02-08 LAB — TYPE AND SCREEN
ABO/RH(D): A POS
Antibody Screen: NEGATIVE
Unit division: 0
Unit division: 0

## 2019-02-08 NOTE — Discharge Summary (Signed)
OB Discharge Summary     Patient Name: Jody Carter DOB: 03-15-1995 MRN: [id number]  Date of admission: 02/07/2019 Delivering MD: This patient has no babies on file.  Date of discharge: 02/08/2019  Admitting diagnosis: Missed abortion [O02.1] Intrauterine pregnancy: [redacted]w[redacted]d     Secondary diagnosis:  Active Problems:   Missed abortion  Additional problems: symptomatic anemia     Discharge diagnosis: s/p D&E with symptomatic anemia                                                                                                Hospital course:  Patient diagnosed with retained products of conception s/p medical management of missed abortion. Patient underwent an uncomplicated dilatation and evacuation. She was found to have excessive vaginal bleeding in the recovery room accompanied by hypotension. Her starting hemoglobin was 8.6 and dropped to 7.4 in the recovery room. Patient reported feeling dizzy. Decision was made to transfusion 2 units pRBC and observe overnight. Patient received 800 mcg cytotec which helped decrease her vaginal bleeding significantly. Her post transfusion CBC rose to 9.7. Patient was found stable for discharge home. Discharge instructions were reviewed with the patient  Physical exam  Vitals:   02/08/19 0515 02/08/19 0545 02/08/19 0615 02/08/19 0700  BP:    (!) 96/55  Pulse: 62   66  Resp:    16  Temp:    97.9 F (36.6 C)  TempSrc:      SpO2: 99% 99% 99% 100%  Weight:      Height:       GENERAL: Well-developed, well-nourished female in no acute distress.  LUNGS: Clear to auscultation bilaterally.  HEART: Regular rate and rhythm. ABDOMEN: Soft, nontender, nondistended. No organomegaly. PELVIC: Scant vaginal bleeding EXTREMITIES: No cyanosis, clubbing, or edema, 2+ distal pulses.  Labs: Lab Results  Component Value Date   WBC 7.5 02/07/2019   HGB 9.7 (L) 02/07/2019   HCT 23.0 (L) 02/07/2019   MCV 95.4 02/07/2019   PLT 225 02/07/2019   CMP Latest  Ref Rng & Units 12/14/2017  Glucose 70 - 99 mg/dL 88  BUN 6 - 20 mg/dL 16  Creatinine 0.44 - 1.00 mg/dL 0.58  Sodium 135 - 145 mmol/L 140  Potassium 3.5 - 5.1 mmol/L 3.5  Chloride 98 - 111 mmol/L 107  CO2 22 - 32 mmol/L 27  Calcium 8.9 - 10.3 mg/dL 8.7(L)  Total Protein 6.5 - 8.1 g/dL 6.6  Total Bilirubin 0.3 - 1.2 mg/dL 0.4  Alkaline Phos 38 - 126 U/L 45  AST 15 - 41 U/L 15  ALT 0 - 44 U/L 14    Discharge instruction: per After Visit Summary   After visit meds:  Allergies as of 02/08/2019      Reactions   Sumatriptan Other (See Comments)   Neck and chest pressure, parasthesias      Medication List    STOP taking these medications   oxyCODONE 5 MG immediate release tablet Commonly known as: Roxicodone     TAKE these medications   docusate sodium 100 MG capsule Commonly known as: COLACE Take 1  capsule (100 mg total) by mouth 2 (two) times daily as needed.   ferrous sulfate 325 (65 FE) MG tablet Commonly known as: FerrouSul Take 1 tablet (325 mg total) by mouth 2 (two) times daily.   ibuprofen 600 MG tablet Commonly known as: ADVIL Take 1 tablet (600 mg total) by mouth every 6 (six) hours as needed. What changed:   medication strength  how much to take  when to take this   levothyroxine 88 MCG tablet Commonly known as: SYNTHROID Take 1 tablet (88 mcg total) by mouth daily before breakfast. For hypothyroidism   oxyCODONE-acetaminophen 5-325 MG tablet Commonly known as: PERCOCET/ROXICET Take 1 tablet by mouth every 6 (six) hours as needed.       Diet: routine diet  Activity: Advance as tolerated. Pelvic rest for 6 weeks.   Outpatient follow up:2 weeks Follow up Appt: Future Appointments  Date Time Provider Department Center  02/20/2019  9:45 AM Tinita Brooker, Gigi Gin, MD [facility] None     02/08/2019 Catalina Antigua, MD

## 2019-02-08 NOTE — Discharge Instructions (Signed)
    Dilation and Curettage or Vacuum Curettage, Care After These instructions give you information about caring for yourself after your procedure. Your doctor may also give you more specific instructions. Call your doctor if you have any problems or questions after your procedure. Follow these instructions at home: Activity  Do not drive or use heavy machinery while taking prescription pain medicine.  For 24 hours after your procedure, avoid driving.  Take short walks often, followed by rest periods. Ask your doctor what activities are safe for you. After one or two days, you may be able to return to your normal activities.  Do not lift anything that is heavier than 10 lb (4.5 kg) until your doctor approves.  For at least 2 weeks, or as long as told by your doctor: ? Do not douche. ? Do not use tampons. ? Do not have sex. General instructions   Take over-the-counter and prescription medicines only as told by your doctor. This is very important if you take blood thinning medicine.  Do not take baths, swim, or use a hot tub until your doctor approves. Take showers instead of baths.  Wear compression stockings as told by your doctor.  It is up to you to get the results of your procedure. Ask your doctor when your results will be ready.  Keep all follow-up visits as told by your doctor. This is important. Contact a doctor if:  You have very bad cramps that get worse or do not get better with medicine.  You have very bad pain in your belly (abdomen).  You cannot drink fluids without throwing up (vomiting).  You get pain in a different part of the area between your belly and thighs (pelvis).  You have bad-smelling discharge from your vagina.  You have a rash. Get help right away if:  You are bleeding a lot from your vagina. A lot of bleeding means soaking more than one sanitary pad in an hour, for 2 hours in a row.  You have clumps of blood (blood clots) coming from your  vagina.  You have a fever or chills.  Your belly feels very tender or hard.  You have chest pain.  You have trouble breathing.  You cough up blood.  You feel dizzy.  You feel light-headed.  You pass out (faint).  You have pain in your neck or shoulder area. Summary  Take short walks often, followed by rest periods. Ask your doctor what activities are safe for you. After one or two days, you may be able to return to your normal activities.  Do not lift anything that is heavier than 10 lb (4.5 kg) until your doctor approves.  Do not take baths, swim, or use a hot tub until your doctor approves. Take showers instead of baths.  Contact your doctor if you have any symptoms of infection, like bad-smelling discharge from your vagina. This information is not intended to replace advice given to you by your health care provider. Make sure you discuss any questions you have with your health care provider.

## 2019-02-12 ENCOUNTER — Encounter (HOSPITAL_COMMUNITY): Payer: Self-pay

## 2019-02-12 ENCOUNTER — Other Ambulatory Visit: Payer: Self-pay

## 2019-02-12 ENCOUNTER — Emergency Department (HOSPITAL_COMMUNITY)
Admission: EM | Admit: 2019-02-12 | Discharge: 2019-02-13 | Disposition: A | Discharge status: Home / Self Care | Payer: Medicaid Other | Attending: Emergency Medicine | Admitting: Emergency Medicine

## 2019-02-12 DIAGNOSIS — Z79899 Other long term (current) drug therapy: Secondary | ICD-10-CM | POA: Diagnosis not present

## 2019-02-12 DIAGNOSIS — I951 Orthostatic hypotension: Secondary | ICD-10-CM | POA: Insufficient documentation

## 2019-02-12 DIAGNOSIS — Z87891 Personal history of nicotine dependence: Secondary | ICD-10-CM | POA: Insufficient documentation

## 2019-02-12 DIAGNOSIS — Z9889 Other specified postprocedural states: Secondary | ICD-10-CM | POA: Insufficient documentation

## 2019-02-12 DIAGNOSIS — R42 Dizziness and giddiness: Secondary | ICD-10-CM | POA: Diagnosis present

## 2019-02-12 DIAGNOSIS — R0789 Other chest pain: Secondary | ICD-10-CM | POA: Insufficient documentation

## 2019-02-12 MED ORDER — SODIUM CHLORIDE 0.9 % IV BOLUS
1000.0000 mL | Freq: Once | INTRAVENOUS | Status: AC
  Administered 2019-02-13: 1000 mL via INTRAVENOUS

## 2019-02-12 NOTE — ED Provider Notes (Signed)
[facility] EMERGENCY DEPARTMENT Provider Note   CSN: [id number] Arrival date & time: 02/12/19  2345     History Chief Complaint  Patient presents with  . Chest Pain    Jody Carter is a 24 y.o. female.  The history is provided by the patient and medical records. No language interpreter was used.  Chest Pain    24 year old female, G5, P1 presenting complaining of feeling lightheadedness and dizziness.  Patient reports she was [redacted] weeks pregnant who had a miscarriage approximately a week ago follows with a D&C 5 days ago.  States that she lost a significant on the blood requiring blood transfusion.  She is recovering from that.  She endorsed some mild vaginal spotting however for the past few days she endorsed increased generalized weakness, lightheadedness dizziness specifically with positional changes.  She endorsed nauseous, vomiting once a and having some mild chest discomfort comfort only when she vomits.  Her primary concern is her lightheadedness and dizziness.  She denies tobacco or alcohol use.  She does report family history of cardiac disease.  She does not complain of exertional chest pain or having shortness of breath.  She endorsed mild abdominal cramping only.  Past Medical History:  Diagnosis Date  . Anxiety   . Depression   . Hx of migraines   . Panic disorder   . Thyroid disease     Patient Active Problem List   Diagnosis Date Noted  . Missed abortion 02/07/2019  . Encounter to determine fetal viability of pregnancy 05/17/2018  . Less than [redacted] weeks gestation of pregnancy 05/17/2018  . Positive pregnancy test 05/17/2018  . Bipolar disorder, unspecified (HCC) 04/11/2017  . Active labor 02/18/2014  . Susceptible to varicella (non-immune), currently pregnant 07/03/2013  . Supervision of normal first teen pregnancy 06/15/2013  . Hx of migraines 07/31/2012    Past Surgical History:  Procedure Laterality Date  . ADENOIDECTOMY    . BREAST SURGERY     augmentation  . CHOLECYSTECTOMY    . DILATION AND EVACUATION N/A 02/07/2019   Procedure: DILATATION AND EVACUATION;  Surgeon: Catalina Antigua, MD;  Location: [facility];  Service: Gynecology;  Laterality: N/A;  . EYE SURGERY Bilateral    closed tear ducts opened as child  . TONSILLECTOMY       OB History    Gravida  4   Para  1   Term  1   Preterm      AB  2   Living  1     SAB  1   TAB  1   Ectopic      Multiple  1   Live Births  1           Family History  Problem Relation Age of Onset  . Heart attack Father   . Diabetes Paternal Grandfather   . Hypertension Maternal Grandmother   . Heart attack Maternal Grandmother   . Hypertension Maternal Grandfather   . Heart attack Maternal Grandfather     Social History   Tobacco Use  . Smoking status: Former Smoker    Types: E-cigarettes  . Smokeless tobacco: Never Used  Substance Use Topics  . Alcohol use: No  . Drug use: No    Home Medications Prior to Admission medications   Medication Sig Start Date End Date Taking? Authorizing Provider  docusate sodium (COLACE) 100 MG capsule Take 1 capsule (100 mg total) by mouth 2 (two) times daily as needed. 02/07/19  Constant, Peggy, MD  ferrous sulfate (FERROUSUL) 325 (65 FE) MG tablet Take 1 tablet (325 mg total) by mouth 2 (two) times daily. 02/07/19   Constant, Peggy, MD  ibuprofen (ADVIL) 600 MG tablet Take 1 tablet (600 mg total) by mouth every 6 (six) hours as needed. 02/07/19   Constant, Peggy, MD  levothyroxine (SYNTHROID, LEVOTHROID) 88 MCG tablet Take 1 tablet (88 mcg total) by mouth daily before breakfast. For hypothyroidism 04/14/17   Money, Gerlene Burdock, FNP  oxyCODONE-acetaminophen (PERCOCET/ROXICET) 5-325 MG tablet Take 1 tablet by mouth every 6 (six) hours as needed. 02/07/19   Constant, Peggy, MD    Allergies    Sumatriptan  Review of Systems   Review of Systems  Cardiovascular: Positive for chest pain.  All other systems reviewed and  are negative.   Physical Exam Updated Vital Signs Ht 5\' 3"  (1.6 m)   Wt 61.8 kg   LMP 11/12/2018   Breastfeeding Unknown   BMI 24.13 kg/m   Physical Exam Vitals and nursing note reviewed.  Constitutional:      General: She is not in acute distress.    Appearance: She is well-developed.  HENT:     Head: Atraumatic.  Eyes:     Conjunctiva/sclera: Conjunctivae normal.  Cardiovascular:     Rate and Rhythm: Normal rate and regular rhythm.     Heart sounds: Normal heart sounds.  Pulmonary:     Effort: Pulmonary effort is normal.     Breath sounds: Normal breath sounds.  Chest:     Chest wall: No tenderness.  Abdominal:     Palpations: Abdomen is soft.     Tenderness: There is abdominal tenderness (Very mild suprapubic tenderness no guarding or rebound tenderness.).  Musculoskeletal:     Cervical back: Neck supple.  Skin:    Coloration: Skin is pale.     Findings: No rash.  Neurological:     Mental Status: She is alert.     GCS: GCS eye subscore is 4. GCS verbal subscore is 5. GCS motor subscore is 6.  Psychiatric:        Speech: Speech normal.        Behavior: Behavior is cooperative.     ED Results / Procedures / Treatments   Labs (all labs ordered are listed, but only abnormal results are displayed) Labs Reviewed  BASIC METABOLIC PANEL - Abnormal; Notable for the following components:      Result Value   Glucose, Bld 108 (*)    Calcium 8.5 (*)    All other components within normal limits  CBC WITH DIFFERENTIAL/PLATELET  TYPE AND SCREEN    EKG None  ED ECG REPORT   Date: 02/13/2019  Rate: 76  Rhythm: normal sinus rhythm  QRS Axis: normal  Intervals: borderline short PR interval  ST/T Wave abnormalities: normal  Conduction Disutrbances:nonspecific intraventricular conduction delay  Narrative Interpretation:   Old EKG Reviewed: none available  I have personally reviewed the EKG tracing and agree with the computerized printout as  noted.   Radiology No results found.  Procedures Procedures (including critical care time)  Medications Ordered in ED Medications  sodium chloride 0.9 % bolus 1,000 mL (1,000 mLs Intravenous New Bag/Given (Non-Interop) 02/13/19 0030)    ED Course  I have reviewed the triage vital signs and the nursing notes.  Pertinent labs & imaging results that were available during my care of the patient were reviewed by me and considered in my medical decision making (see chart for details).  MDM Rules/Calculators/A&P                      BP (!) 106/58   Pulse 73   Temp 97.8 F (36.6 C) (Oral)   Resp 13   Ht 5\' 3"  (1.6 m)   Wt 61.8 kg   LMP 11/12/2018   SpO2 99%   Breastfeeding Unknown   BMI 24.13 kg/m   Final Clinical Impression(s) / ED Diagnoses Final diagnoses:  Orthostatic hypotension    Rx / DC Orders ED Discharge Orders    None     11:58 PM Patient report recently miscarriage with twins at [redacted]w[redacted]d who apparently had quite a bit of blood loss from her miscarriage requiring blood transfusion 5 days ago here with generalized weakness lightheadedness and dizziness likely from blood loss.  Initial triage note document the patient has chest pain however her primary complaint is lightheadedness.  She does appear to be pale.  Will check CBC, will obtain type and screen, give IV fluid and will perform orthostatic vital sign.  No COVID-19 symptoms. Report minimal vaginal spotting only. No significant abd pain.   From previous note, patient was diagnosed with retained products of conception status post medical management of missed abortion.  She underwent an uncomplicated dilatation evacuation and found to have excessive vaginal bleeding in the recovery room.  Her starting hemoglobin was 8.6 and dropped to 7.4 in the recovery room requiring 2 units of packed red blood cells and observation overnight.  She also received Cytotec.  Her posttransfusion CBC improved to 9.7.  She was  discharged on 02/08/2019.  12:34 AM Fortunately hemoglobin is 12.1 and back to normal baseline.  Patient is positive for orthostasis.  She will receive IV fluid here with anticipation of going home when she is symptomatically improved.   Domenic Moras, PA-C 02/13/19 0102    Merryl Hacker, MD 02/13/19 [id number]

## 2019-02-12 NOTE — ED Triage Notes (Signed)
Pt received blood trans earlier in the month due to having a miscarriage. Called ems for CP. But said that she is weak and has headache.

## 2019-02-12 NOTE — ED Notes (Signed)
PA in room

## 2019-02-13 ENCOUNTER — Other Ambulatory Visit: Payer: Self-pay

## 2019-02-13 LAB — CBC WITH DIFFERENTIAL/PLATELET
Abs Immature Granulocytes: 0.02 10*3/uL (ref 0.00–0.07)
Basophils Absolute: 0 10*3/uL (ref 0.0–0.1)
Basophils Relative: 1 %
Eosinophils Absolute: 0.2 10*3/uL (ref 0.0–0.5)
Eosinophils Relative: 3 %
HCT: 37.5 % (ref 36.0–46.0)
Hemoglobin: 12.1 g/dL (ref 12.0–15.0)
Immature Granulocytes: 0 %
Lymphocytes Relative: 41 %
Lymphs Abs: 2.7 10*3/uL (ref 0.7–4.0)
MCH: 30.6 pg (ref 26.0–34.0)
MCHC: 32.3 g/dL (ref 30.0–36.0)
MCV: 94.7 fL (ref 80.0–100.0)
Monocytes Absolute: 0.4 10*3/uL (ref 0.1–1.0)
Monocytes Relative: 6 %
Neutro Abs: 3.3 10*3/uL (ref 1.7–7.7)
Neutrophils Relative %: 49 %
Platelets: 360 10*3/uL (ref 150–400)
RBC: 3.96 MIL/uL (ref 3.87–5.11)
RDW: 13.4 % (ref 11.5–15.5)
WBC: 6.6 10*3/uL (ref 4.0–10.5)
nRBC: 0 % (ref 0.0–0.2)

## 2019-02-13 LAB — BASIC METABOLIC PANEL
Anion gap: 5 (ref 5–15)
BUN: 18 mg/dL (ref 6–20)
CO2: 23 mmol/L (ref 22–32)
Calcium: 8.5 mg/dL — ABNORMAL LOW (ref 8.9–10.3)
Chloride: 110 mmol/L (ref 98–111)
Creatinine, Ser: 0.54 mg/dL (ref 0.44–1.00)
GFR calc Af Amer: >60 mL/min (ref >60)
GFR calc non Af Amer: >60 mL/min (ref >60)
Glucose, Bld: 108 mg/dL — ABNORMAL HIGH (ref 70–99)
Potassium: 3.9 mmol/L (ref 3.5–5.1)
Sodium: 138 mmol/L (ref 135–145)

## 2019-02-13 LAB — TYPE AND SCREEN
ABO/RH(D): A POS
Antibody Screen: NEGATIVE

## 2019-02-13 NOTE — Discharge Instructions (Addendum)
Your weakness and lightheadedness is likely due to dehydration.  Stay hydrated.  Get adequate rest.  Follow up closely with your doctor.  Your hemoglobin today is normal at 12.1.

## 2019-02-20 ENCOUNTER — Telehealth: Payer: Medicaid Other | Admitting: Obstetrics and Gynecology

## 2019-02-20 NOTE — Progress Notes (Signed)
Patient rescheduled appointment.

## 2019-02-27 ENCOUNTER — Ambulatory Visit: Payer: Medicaid Other | Admitting: Advanced Practice Midwife

## 2019-02-28 ENCOUNTER — Ambulatory Visit: Payer: Medicaid Other | Admitting: Family Medicine

## 2019-03-02 ENCOUNTER — Other Ambulatory Visit: Payer: Self-pay | Admitting: Obstetrics and Gynecology

## 2019-03-05 ENCOUNTER — Ambulatory Visit (INDEPENDENT_AMBULATORY_CARE_PROVIDER_SITE_OTHER): Payer: Medicaid Other | Admitting: Obstetrics and Gynecology

## 2019-03-05 ENCOUNTER — Encounter: Payer: Self-pay | Admitting: Obstetrics and Gynecology

## 2019-03-05 ENCOUNTER — Telehealth: Payer: Self-pay | Admitting: *Deleted

## 2019-03-05 ENCOUNTER — Ambulatory Visit: Payer: Medicaid Other | Admitting: Advanced Practice Midwife

## 2019-03-05 DIAGNOSIS — Z9889 Other specified postprocedural states: Secondary | ICD-10-CM

## 2019-03-05 NOTE — Telephone Encounter (Signed)
Left message @ 11:12 am. JSY °

## 2019-03-05 NOTE — Telephone Encounter (Signed)
Pt was pregnant with twins and started bleeding and passed them on 02/01/19. Had a D&C on 02/07/19. Pt has had unprotected sex several times. Pt noticed some spotting yesterday. Not heavy. After D&C, pt was told first period would probably be heavy. Pt concerned that her bleeding is not heavy and wonders if she could be pregnant. I advised pt to do a pregnancy test and let us know what results are. Pt voiced understanding. JSY

## 2019-03-05 NOTE — Progress Notes (Signed)
TELEHEALTH GYNECOLOGY VIRTUAL VIDEO VISIT ENCOUNTER NOTE  Provider location: [facility] [place]   I connected with Jody Carter on 03/05/19 at 11:00 AM EST by MyChart Video Encounter at home and verified that I am speaking with the correct person using two identifiers.   I discussed the limitations, risks, security and privacy concerns of performing an evaluation and management service virtually and the availability of in person appointments. I also discussed with the patient that there may be a patient responsible charge related to this service. The patient expressed understanding and agreed to proceed.   History:  Jody Carter is a 24 y.o. [phone] female being evaluated today for post op check s/p D&E on 1/13 for incomplete miscarriage complicated by the need for a blood transfusion due to anemia. Patient reports doing well and is without complaints. She has had unprotected intercourse and would welcome a pregnancy. She denies any abnormal vaginal discharge, bleeding, pelvic pain or other concerns.       Past Medical History:  Diagnosis Date  . Anxiety   . Depression   . Hx of migraines   . Panic disorder   . Thyroid disease    Past Surgical History:  Procedure Laterality Date  . ADENOIDECTOMY    . BREAST SURGERY     augmentation  . CHOLECYSTECTOMY    . DILATION AND EVACUATION N/A 02/07/2019   Procedure: DILATATION AND EVACUATION;  Surgeon: Catalina Antigua, MD;  Location: [facility];  Service: Gynecology;  Laterality: N/A;  . EYE SURGERY Bilateral    closed tear ducts opened as child  . TONSILLECTOMY     The following portions of the patient's history were reviewed and updated as appropriate: allergies, current medications, past family history, past medical history, past social history, past surgical history and problem list.    Review of Systems:  Pertinent items noted in HPI and remainder of comprehensive ROS otherwise  negative.  Physical Exam:   General:  Alert, oriented and cooperative. Patient appears to be in no acute distress.  Mental Status: Normal mood and affect. Normal behavior. Normal judgment and thought content.   Respiratory: Normal respiratory effort, no problems with respiration noted  Rest of physical exam deferred due to type of encounter  Labs and Imaging No results found for this or any previous visit (from the past 336 hour(s)). US OB Transvaginal  Result Date: 02/03/2019 CLINICAL DATA:  Retained products of conception. EXAM: OBSTETRIC <14 WK [place] AND TRANSVAGINAL OB US TECHNIQUE: Both transabdominal and transvaginal ultrasound examinations were performed for complete evaluation of the gestation as well as the maternal uterus, adnexal regions, and pelvic cul-de-sac. Transvaginal technique was performed to assess early pregnancy. COMPARISON:  None. FINDINGS: Intrauterine gestational sac: None Yolk sac:  Not Visualized. Embryo:  Not Visualized. Cardiac Activity: Not Visualized. Maternal uterus/adnexae: The right ovary measures 3.5 x 1.6 x 1.2 cm. The left ovary measures 3.7 x 2.5 x 2.1 cm. The endometrial canal is thickened and heterogeneous with increased vascularity. The endometrial thickness measures currently 2.5 cm. IMPRESSION: 1. No IUP identified. 2. Thickened heterogeneous endometrial stripe with increased vascularity raises concern for retained products of conception in the appropriate clinical setting. Electronically Signed   By: Katherine Mantle M.D.   On: 02/03/2019 15:02       Assessment and Plan:     24 yo P1021 s/p D& E for retained POC on 02/07/19 - Patient is medically cleared to resume all activities of  daily living - Advised patient to continue prenatal vitamins - RTC prn for annual exam and/or prenatal care.      I discussed the assessment and treatment plan with the patient. The patient was provided an opportunity to ask questions and all were answered. The patient  agreed with the plan and demonstrated an understanding of the instructions.   The patient was advised to call back or seek an in-person evaluation/go to the ED if the symptoms worsen or if the condition fails to improve as anticipated.  I provided 11 minutes of face-to-face time during this encounter.   Mora Bellman, MD [facility] [place]

## 2019-03-05 NOTE — Telephone Encounter (Signed)
Pt returning a call to the nurse

## 2019-03-05 NOTE — Telephone Encounter (Signed)
Pt left message that she needs a nurse to call her back today.

## 2019-03-06 ENCOUNTER — Telehealth: Payer: Self-pay | Admitting: Obstetrics & Gynecology

## 2019-03-06 NOTE — Telephone Encounter (Signed)
Pt done a pregnancy test yesterday that was faintly positive. Pt done another one today and it was positive also. Pt had a D&C on 02/01/19. Spoke with Dr. Emelda Fear. He feels like this is a new pregnancy and to come into office in about a week and treat as a new pregnancy. Pt voiced understanding. Call transferred to front desk for appt for a pregnancy test.  JSY

## 2019-03-06 NOTE — Telephone Encounter (Signed)
Pt requesting a call from you. 

## 2019-03-13 ENCOUNTER — Other Ambulatory Visit: Payer: Self-pay

## 2019-03-13 ENCOUNTER — Ambulatory Visit (INDEPENDENT_AMBULATORY_CARE_PROVIDER_SITE_OTHER): Payer: Medicaid Other

## 2019-03-13 VITALS — BP 116/78 | HR 96 | Ht 63.0 in | Wt 133.0 lb

## 2019-03-13 DIAGNOSIS — Z3202 Encounter for pregnancy test, result negative: Secondary | ICD-10-CM

## 2019-03-13 DIAGNOSIS — N926 Irregular menstruation, unspecified: Secondary | ICD-10-CM

## 2019-03-13 LAB — POCT URINE PREGNANCY: Preg Test, Ur: NEGATIVE

## 2019-03-13 NOTE — Progress Notes (Addendum)
   NURSE VISIT- PREGNANCY CONFIRMATION   SUBJECTIVE:  Jody Carter is a 24 y.o. [phone] female at of  Here for pregnancy confirmation.  Home pregnancy test 3 postive and 1 negative.  She had light period on 03-06-19. Pregnancy test here negative. Spoke jennifer griffin about result. Will get beta hcg quantative    OBJECTIVE:  BP 116/78 (BP Location: Right Arm, Patient Position: Sitting, Cuff Size: Normal)   Pulse 96   Ht 5\' 3"  (1.6 m)   Wt 133 lb (60.3 kg)   LMP 03/06/2019   BMI 23.56 kg/m   Appears well, in no apparent distress OB History  Gravida Para Term Preterm AB Living  4 1 1   2 1   SAB TAB Ectopic Multiple Live Births  1 1   1 1     # Outcome Date GA Lbr Len/2nd Weight Sex Delivery Anes PTL Lv  4A Gravida           4B Gravida           3 Term 02/19/14 [redacted]w[redacted]d 13:55 / 00:13 8 lb 3.8 oz (3.737 kg) F Vag-Spont EPI  LIV  2 TAB           1 SAB               ASSESSMENT:     PLAN   beta hcg quantative Will route note jennifer griffin a Panda Crossin  03/13/2019 10:23 AM

## 2019-03-13 NOTE — Addendum Note (Signed)
Addended by: Federico Flake A on: 03/13/2019 10:39 AM   Modules accepted: Orders

## 2019-03-13 NOTE — Progress Notes (Signed)
Chart reviewed for nurse visit. Agree with plan of care.  Adline Potter, NP 03/13/2019 1:21 PM

## 2019-03-14 LAB — BETA HCG QUANT (REF LAB): hCG Quant: 1 m[IU]/mL

## 2019-03-27 ENCOUNTER — Ambulatory Visit (INDEPENDENT_AMBULATORY_CARE_PROVIDER_SITE_OTHER): Payer: Medicaid Other | Admitting: Advanced Practice Midwife

## 2019-03-27 ENCOUNTER — Other Ambulatory Visit: Payer: Self-pay

## 2019-03-27 ENCOUNTER — Encounter: Payer: Self-pay | Admitting: Advanced Practice Midwife

## 2019-03-27 VITALS — BP 120/74 | HR 86 | Ht 63.0 in | Wt 133.0 lb

## 2019-03-27 DIAGNOSIS — N911 Secondary amenorrhea: Secondary | ICD-10-CM

## 2019-03-27 NOTE — Progress Notes (Signed)
  GYNECOLOGY PROGRESS NOTE  History:  24 y.o. [id number] presents to [facility] office today for problem gyn visit. She reports light bleeding, unlike her usual menses in early February, followed by positive pregnancy test 03/06/19. She reports pregnancy symptoms such as breast tenderness and nausea c/w her previous pregnancies.  She has had 3-4 other positive pregnancy tests, some faintly positive but all positive since 2/9.  She had D&C for missed ab/retained products of conception on 02/07/19. She denies any bleeding except the light bleeding in early February.  Of note, she had quant hcg at [facility] on 03/13/19 with result of 1.    Given negative UPT here in the office, will obtain hcg today.    She denies h/a, dizziness, shortness of breath, n/v, or fever/chills.    The following portions of the patient's history were reviewed and updated as appropriate: allergies, current medications, past family history, past medical history, past social history, past surgical history and problem list.  Review of Systems:  Pertinent items are noted in HPI.   Objective:  Physical Exam Blood pressure 120/74, pulse 86, height 5\' 3"  (1.6 m), weight 133 lb (60.3 kg), last menstrual period 03/06/2019, unknown if currently breastfeeding. VS reviewed, nursing note reviewed,  Constitutional: well developed, well nourished, no distress HEENT: normocephalic CV: normal rate Pulm/chest wall: normal effort Breast Exam: deferred Abdomen: soft Neuro: alert and oriented x 3 Skin: warm, dry Psych: affect normal Pelvic exam: Cervix pink, visually closed, without lesion, scant white creamy discharge, vaginal walls and external genitalia normal Bimanual exam: Cervix 0/long/high, firm, anterior, neg CMT, uterus nontender, nonenlarged, adnexa without tenderness, enlargement, or mass  Assessment & Plan:  1. Secondary amenorrhea --Pt is s/p D&C for missed ab/retained POCs on 02/07/19, then had positive pregnancy test with  light bleeding 03/06/19.  She had quant hcg of 1 on 03/13/19 but continues to have pregnancy symptoms and no menses. - Beta hCG quant (ref lab)   03/15/19, CNM 3:34 PM

## 2019-03-27 NOTE — Patient Instructions (Signed)
Abdominal Pain During Pregnancy  Abdominal pain is common during pregnancy, and has many possible causes. Some causes are more serious than others, and sometimes the cause is not known. Abdominal pain can be a sign that labor is starting. It can also be caused by normal growth and stretching of muscles and ligaments during pregnancy. Always tell your health care provider if you have any abdominal pain. Follow these instructions at home:  Do not have sex or put anything in your vagina until your pain goes away completely.  Get plenty of rest until your pain improves.  Drink enough fluid to keep your urine pale yellow.  Take over-the-counter and prescription medicines only as told by your health care provider.  Keep all follow-up visits as told by your health care provider. This is important. Contact a health care provider if:  Your pain continues or gets worse after resting.  You have lower abdominal pain that: ? Comes and goes at regular intervals. ? Spreads to your back. ? Is similar to menstrual cramps.  You have pain or burning when you urinate. Get help right away if:  You have a fever or chills.  You have vaginal bleeding.  You are leaking fluid from your vagina.  You are passing tissue from your vagina.  You have vomiting or diarrhea that lasts for more than 24 hours.  Your baby is moving less than usual.  You feel very weak or faint.  You have shortness of breath.  You develop severe pain in your upper abdomen. Summary  Abdominal pain is common during pregnancy, and has many possible causes.  If you experience abdominal pain during pregnancy, tell your health care provider right away.  Follow your health care provider's home care instructions and keep all follow-up visits as directed. This information is not intended to replace advice given to you by your health care provider. Make sure you discuss any questions you have with your health care  provider. Document Revised: 05/01/2018 Document Reviewed: 04/15/2016 Elsevier Patient Education  2020 [employer]vier Inc.  

## 2019-03-28 LAB — BETA HCG QUANT (REF LAB): hCG Quant: <1 m[IU]/mL

## 2019-03-29 ENCOUNTER — Other Ambulatory Visit: Payer: Medicaid Other

## 2019-03-29 ENCOUNTER — Telehealth: Payer: Self-pay

## 2019-03-29 NOTE — Telephone Encounter (Signed)
Return call to pt regarding message pt not ava left message for pt to return call.

## 2019-03-30 ENCOUNTER — Telehealth: Payer: Self-pay

## 2019-03-30 NOTE — Telephone Encounter (Signed)
Return call to pt  Pt wanted to know HCG result I printed result and had provider in the office look at neg HCG and was told ok to let pt know result.  Pt notified and voiced understanding.

## 2019-04-11 ENCOUNTER — Encounter (HOSPITAL_COMMUNITY): Payer: Self-pay | Admitting: Emergency Medicine

## 2019-04-11 ENCOUNTER — Emergency Department (HOSPITAL_COMMUNITY)
Admission: EM | Admit: 2019-04-11 | Discharge: 2019-04-11 | Disposition: A | Discharge status: Home / Self Care | Payer: Medicaid Other | Attending: Emergency Medicine | Admitting: Emergency Medicine

## 2019-04-11 ENCOUNTER — Other Ambulatory Visit: Payer: Self-pay

## 2019-04-11 DIAGNOSIS — Z87891 Personal history of nicotine dependence: Secondary | ICD-10-CM | POA: Insufficient documentation

## 2019-04-11 DIAGNOSIS — R55 Syncope and collapse: Secondary | ICD-10-CM | POA: Insufficient documentation

## 2019-04-11 DIAGNOSIS — R4182 Altered mental status, unspecified: Secondary | ICD-10-CM | POA: Insufficient documentation

## 2019-04-11 DIAGNOSIS — Z79899 Other long term (current) drug therapy: Secondary | ICD-10-CM | POA: Diagnosis not present

## 2019-04-11 LAB — BASIC METABOLIC PANEL
Anion gap: 11 (ref 5–15)
BUN: 17 mg/dL (ref 6–20)
CO2: 22 mmol/L (ref 22–32)
Calcium: 9.3 mg/dL (ref 8.9–10.3)
Chloride: 105 mmol/L (ref 98–111)
Creatinine, Ser: 0.74 mg/dL (ref 0.44–1.00)
GFR calc Af Amer: >60 mL/min (ref >60)
GFR calc non Af Amer: >60 mL/min (ref >60)
Glucose, Bld: 153 mg/dL — ABNORMAL HIGH (ref 70–99)
Potassium: 4.6 mmol/L (ref 3.5–5.1)
Sodium: 138 mmol/L (ref 135–145)

## 2019-04-11 LAB — CBG MONITORING, ED: Glucose-Capillary: 75 mg/dL (ref 70–99)

## 2019-04-11 LAB — URINALYSIS, ROUTINE W REFLEX MICROSCOPIC
Bilirubin Urine: NEGATIVE
Glucose, UA: NEGATIVE mg/dL
Hgb urine dipstick: NEGATIVE
Ketones, ur: NEGATIVE mg/dL
Leukocytes,Ua: NEGATIVE
Nitrite: NEGATIVE
Protein, ur: NEGATIVE mg/dL
Specific Gravity, Urine: 1.031 — ABNORMAL HIGH (ref 1.005–1.030)
pH: 5 (ref 5.0–8.0)

## 2019-04-11 LAB — CBC
HCT: 41.1 % (ref 36.0–46.0)
Hemoglobin: 13.1 g/dL (ref 12.0–15.0)
MCH: 29.6 pg (ref 26.0–34.0)
MCHC: 31.9 g/dL (ref 30.0–36.0)
MCV: 93 fL (ref 80.0–100.0)
Platelets: 347 10*3/uL (ref 150–400)
RBC: 4.42 MIL/uL (ref 3.87–5.11)
RDW: 12.7 % (ref 11.5–15.5)
WBC: 6.4 10*3/uL (ref 4.0–10.5)
nRBC: 0 % (ref 0.0–0.2)

## 2019-04-11 LAB — I-STAT BETA HCG BLOOD, ED (MC, WL, AP ONLY): I-stat hCG, quantitative: <5 m[IU]/mL (ref <5)

## 2019-04-11 MED ORDER — SODIUM CHLORIDE 0.9 % IV BOLUS
1000.0000 mL | Freq: Once | INTRAVENOUS | Status: AC
  Administered 2019-04-11: 1000 mL via INTRAVENOUS

## 2019-04-11 MED ORDER — SODIUM CHLORIDE 0.9% FLUSH
3.0000 mL | Freq: Once | INTRAVENOUS | Status: AC
  Administered 2019-04-11: 3 mL via INTRAVENOUS

## 2019-04-11 NOTE — Discharge Instructions (Addendum)
Please return for any problem.  Follow-up with your regular care provider as instructed. °

## 2019-04-11 NOTE — ED Notes (Signed)
PT ambulated without difficulty or dizziness.

## 2019-04-11 NOTE — ED Provider Notes (Signed)
[facility] EMERGENCY DEPARTMENT Provider Note   CSN: [id number] Arrival date & time: 04/11/19  5643     History Chief Complaint  Patient presents with  . Altered Mental Status    Jody Carter is a 24 y.o. female.  24 year old female with prior medical history as detailed below presents for evaluation following reported syncopal event. 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 She reports that today was her first day at work here at [employer]. While she was being oriented, she began to feel lightheaded and then dizzy. Shortly thereafter she passed out. She denies any current complaint. She denies associated chest pain or palpitations. She denies prior history of syncope. She does report that she has had poor sleep in the last 2 days secondary to anxiety about her new job. She reports that she had a minimal breakfast this morning prior to coming to work.  The history is provided by the patient and medical records.  Loss of Consciousness Episode history:  Single Most recent episode:  Today Timing:  Rare Progression:  Resolved Chronicity:  New Witnessed: yes   Relieved by:  Nothing Worsened by:  Nothing Associated symptoms: no chest pain and no vomiting        Past Medical History:  Diagnosis Date  . Anxiety   . Depression   . Hx of migraines   . Panic disorder   . Thyroid disease     Patient Active Problem List   Diagnosis Date Noted  . Missed abortion 02/07/2019  . Encounter to determine fetal viability of pregnancy 05/17/2018  . Less than [redacted] weeks gestation of pregnancy 05/17/2018  . Positive pregnancy test 05/17/2018  . Bipolar disorder, unspecified ([place]) 04/11/2017  . Active labor 02/18/2014  . Susceptible to varicella (non-immune), currently pregnant 07/03/2013  . Supervision of normal first teen pregnancy 06/15/2013  . Hx of migraines 07/31/2012    Past Surgical History:  Procedure Laterality Date  . ADENOIDECTOMY    . BREAST SURGERY       augmentation  . CHOLECYSTECTOMY    . DILATION AND EVACUATION N/A 02/07/2019   Procedure: DILATATION AND EVACUATION;  Surgeon: Mora Bellman, MD;  Location: [facility];  Service: Gynecology;  Laterality: N/A;  . EYE SURGERY Bilateral    closed tear ducts opened as child  . TONSILLECTOMY       OB History    Gravida  4   Para  1   Term  1   Preterm      AB  2   Living  1     SAB  1   TAB  1   Ectopic      Multiple  1   Live Births  1           Family History  Problem Relation Age of Onset  . Heart attack Father   . Diabetes Paternal Grandfather   . Hypertension Maternal Grandmother   . Heart attack Maternal Grandmother   . Hypertension Maternal Grandfather   . Heart attack Maternal Grandfather     Social History   Tobacco Use  . Smoking status: Former Smoker    Types: E-cigarettes  . Smokeless tobacco: Never Used  Substance Use Topics  . Alcohol use: No  . Drug use: No    Home Medications Prior to Admission medications   Medication Sig Start Date End Date Taking? Authorizing Provider  levothyroxine (SYNTHROID, LEVOTHROID) 88 MCG tablet Take 1 tablet (88 mcg total)  by mouth daily before breakfast. For hypothyroidism 04/14/17   Money, Gerlene Burdock, FNP    Allergies    Sumatriptan  Review of Systems   Review of Systems  Cardiovascular: Positive for syncope. Negative for chest pain.  Gastrointestinal: Negative for vomiting.  All other systems reviewed and are negative.   Physical Exam Updated Vital Signs BP 109/66 (BP Location: Right Arm)   Pulse 76   Temp 97.7 F (36.5 C) (Oral)   Resp 16   Ht 5\' 3"  (1.6 m)   Wt 59 kg   LMP 11/12/2018   SpO2 100%   BMI 23.03 kg/m   Physical Exam Vitals and nursing note reviewed.  Constitutional:      General: She is not in acute distress.    Appearance: Normal appearance. She is well-developed.  HENT:     Head: Normocephalic and atraumatic.  Eyes:     Conjunctiva/sclera:  Conjunctivae normal.     Pupils: Pupils are equal, round, and reactive to light.  Cardiovascular:     Rate and Rhythm: Normal rate and regular rhythm.     Heart sounds: Normal heart sounds.  Pulmonary:     Effort: Pulmonary effort is normal. No respiratory distress.     Breath sounds: Normal breath sounds.  Abdominal:     General: There is no distension.     Palpations: Abdomen is soft.     Tenderness: There is no abdominal tenderness.  Musculoskeletal:        General: No deformity. Normal range of motion.     Cervical back: Normal range of motion and neck supple.  Skin:    General: Skin is warm and dry.  Neurological:     General: No focal deficit present.     Mental Status: She is alert and oriented to person, place, and time. Mental status is at baseline.     ED Results / Procedures / Treatments   Labs (all labs ordered are listed, but only abnormal results are displayed) Labs Reviewed  BASIC METABOLIC PANEL - Abnormal; Notable for the following components:      Result Value   Glucose, Bld 153 (*)    All other components within normal limits  URINALYSIS, ROUTINE W REFLEX MICROSCOPIC - Abnormal; Notable for the following components:   APPearance HAZY (*)    Specific Gravity, Urine 1.031 (*)    All other components within normal limits  CBC  CBG MONITORING, ED  I-STAT BETA HCG BLOOD, ED ([facility], [facility], [facility] ONLY)    EKG None  Radiology No results found.  Procedures Procedures (including critical care time)  Medications Ordered in ED Medications  sodium chloride flush (NS) 0.9 % injection 3 mL (3 mLs Intravenous Given 04/11/19 0842)  sodium chloride 0.9 % bolus 1,000 mL (1,000 mLs Intravenous New Bag/Given 04/11/19 04/13/19)    ED Course  I have reviewed the triage vital signs and the nursing notes.  Pertinent labs & imaging results that were available during my care of the patient were reviewed by me and considered in my medical decision making (see chart for details).     MDM Rules/Calculators/A&P                      MDM  Screen complete  Cartina JANETT KAMATH was evaluated in Emergency Department on 04/11/2019 for the symptoms described in the history of present illness. She was evaluated in the context of the global COVID-19 pandemic, which necessitated consideration that the patient might  be at risk for infection with the SARS-CoV-2 virus that causes COVID-19. Institutional protocols and algorithms that pertain to the evaluation of patients at risk for COVID-19 are in a state of rapid change based on information released by regulatory bodies including the CDC and federal and state organizations. These policies and algorithms were followed during the patient's care in the ED.  Patient is presenting for evaluation of syncope. Described syncope is consistent with likely vasovagal syncope.   Screening labs do not reveal any significant pathology.   Patient feels improved following her ED evaluation.  She now desires discharge home.  Importance of close follow-up is stressed.   Final Clinical Impression(s) / ED Diagnoses Final diagnoses:  Vasovagal syncope    Rx / DC Orders ED Discharge Orders    None       Wynetta Fines, MD 04/11/19 1034

## 2019-04-11 NOTE — ED Triage Notes (Signed)
Pt was working on the floor and began to feel "hot and dizzy."  She was also diaphoretic at the time.  She remembers her vision going and then waking up on the floor w/ several people around her. States she has some abdominal pain earlier in the night.Pt is alert and oriented at this time.    118/90 96 HR CBG 102

## 2019-04-27 DIAGNOSIS — J029 Acute pharyngitis, unspecified: Secondary | ICD-10-CM | POA: Diagnosis not present

## 2019-04-27 DIAGNOSIS — R52 Pain, unspecified: Secondary | ICD-10-CM | POA: Diagnosis not present

## 2019-04-27 DIAGNOSIS — R509 Fever, unspecified: Secondary | ICD-10-CM | POA: Diagnosis not present

## 2019-04-27 DIAGNOSIS — J0100 Acute maxillary sinusitis, unspecified: Secondary | ICD-10-CM | POA: Diagnosis not present

## 2019-04-27 DIAGNOSIS — R6883 Chills (without fever): Secondary | ICD-10-CM | POA: Diagnosis not present

## 2019-05-29 DIAGNOSIS — E039 Hypothyroidism, unspecified: Secondary | ICD-10-CM | POA: Diagnosis not present

## 2019-05-29 DIAGNOSIS — Z0000 Encounter for general adult medical examination without abnormal findings: Secondary | ICD-10-CM | POA: Diagnosis not present

## 2019-07-02 ENCOUNTER — Other Ambulatory Visit: Payer: Medicaid Other

## 2019-07-02 ENCOUNTER — Telehealth: Payer: Self-pay | Admitting: Women's Health

## 2019-07-02 ENCOUNTER — Telehealth: Payer: Self-pay | Admitting: *Deleted

## 2019-07-02 ENCOUNTER — Ambulatory Visit: Payer: Medicaid Other | Admitting: Women's Health

## 2019-07-02 ENCOUNTER — Other Ambulatory Visit: Payer: Self-pay

## 2019-07-02 DIAGNOSIS — Z3490 Encounter for supervision of normal pregnancy, unspecified, unspecified trimester: Secondary | ICD-10-CM | POA: Diagnosis not present

## 2019-07-02 NOTE — Telephone Encounter (Signed)
Discussed with KRB and will have patient come for labs today.

## 2019-07-02 NOTE — Telephone Encounter (Signed)
Patient called stating that she has taken some pregnancy test and they have come up with a faint positive but some have turned out negative. Pt would like to know if kim could order a stat pregnancy test before her appointment on wednesday before she discusses Paragaurd. Please contact pt

## 2019-07-03 LAB — BETA HCG QUANT (REF LAB): hCG Quant: <1 m[IU]/mL

## 2019-07-04 ENCOUNTER — Ambulatory Visit: Payer: Medicaid Other | Admitting: Women's Health

## 2019-07-24 DIAGNOSIS — H5213 Myopia, bilateral: Secondary | ICD-10-CM | POA: Diagnosis not present

## 2019-09-12 ENCOUNTER — Other Ambulatory Visit: Payer: Self-pay

## 2019-09-12 ENCOUNTER — Emergency Department (HOSPITAL_BASED_OUTPATIENT_CLINIC_OR_DEPARTMENT_OTHER)
Admission: EM | Admit: 2019-09-12 | Discharge: 2019-09-12 | Disposition: A | Discharge status: Home / Self Care | Payer: Medicaid Other | Attending: Emergency Medicine | Admitting: Emergency Medicine

## 2019-09-12 ENCOUNTER — Encounter (HOSPITAL_BASED_OUTPATIENT_CLINIC_OR_DEPARTMENT_OTHER): Payer: Self-pay | Admitting: Emergency Medicine

## 2019-09-12 DIAGNOSIS — R4781 Slurred speech: Secondary | ICD-10-CM | POA: Insufficient documentation

## 2019-09-12 DIAGNOSIS — R200 Anesthesia of skin: Secondary | ICD-10-CM | POA: Insufficient documentation

## 2019-09-12 DIAGNOSIS — Z5321 Procedure and treatment not carried out due to patient leaving prior to being seen by health care provider: Secondary | ICD-10-CM | POA: Diagnosis not present

## 2019-09-12 LAB — URINALYSIS, ROUTINE W REFLEX MICROSCOPIC
Bilirubin Urine: NEGATIVE
Glucose, UA: NEGATIVE mg/dL
Hgb urine dipstick: NEGATIVE
Ketones, ur: NEGATIVE mg/dL
Leukocytes,Ua: NEGATIVE
Nitrite: NEGATIVE
Protein, ur: NEGATIVE mg/dL
Specific Gravity, Urine: 1.02 (ref 1.005–1.030)
pH: 6 (ref 5.0–8.0)

## 2019-09-12 LAB — PREGNANCY, URINE: Preg Test, Ur: NEGATIVE

## 2019-09-12 NOTE — ED Notes (Signed)
Pt called for vitals recheck in lobby no answer 

## 2019-09-12 NOTE — ED Triage Notes (Signed)
PT states "feeling Off" feet and arms feel tingly. Pt states for 3 days she has had slurred speech and mental fogginess. PT with clear speech, vss. Skin warm and dry. Vss.

## 2019-09-12 NOTE — ED Notes (Signed)
Pt called for vitals recheck in the lobby no answer

## 2019-10-28 DIAGNOSIS — R569 Unspecified convulsions: Secondary | ICD-10-CM | POA: Insufficient documentation

## 2019-12-25 ENCOUNTER — Encounter (HOSPITAL_COMMUNITY): Payer: Self-pay | Admitting: Emergency Medicine

## 2019-12-25 ENCOUNTER — Other Ambulatory Visit: Payer: Self-pay

## 2019-12-25 DIAGNOSIS — Z5321 Procedure and treatment not carried out due to patient leaving prior to being seen by health care provider: Secondary | ICD-10-CM | POA: Insufficient documentation

## 2019-12-25 DIAGNOSIS — M542 Cervicalgia: Secondary | ICD-10-CM | POA: Diagnosis present

## 2019-12-25 DIAGNOSIS — R1110 Vomiting, unspecified: Secondary | ICD-10-CM | POA: Insufficient documentation

## 2019-12-25 NOTE — ED Triage Notes (Signed)
Pt c/o neck pain with muscle spasms that shoot down her back.

## 2019-12-25 NOTE — ED Triage Notes (Signed)
Pt called for triage. Pt stormed out and left the ED because she stated this nurse had an attitude. This nurse said just a minute in reference to pushing her to triage because I had to find someone to help.

## 2019-12-26 ENCOUNTER — Emergency Department (HOSPITAL_COMMUNITY)
Admission: EM | Admit: 2019-12-26 | Discharge: 2019-12-26 | Disposition: A | Discharge status: Home / Self Care | Payer: Medicaid Other | Attending: Emergency Medicine | Admitting: Emergency Medicine

## 2020-01-26 DIAGNOSIS — M797 Fibromyalgia: Secondary | ICD-10-CM

## 2020-01-26 HISTORY — DX: Fibromyalgia: M79.7

## 2020-07-15 ENCOUNTER — Telehealth: Payer: Self-pay | Admitting: Women's Health

## 2020-07-15 NOTE — Telephone Encounter (Signed)
Pt states she took a home pregnancy test, positive line was faint Pretty intense cramping Missed her period & currently not having any signs that her period is starting  Worries she could be having a miscarriage  Should she be seen or could we order an HCG?  Please advise & notify pt

## 2021-04-16 ENCOUNTER — Inpatient Hospital Stay (HOSPITAL_COMMUNITY)
Admission: AD | Admit: 2021-04-16 | Discharge: 2021-04-16 | Disposition: A | Discharge status: Home / Self Care | Payer: Medicaid Other | Attending: Obstetrics & Gynecology | Admitting: Obstetrics & Gynecology

## 2021-04-16 ENCOUNTER — Inpatient Hospital Stay (HOSPITAL_COMMUNITY): Payer: Medicaid Other

## 2021-04-16 ENCOUNTER — Other Ambulatory Visit: Payer: Self-pay

## 2021-04-16 ENCOUNTER — Encounter (HOSPITAL_COMMUNITY): Payer: Self-pay | Admitting: Obstetrics & Gynecology

## 2021-04-16 DIAGNOSIS — Z79899 Other long term (current) drug therapy: Secondary | ICD-10-CM | POA: Diagnosis not present

## 2021-04-16 DIAGNOSIS — E079 Disorder of thyroid, unspecified: Secondary | ICD-10-CM | POA: Diagnosis not present

## 2021-04-16 DIAGNOSIS — O26853 Spotting complicating pregnancy, third trimester: Secondary | ICD-10-CM | POA: Diagnosis not present

## 2021-04-16 DIAGNOSIS — G40909 Epilepsy, unspecified, not intractable, without status epilepticus: Secondary | ICD-10-CM | POA: Insufficient documentation

## 2021-04-16 DIAGNOSIS — O99283 Endocrine, nutritional and metabolic diseases complicating pregnancy, third trimester: Secondary | ICD-10-CM | POA: Insufficient documentation

## 2021-04-16 DIAGNOSIS — O99353 Diseases of the nervous system complicating pregnancy, third trimester: Secondary | ICD-10-CM | POA: Insufficient documentation

## 2021-04-16 DIAGNOSIS — O3483 Maternal care for other abnormalities of pelvic organs, third trimester: Secondary | ICD-10-CM | POA: Diagnosis not present

## 2021-04-16 DIAGNOSIS — R1030 Lower abdominal pain, unspecified: Secondary | ICD-10-CM | POA: Diagnosis not present

## 2021-04-16 DIAGNOSIS — N8311 Corpus luteum cyst of right ovary: Secondary | ICD-10-CM | POA: Insufficient documentation

## 2021-04-16 DIAGNOSIS — O26893 Other specified pregnancy related conditions, third trimester: Secondary | ICD-10-CM | POA: Insufficient documentation

## 2021-04-16 DIAGNOSIS — Z6790 Unspecified blood type, Rh positive: Secondary | ICD-10-CM | POA: Diagnosis not present

## 2021-04-16 DIAGNOSIS — M797 Fibromyalgia: Secondary | ICD-10-CM | POA: Insufficient documentation

## 2021-04-16 DIAGNOSIS — Z3A29 29 weeks gestation of pregnancy: Secondary | ICD-10-CM | POA: Insufficient documentation

## 2021-04-16 DIAGNOSIS — O3680X0 Pregnancy with inconclusive fetal viability, not applicable or unspecified: Secondary | ICD-10-CM | POA: Insufficient documentation

## 2021-04-16 HISTORY — DX: Irritable bowel syndrome, unspecified: K58.9

## 2021-04-16 LAB — CBC
HCT: 36.6 % (ref 36.0–46.0)
Hemoglobin: 12.3 g/dL (ref 12.0–15.0)
MCH: 30.4 pg (ref 26.0–34.0)
MCHC: 33.6 g/dL (ref 30.0–36.0)
MCV: 90.6 fL (ref 80.0–100.0)
Platelets: 354 10*3/uL (ref 150–400)
RBC: 4.04 MIL/uL (ref 3.87–5.11)
RDW: 12.9 % (ref 11.5–15.5)
WBC: 10.6 10*3/uL — ABNORMAL HIGH (ref 4.0–10.5)
nRBC: 0 % (ref 0.0–0.2)

## 2021-04-16 LAB — WET PREP, GENITAL
Sperm: NONE SEEN
Trich, Wet Prep: NONE SEEN
WBC, Wet Prep HPF POC: >=10 — AB (ref <10)
Yeast Wet Prep HPF POC: NONE SEEN

## 2021-04-16 LAB — URINALYSIS, ROUTINE W REFLEX MICROSCOPIC
Bilirubin Urine: NEGATIVE
Glucose, UA: NEGATIVE mg/dL
Hgb urine dipstick: NEGATIVE
Ketones, ur: NEGATIVE mg/dL
Leukocytes,Ua: NEGATIVE
Nitrite: NEGATIVE
Protein, ur: NEGATIVE mg/dL
Specific Gravity, Urine: 1.011 (ref 1.005–1.030)
pH: 5 (ref 5.0–8.0)

## 2021-04-16 LAB — POCT PREGNANCY, URINE: Preg Test, Ur: POSITIVE — AB

## 2021-04-16 LAB — HCG, QUANTITATIVE, PREGNANCY: hCG, Beta Chain, Quant, S: 81111 m[IU]/mL — ABNORMAL HIGH (ref <5)

## 2021-04-16 NOTE — MAU Provider Note (Signed)
?History  ?  ? ?CSN: [id number] ? ?Arrival date and time: 04/16/21 1549 ? ? Event Date/Time  ? First Provider Initiated Contact with Patient 04/16/21 5378   ?  ?26 year old G5 P1-0-3-1 at 29 weeks 3 days by sure LMP presenting with lower abdominal cramping and spotting.  Reports onset of cramping 4 days ago.  Pain is intermittent.  Has not treated.  Spotting occurred once 2 weeks ago after intercourse and again yesterday not associated with intercourse.  Denies urinary symptoms.  History of fibromyalgia, seizure disorder, thyroid disease, anxiety/depression.  Was recently started on Topamax which she discontinued with positive HPT.  States she did not believe she could become pregnant due to partner's history of vasectomy. ? ?OB History   ? ? Gravida  ?5  ? Para  ?1  ? Term  ?1  ? Preterm  ?   ? AB  ?3  ? Living  ?1  ?  ? ? SAB  ?2  ? IAB  ?1  ? Ectopic  ?   ? Multiple  ?1  ? Live Births  ?1  ?   ?  ?  ? ? ?Past Medical History:  ?Diagnosis Date  ? Anxiety   ? Depression   ? Fibromyalgia 2022  ? Hx of migraines   ? IBS (irritable bowel syndrome)   ? Panic disorder   ? Thyroid disease   ? ? ?Past Surgical History:  ?Procedure Laterality Date  ? ADENOIDECTOMY    ? BREAST SURGERY    ? augmentation  ? CHOLECYSTECTOMY    ? DILATION AND EVACUATION N/A 02/07/2019  ? Procedure: DILATATION AND EVACUATION;  Surgeon: Catalina Antigua, MD;  Location: [facility];  Service: Gynecology;  Laterality: N/A;  ? EYE SURGERY Bilateral   ? closed tear ducts opened as child  ? TONSILLECTOMY    ? ? ?Family History  ?Problem Relation Age of Onset  ? Heart attack Father   ? Diabetes Paternal Grandfather   ? Hypertension Maternal Grandmother   ? Heart attack Maternal Grandmother   ? Hypertension Maternal Grandfather   ? Heart attack Maternal Grandfather   ? ? ?Social History  ? ?Tobacco Use  ? Smoking status: Former  ?  Types: E-cigarettes  ? Smokeless tobacco: Never  ?Vaping Use  ? Vaping Use: Every day  ? Substances: Nicotine,  CBD  ?Substance Use Topics  ? Alcohol use: No  ? Drug use: No  ? ? ?Allergies:  ?Allergies  ?Allergen Reactions  ? Sumatriptan Other (See Comments)  ?  Neck and chest pressure, parasthesias  ? ? ?Medications Prior to Admission  ?Medication Sig Dispense Refill Last Dose  ? levETIRAcetam (KEPPRA) 100 MG/ML solution Take by mouth 2 (two) times daily.     ? linaclotide (LINZESS) 290 MCG CAPS capsule Take 290 mcg by mouth daily before breakfast.     ? topiramate (TOPAMAX) 50 MG tablet Take 50 mg by mouth 2 (two) times daily.   Past Month  ? levothyroxine (SYNTHROID, LEVOTHROID) 88 MCG tablet Take 1 tablet (88 mcg total) by mouth daily before breakfast. For hypothyroidism 30 tablet 0   ? ? ?Review of Systems  ?Constitutional:  Negative for chills and fever.  ?Gastrointestinal:  Positive for abdominal pain. Negative for constipation, diarrhea, nausea and vomiting.  ?Genitourinary:  Positive for vaginal bleeding. Negative for dysuria, hematuria, urgency and vaginal discharge.  ?Physical Exam  ? ?Blood pressure 124/65, pulse 86, temperature 98.3 ?F (36.8 ?C), resp. rate 18, height  5\' 3"  (1.6 m), weight 61.2 kg, last menstrual period 02/09/2021, unknown if currently breastfeeding. ? ?Physical Exam ?Vitals and nursing note reviewed.  ?Constitutional:   ?   General: She is not in acute distress. ?   Appearance: Normal appearance.  ?HENT:  ?   Head: Normocephalic and atraumatic.  ?Cardiovascular:  ?   Rate and Rhythm: Normal rate.  ?Pulmonary:  ?   Effort: Pulmonary effort is normal. No respiratory distress.  ?Abdominal:  ?   General: There is no distension.  ?   Palpations: There is no mass.  ?   Tenderness: There is no abdominal tenderness. There is no guarding or rebound.  ?   Hernia: No hernia is present.  ?Musculoskeletal:     ?   General: Normal range of motion.  ?   Cervical back: Normal range of motion.  ?Skin: ?   General: Skin is warm and dry.  ?Neurological:  ?   General: No focal deficit present.  ?   Mental Status:  She is alert and oriented to person, place, and time.  ?Psychiatric:     ?   Mood and Affect: Mood normal.     ?   Behavior: Behavior normal.  ? ?Results for orders placed or performed during the hospital encounter of 04/16/21 (from the past 24 hour(s))  ?Urinalysis, Routine w reflex microscopic Urine, Clean Catch     Status: None  ? Collection Time: 04/16/21  4:17 PM  ?Result Value Ref Range  ? Color, Urine YELLOW YELLOW  ? APPearance CLEAR CLEAR  ? Specific Gravity, Urine 1.011 1.005 - 1.030  ? pH 5.0 5.0 - 8.0  ? Glucose, UA NEGATIVE NEGATIVE mg/dL  ? Hgb urine dipstick NEGATIVE NEGATIVE  ? Bilirubin Urine NEGATIVE NEGATIVE  ? Ketones, ur NEGATIVE NEGATIVE mg/dL  ? Protein, ur NEGATIVE NEGATIVE mg/dL  ? Nitrite NEGATIVE NEGATIVE  ? Leukocytes,Ua NEGATIVE NEGATIVE  ?Pregnancy, urine POC     Status: Abnormal  ? Collection Time: 04/16/21  4:19 PM  ?Result Value Ref Range  ? Preg Test, Ur POSITIVE (A) NEGATIVE  ?CBC     Status: Abnormal  ? Collection Time: 04/16/21  4:54 PM  ?Result Value Ref Range  ? WBC 10.6 (H) 4.0 - 10.5 K/uL  ? RBC 4.04 3.87 - 5.11 MIL/uL  ? Hemoglobin 12.3 12.0 - 15.0 g/dL  ? HCT 36.6 36.0 - 46.0 %  ? MCV 90.6 80.0 - 100.0 fL  ? MCH 30.4 26.0 - 34.0 pg  ? MCHC 33.6 30.0 - 36.0 g/dL  ? RDW 12.9 11.5 - 15.5 %  ? Platelets 354 150 - 400 K/uL  ? nRBC 0.0 0.0 - 0.2 %  ?hCG, quantitative, pregnancy     Status: Abnormal  ? Collection Time: 04/16/21  4:54 PM  ?Result Value Ref Range  ? hCG, Beta Chain, Quant, S 81,111 (H) <5 mIU/mL  ?Wet prep, genital     Status: Abnormal  ? Collection Time: 04/16/21  5:25 PM  ? Specimen: PATH Cytology Cervicovaginal Ancillary Only  ?Result Value Ref Range  ? Yeast Wet Prep HPF POC NONE SEEN NONE SEEN  ? Trich, Wet Prep NONE SEEN NONE SEEN  ? Clue Cells Wet Prep HPF POC PRESENT (A) NONE SEEN  ? WBC, Wet Prep HPF POC >=10 (A) <10  ? Sperm NONE SEEN   ? ?US OB LESS THAN 14 WEEKS WITH OB TRANSVAGINAL ? ?Result Date: 04/16/2021 ?CLINICAL DATA:  Abdominal cramping. Beta  quantitative beta HCG of  81,111. Last menstrual 02/09/21. Gestational age by last menstrual period 9 weeks and 3 days. Estimated due date by last menstrual 11/16/2021. EXAM: OBSTETRIC <14 WK ULTRASOUND TECHNIQUE: Transabdominal ultrasound was performed for evaluation of the gestation as well as the maternal uterus and adnexal regions. COMPARISON:  None. FINDINGS: Intrauterine gestational sac: Single Yolk sac:  Visualized. Embryo:  Not Visualized. Cardiac Activity: Not Visualized. MSD: 19.5  mm   6 w   6  d Subchorionic hemorrhage:  None visualized. Maternal uterus/adnexae: Bilateral ovaries unremarkable. There is a corpus luteum cyst within the right ovary. Other: No free fluid. IMPRESSION: Intrauterine gestational sac and yolk sac with mean sac diameter of 19.5 mm. No embryo identified. Estimated due date by ultrasound of 6 weeks and 6 days non-concordant with gestational age by last menstrual period 9 weeks and 3 days. Findings are suspicious but not yet definitive for failed pregnancy. Recommend follow-up US in 10-14 days for definitive diagnosis. This recommendation follows SRU consensus guidelines: Diagnostic Criteria for Nonviable Pregnancy Early in the First Trimester. Malva Limes Med 2013; 202:5427-06. Electronically Signed   By: Tish Frederickson M.D.   On: 04/16/2021 19:35   ? ?MAU Course  ?Procedures ? ?MDM ?Labs and [place] ordered and reviewed. Suspect failed pregnancy d/t no embryo seen. Recommend rpt [place] in 10 days. Discussed findings with pt. Stable for discharge. ? ?Assessment and Plan  ? ?1. Pregnancy with inconclusive fetal viability, single or unspecified fetus   ?2. Blood type, Rh positive   ? ?Discharge home ?Follow up at [facility] in 10 days for [place] ?SAB precautions ? ?Allergies as of 04/16/2021   ? ?   Reactions  ? Sumatriptan Other (See Comments)  ? Neck and chest pressure, parasthesias  ? ?  ? ?  ?Medication List  ?  ? ?STOP taking these medications   ? ?Linzess 290 MCG Caps capsule ?Generic drug:  linaclotide ?  ?topiramate 50 MG tablet ?Commonly known as: TOPAMAX ?  ? ?  ? ?TAKE these medications   ? ?levETIRAcetam 100 MG/ML solution ?Commonly known as: KEPPRA ?Take by mouth 2 (two) times daily. ?  ?levothyro

## 2021-04-16 NOTE — MAU Note (Signed)
.  Jody Carter is a 26 y.o. at Unknown here in MAU reporting: She stare having some vag bleeding on Thursday and some cramping during intercourse. She took a pregnancy test yesterday and it was positive. Pt still c/o lower abd cramping and some spotting. Worried because she just started talking Topamax for her seizures.  ?LMP: 02/09/2021 ?Onset of complaint:Last Thursday ?Pain score: 6/10 ?Vitals:  ? 04/16/21 1641  ?BP: 124/65  ?Pulse: 86  ?Resp: 18  ?Temp: 98.3 ?F (36.8 ?C)  ?   ?FHT:n/a ?Lab orders placed from triage:   u/a , UPT ?

## 2021-04-16 NOTE — Progress Notes (Signed)
Written and verbal d/c instructions given and understanding voiced. 

## 2021-04-17 LAB — GC/CHLAMYDIA PROBE AMP (~~LOC~~) NOT AT ARMC
Chlamydia: NEGATIVE
Comment: NEGATIVE
Comment: NORMAL
Neisseria Gonorrhea: NEGATIVE

## 2021-04-20 ENCOUNTER — Telehealth: Payer: Self-pay | Admitting: Obstetrics & Gynecology

## 2021-04-20 MED ORDER — PROMETHAZINE HCL 25 MG PO TABS: 25.0000 mg | ORAL_TABLET | Freq: Four times a day (QID) | ORAL | 1 refills | Status: DC | PRN

## 2021-04-20 NOTE — Telephone Encounter (Signed)
Rx sent in for phenergan  

## 2021-04-20 NOTE — Telephone Encounter (Signed)
Patient states she is very nauseated and is wanting something sent in to her pharmacy.  Nausea is getting worse despite trying different things.  Encouraged to sip fluids and not worry about eating to help avoid dehydration. Advised to check back with pharmacy later this afternoon if she did not hear from our office.  ?

## 2021-04-20 NOTE — Telephone Encounter (Signed)
Patient is very nauseas and wants to know if somebody can call her in something for it. Please advise.  ?

## 2021-04-20 NOTE — Addendum Note (Signed)
Addended by: Cyril Mourning A on: 04/20/2021 05:00 PM ? ? Modules accepted: Orders ? ?

## 2021-05-06 ENCOUNTER — Other Ambulatory Visit: Payer: Self-pay | Admitting: Obstetrics & Gynecology

## 2021-05-06 ENCOUNTER — Ambulatory Visit (INDEPENDENT_AMBULATORY_CARE_PROVIDER_SITE_OTHER): Payer: Medicaid Other

## 2021-05-06 DIAGNOSIS — Z3A08 8 weeks gestation of pregnancy: Secondary | ICD-10-CM | POA: Diagnosis not present

## 2021-05-06 DIAGNOSIS — O3680X0 Pregnancy with inconclusive fetal viability, not applicable or unspecified: Secondary | ICD-10-CM

## 2021-05-06 NOTE — Progress Notes (Signed)
[place] 8+6 wks,single IUP with yolk sac,CRL 21.64 mm,normal ovaries,FHR 173 bpm ?

## 2021-05-13 ENCOUNTER — Encounter: Payer: Medicaid Other | Admitting: Advanced Practice Midwife

## 2021-05-13 ENCOUNTER — Ambulatory Visit: Payer: Medicaid Other | Admitting: *Deleted

## 2021-05-13 ENCOUNTER — Other Ambulatory Visit: Payer: Medicaid Other

## 2021-05-27 ENCOUNTER — Encounter: Payer: Self-pay | Admitting: Obstetrics & Gynecology

## 2021-05-27 DIAGNOSIS — Z3490 Encounter for supervision of normal pregnancy, unspecified, unspecified trimester: Secondary | ICD-10-CM | POA: Insufficient documentation

## 2021-05-27 HISTORY — DX: Encounter for supervision of normal pregnancy, unspecified, unspecified trimester: Z34.90

## 2021-05-28 ENCOUNTER — Other Ambulatory Visit: Payer: Self-pay | Admitting: Obstetrics & Gynecology

## 2021-05-28 DIAGNOSIS — Z3682 Encounter for antenatal screening for nuchal translucency: Secondary | ICD-10-CM

## 2021-05-29 ENCOUNTER — Encounter: Payer: Medicaid Other | Admitting: Obstetrics & Gynecology

## 2021-05-29 ENCOUNTER — Ambulatory Visit: Payer: Medicaid Other | Admitting: *Deleted

## 2021-05-29 ENCOUNTER — Other Ambulatory Visit: Payer: Medicaid Other

## 2021-05-29 DIAGNOSIS — Z3480 Encounter for supervision of other normal pregnancy, unspecified trimester: Secondary | ICD-10-CM

## 2022-04-08 ENCOUNTER — Encounter (HOSPITAL_BASED_OUTPATIENT_CLINIC_OR_DEPARTMENT_OTHER): Payer: Self-pay | Admitting: Emergency Medicine

## 2022-04-08 ENCOUNTER — Other Ambulatory Visit: Payer: Self-pay

## 2022-04-08 ENCOUNTER — Emergency Department (HOSPITAL_BASED_OUTPATIENT_CLINIC_OR_DEPARTMENT_OTHER): Payer: Medicaid Other

## 2022-04-08 ENCOUNTER — Emergency Department (HOSPITAL_BASED_OUTPATIENT_CLINIC_OR_DEPARTMENT_OTHER)
Admission: EM | Admit: 2022-04-08 | Discharge: 2022-04-08 | Disposition: A | Discharge status: Home / Self Care | Payer: Medicaid Other | Attending: Emergency Medicine | Admitting: Emergency Medicine

## 2022-04-08 DIAGNOSIS — Z79899 Other long term (current) drug therapy: Secondary | ICD-10-CM | POA: Insufficient documentation

## 2022-04-08 DIAGNOSIS — K769 Liver disease, unspecified: Secondary | ICD-10-CM

## 2022-04-08 DIAGNOSIS — K529 Noninfective gastroenteritis and colitis, unspecified: Secondary | ICD-10-CM | POA: Diagnosis not present

## 2022-04-08 DIAGNOSIS — E039 Hypothyroidism, unspecified: Secondary | ICD-10-CM | POA: Diagnosis not present

## 2022-04-08 DIAGNOSIS — R112 Nausea with vomiting, unspecified: Secondary | ICD-10-CM | POA: Diagnosis present

## 2022-04-08 LAB — URINALYSIS, ROUTINE W REFLEX MICROSCOPIC
Bacteria, UA: NONE SEEN
Bilirubin Urine: NEGATIVE
Glucose, UA: NEGATIVE mg/dL
Hgb urine dipstick: NEGATIVE
Nitrite: NEGATIVE
Protein, ur: 30 mg/dL — AB
Specific Gravity, Urine: 1.031 — ABNORMAL HIGH (ref 1.005–1.030)
pH: 5.5 (ref 5.0–8.0)

## 2022-04-08 LAB — CBC
HCT: 37.7 % (ref 36.0–46.0)
Hemoglobin: 12.7 g/dL (ref 12.0–15.0)
MCH: 31.1 pg (ref 26.0–34.0)
MCHC: 33.7 g/dL (ref 30.0–36.0)
MCV: 92.2 fL (ref 80.0–100.0)
Platelets: 311 10*3/uL (ref 150–400)
RBC: 4.09 MIL/uL (ref 3.87–5.11)
RDW: 13.6 % (ref 11.5–15.5)
WBC: 9.8 10*3/uL (ref 4.0–10.5)
nRBC: 0 % (ref 0.0–0.2)

## 2022-04-08 LAB — COMPREHENSIVE METABOLIC PANEL
ALT: 16 U/L (ref 0–44)
AST: 19 U/L (ref 15–41)
Albumin: 4.1 g/dL (ref 3.5–5.0)
Alkaline Phosphatase: 49 U/L (ref 38–126)
Anion gap: 7 (ref 5–15)
BUN: 13 mg/dL (ref 6–20)
CO2: 23 mmol/L (ref 22–32)
Calcium: 8.8 mg/dL — ABNORMAL LOW (ref 8.9–10.3)
Chloride: 108 mmol/L (ref 98–111)
Creatinine, Ser: 0.59 mg/dL (ref 0.44–1.00)
GFR, Estimated: >60 mL/min (ref >60)
Glucose, Bld: 98 mg/dL (ref 70–99)
Potassium: 3.6 mmol/L (ref 3.5–5.1)
Sodium: 138 mmol/L (ref 135–145)
Total Bilirubin: 0.5 mg/dL (ref 0.3–1.2)
Total Protein: 6.7 g/dL (ref 6.5–8.1)

## 2022-04-08 LAB — LIPASE, BLOOD: Lipase: 19 U/L (ref 11–51)

## 2022-04-08 LAB — HCG, SERUM, QUALITATIVE: Preg, Serum: NEGATIVE

## 2022-04-08 MED ORDER — LACTATED RINGERS IV BOLUS
1000.0000 mL | Freq: Once | INTRAVENOUS | Status: AC
Start: 2022-04-08 — End: 2022-04-08
  Administered 2022-04-08: 1000 mL via INTRAVENOUS

## 2022-04-08 MED ORDER — IOHEXOL 350 MG/ML SOLN
75.0000 mL | Freq: Once | INTRAVENOUS | Status: AC | PRN
  Administered 2022-04-08: 75 mL via INTRAVENOUS

## 2022-04-08 MED ORDER — DIPHENHYDRAMINE HCL 50 MG/ML IJ SOLN
25.0000 mg | Freq: Once | INTRAMUSCULAR | Status: AC
  Administered 2022-04-08: 25 mg via INTRAVENOUS
  Filled 2022-04-08: qty 1

## 2022-04-08 MED ORDER — ONDANSETRON HCL 4 MG/2ML IJ SOLN
4.0000 mg | Freq: Once | INTRAMUSCULAR | Status: AC
  Administered 2022-04-08: 4 mg via INTRAVENOUS
  Filled 2022-04-08: qty 2

## 2022-04-08 MED ORDER — DROPERIDOL 2.5 MG/ML IJ SOLN
2.5000 mg | Freq: Once | INTRAMUSCULAR | Status: AC
  Administered 2022-04-08: 2.5 mg via INTRAVENOUS
  Filled 2022-04-08: qty 2

## 2022-04-08 MED ORDER — MORPHINE SULFATE (PF) 4 MG/ML IV SOLN
4.0000 mg | Freq: Once | INTRAVENOUS | Status: AC
  Administered 2022-04-08: 4 mg via INTRAVENOUS
  Filled 2022-04-08: qty 1

## 2022-04-08 MED ORDER — HYDROCODONE-ACETAMINOPHEN 5-325 MG PO TABS
1.0000 | ORAL_TABLET | Freq: Once | ORAL | Status: AC
  Administered 2022-04-08: 1 via ORAL
  Filled 2022-04-08: qty 1

## 2022-04-08 MED ORDER — ONDANSETRON 4 MG PO TBDP: 4.0000 mg | ORAL_TABLET | Freq: Three times a day (TID) | ORAL | 0 refills | Status: DC | PRN

## 2022-04-08 MED ORDER — FAMOTIDINE IN NACL 20-0.9 MG/50ML-% IV SOLN
20.0000 mg | Freq: Once | INTRAVENOUS | Status: AC
  Administered 2022-04-08: 20 mg via INTRAVENOUS
  Filled 2022-04-08: qty 50

## 2022-04-08 MED ORDER — METOCLOPRAMIDE HCL 5 MG/ML IJ SOLN
10.0000 mg | Freq: Once | INTRAMUSCULAR | Status: AC
  Administered 2022-04-08: 10 mg via INTRAVENOUS
  Filled 2022-04-08: qty 2

## 2022-04-08 NOTE — Discharge Instructions (Signed)
You were seen for your viral bug (gastroenteritis) in the emergency department.  At home, please take the Zofran for your nausea and vomiting. Please be sure to stay well-hydrated.  Follow-up with your primary doctor in 2-3 days regarding your visit and the incidental liver lesion that was found on your CT scan to see if it requires additional follow-up.  Please also follow-up with the GI doctors.  Return immediately to the emergency department if you experience any of the following: fainting, abdominal pain, high fevers, or any other concerning symptoms.  Thank you for visiting our Emergency Department. It was a pleasure taking care of you today.

## 2022-04-08 NOTE — ED Triage Notes (Signed)
Pt arrives to ED with c/o abdominal pain. She notes chronic abdominal pain however became acute today. She notes 35 episodes of emesis today. Associated with constipation. Noted to be dull with burning sensation. She notes mucous to stools.

## 2022-04-08 NOTE — ED Provider Notes (Signed)
[facility] Provider Note   CSN: [id number] Arrival date & time: 04/08/22  1520     History  Chief Complaint  Patient presents with   Abdominal Pain    Jody Carter is a 27 y.o. female.  27 year old female with a history of IBS who presents to the emergency department with abdominal pain and nausea and vomiting.  States that for the past 3 weeks has had severe epigastric abdominal pain.  Worsened recently and describes it as being stabbed by a hot poker.  Says that she has had over 30 episodes of nonbloody nonbilious emesis today.  Thinks that on 1 accounts looks like coffee grounds.  Says that she did have a small bowel movement this morning.  With her pain worsening decided to come into the emergency department for evaluation. Denies any frequent marijuana use or heavy alcohol use.       Home Medications Prior to Admission medications   Medication Sig Start Date End Date Taking? Authorizing Provider  ondansetron (ZOFRAN-ODT) 4 MG disintegrating tablet Take 1 tablet (4 mg total) by mouth every 8 (eight) hours as needed for nausea or vomiting. 04/08/22  Yes Fransico Meadow, MD  levETIRAcetam (KEPPRA) 100 MG/ML solution Take by mouth 2 (two) times daily.    [provider]  levothyroxine (SYNTHROID, LEVOTHROID) 88 MCG tablet Take 1 tablet (88 mcg total) by mouth daily before breakfast. For hypothyroidism 04/14/17   Money, Lowry Ram, FNP  promethazine (PHENERGAN) 25 MG tablet Take 1 tablet (25 mg total) by mouth every 6 (six) hours as needed for nausea or vomiting. 04/20/21   Estill Dooms, NP      Allergies    Sumatriptan    Review of Systems   Review of Systems  Physical Exam Updated Vital Signs BP 112/79   Pulse 67   Temp 98.2 F (36.8 C)   Resp (!) 21   Ht 5\' 3"  (1.6 m)   Wt 56.2 kg   SpO2 100%   BMI 21.97 kg/m  Physical Exam Vitals and nursing note reviewed.  Constitutional:      General: She is not  in acute distress.    Appearance: She is well-developed.  HENT:     Head: Normocephalic and atraumatic.     Right Ear: External ear normal.     Left Ear: External ear normal.     Nose: Nose normal.  Eyes:     Extraocular Movements: Extraocular movements intact.     Conjunctiva/sclera: Conjunctivae normal.     Pupils: Pupils are equal, round, and reactive to light.  Cardiovascular:     Rate and Rhythm: Normal rate and regular rhythm.     Heart sounds: No murmur heard. Pulmonary:     Effort: Pulmonary effort is normal. No respiratory distress.  Abdominal:     General: Abdomen is flat. There is no distension.     Palpations: Abdomen is soft. There is no mass.     Tenderness: There is abdominal tenderness (Epigastrium). There is guarding.  Musculoskeletal:     Cervical back: Normal range of motion and neck supple.     Right lower leg: No edema.     Left lower leg: No edema.  Skin:    General: Skin is warm and dry.  Neurological:     Mental Status: She is alert and oriented to person, place, and time. Mental status is at baseline.  Psychiatric:        Mood  and Affect: Mood normal.     ED Results / Procedures / Treatments   Labs (all labs ordered are listed, but only abnormal results are displayed) Labs Reviewed  COMPREHENSIVE METABOLIC PANEL - Abnormal; Notable for the following components:      Result Value   Calcium 8.8 (*)    All other components within normal limits  URINALYSIS, ROUTINE W REFLEX MICROSCOPIC - Abnormal; Notable for the following components:   APPearance CLOUDY (*)    Specific Gravity, Urine 1.031 (*)    Ketones, ur TRACE (*)    Protein, ur 30 (*)    Leukocytes,Ua SMALL (*)    All other components within normal limits  LIPASE, BLOOD  CBC  HCG, SERUM, QUALITATIVE    EKG None  Radiology CT ABDOMEN PELVIS W CONTRAST  Result Date: 04/08/2022 CLINICAL DATA:  Abdominal pain, constipation common nausea and vomiting. EXAM: CT ABDOMEN AND PELVIS WITH  CONTRAST TECHNIQUE: Multidetector CT imaging of the abdomen and pelvis was performed using the standard protocol following bolus administration of intravenous contrast. RADIATION DOSE REDUCTION: This exam was performed according to the departmental dose-optimization program which includes automated exposure control, adjustment of the mA and/or kV according to patient size and/or use of iterative reconstruction technique. CONTRAST:  80mL OMNIPAQUE IOHEXOL 350 MG/ML SOLN COMPARISON:  Ultrasound December 14, 2017 FINDINGS: Lower chest: No acute abnormality. Partially visualized right breast prosthesis Hepatobiliary: Ill-defined hypodensity in the medial left lobe and caudate lobe of the liver on image 16/2. Gallbladder surgically absent. New intra and extrahepatic biliary ductal dilation with the common duct measuring up to 12 mm. Pancreas: No pancreatic ductal dilation or evidence of acute inflammation. Spleen: No splenomegaly or focal splenic lesion. Adrenals/Urinary Tract: Bilateral adrenal glands appear normal. No hydronephrosis. Kidneys demonstrate symmetric enhancement. Urinary bladder is unremarkable for degree of distension. Stomach/Bowel: No radiopaque enteric contrast material was administered. Wall thickening versus underdistention of the gastric antrum. No pathologic dilation of small or large bowel. Prominent fluid-filled loops of distal small bowel. Gas fluid levels throughout the colon. Vascular/Lymphatic: Normal caliber abdominal aorta. Smooth IVC contours. No pathologically enlarged abdominal or pelvic lymph nodes. Reproductive: Heterogeneous uterine enhancement. Hypodense homogeneous 3.2 cm cyst in the right ovary is considered benign/physiologic and requires no independent imaging follow-up. Other: Trace pelvic free fluid. Musculoskeletal: No acute osseous abnormality. IMPRESSION: 1. Prominent fluid-filled loops of distal small bowel with gas fluid levels throughout the colon, as can be seen with  enteritis and diarrheal illness. 2. Status post cholecystectomy there is new intra and extrahepatic biliary ductal dilation with the common duct measuring up to 12 mm. Possibly reflecting reservoir effect post cholecystectomy suggest correlation with laboratory values (T bili) to exclude biliary obstruction. If laboratory and clinical picture are concerning for biliary obstruction further evaluation with pre and postcontrast enhanced MRCP is recommended. 3. Ill-defined hypodensity in the medial left lobe and caudate lobe of the liver, which is nonspecific but favored to reflect focal fatty infiltration. Consider attention on aforementioned MRCP if obtained otherwise further evaluation with nonemergent hepatic protocol MRI with and without contrast. 4. Wall thickening versus underdistention of the gastric antrum, correlate for gastritis. 5. Trace pelvic free fluid, likely physiologic. Electronically Signed   By: Dahlia Bailiff M.D.   On: 04/08/2022 18:52    Procedures Procedures    Medications Ordered in ED Medications  lactated ringers bolus 1,000 mL (0 mLs Intravenous Stopped 04/08/22 2107)  morphine (PF) 4 MG/ML injection 4 mg (4 mg Intravenous Given 04/08/22 1758)  ondansetron [facility]) injection 4 mg (4 mg Intravenous Given 04/08/22 1758)  famotidine (PEPCID) IVPB 20 mg premix (0 mg Intravenous Stopped 04/08/22 1830)  iohexol (OMNIPAQUE) 350 MG/ML injection 75 mL (75 mLs Intravenous Contrast Given 04/08/22 1813)  metoCLOPramide (REGLAN) injection 10 mg (10 mg Intravenous Given 04/08/22 1937)  HYDROcodone-acetaminophen (NORCO/VICODIN) 5-325 MG per tablet 1 tablet (1 tablet Oral Given 04/08/22 2005)  droperidol (INAPSINE) 2.5 MG/ML injection 2.5 mg (2.5 mg Intravenous Given 04/08/22 2040)  diphenhydrAMINE (BENADRYL) injection 25 mg (25 mg Intravenous Given 04/08/22 2103)    ED Course/ Medical Decision Making/ A&P Clinical Course as of 04/09/22 1116  Thu Apr 08, 2022  1905 CT with enteritis and  gastritis.  [RP]    Clinical Course User Index [RP] Fransico Meadow, MD                             Medical Decision Making Amount and/or Complexity of Data Reviewed Labs: ordered. Radiology: ordered.  Risk Prescription drug management.   Evelette NATAVIA GROTH is a 27 y.o. female with comorbidities that complicate the patient evaluation including IBS who presents emergency department with epigastric abdominal pain and nausea and vomiting  Initial Ddx:  IBS, pancreatitis, gastroenteritis, bowel obstruction  MDM:  Feel the patient symptoms may be due to her IBS.  With her nausea and vomiting could potentially have bowel obstruction as well so with her significant tenderness guarding profuse vomiting we will obtain CT scan.  Per GI note has a history of multiple abdominal CTs but last that I see is from 2005.  Denies any significant alcohol use but will obtain lipase to assess for pancreatitis or epigastric abdominal pain.  If workup is negative could potentially be reflective of gastroenteritis.  Plan:  Labs Lipase Zofran IV fluids CT abdomen pelvis IV contrast  ED Summary/Re-evaluation:  Patient reassessed and was having persistent nausea and vomiting.  CT scan without concerning findings.  Was given multiple rounds of antiemetics including Reglan and droperidol.  Was able to tolerate small amount of p.o. intake and was requesting to go home.  Given prescription for Zofran and ODT and instructed to follow-up with her primary doctor and GI regarding her symptoms and incidental hepatic lesion found on her CT.  Suspect that it likely is due to gastroenteritis and/or IBS.  This patient presents to the ED for concern of complaints listed in HPI, this involves an extensive number of treatment options, and is a complaint that carries with it a high risk of complications and morbidity. Disposition including potential need for admission considered.   Dispo: DC Home. Return precautions  discussed including, but not limited to, those listed in the AVS. Allowed pt time to ask questions which were answered fully prior to dc.  Additional history obtained from family Records reviewed Outpatient Clinic Notes The following labs were independently interpreted: Chemistry and show no acute abnormality I independently reviewed the following imaging with scope of interpretation limited to determining acute life threatening conditions related to emergency care: CT Abdomen/Pelvis and agree with the radiologist interpretation with the following exceptions: none I personally reviewed and interpreted cardiac monitoring: normal sinus rhythm  I personally reviewed and interpreted the pt's EKG: see above for interpretation  I have reviewed the patients home medications and made adjustments as needed  Final Clinical Impression(s) / ED Diagnoses Final diagnoses:  Gastroenteritis  Liver lesion    Rx / DC Orders ED Discharge Orders  Ordered    ondansetron (ZOFRAN-ODT) 4 MG disintegrating tablet  Every 8 hours PRN        04/08/22 2117              Fransico Meadow, MD 04/09/22 1116

## 2022-09-07 IMAGING — US US OB < 14 WEEKS - US OB TV
1 series · 15 of 28 positions shown · non-contrast
Comparison: None.

CLINICAL DATA: Abdominal cramping. Beta quantitative beta HCG of
[DATE]. Last menstrual 02/09/21. Gestational age by last menstrual
period 9 weeks and 3 days. Estimated due date by last menstrual
11/16/2021.

EXAM:
OBSTETRIC <14 WK ULTRASOUND
TECHNIQUE: Transabdominal ultrasound was performed for evaluation of the
gestation as well as the maternal uterus and adnexal regions.

[Series 1: us ob < 14 weeks - us ob tv · 15 of 94 slices shown]
[im 1/94]
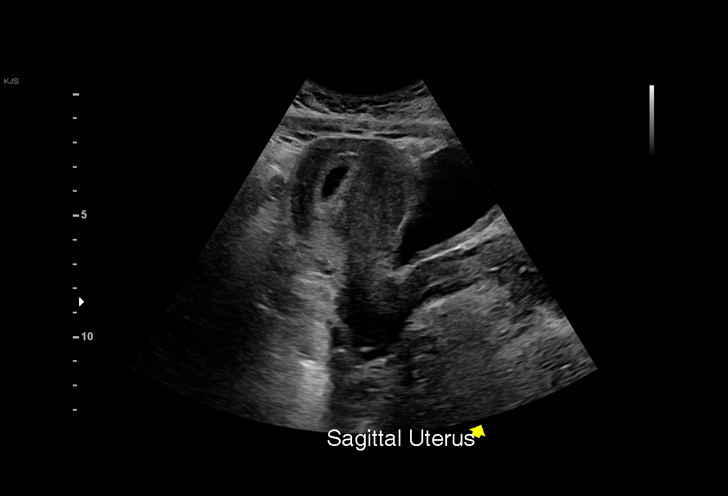
[im 7/94]
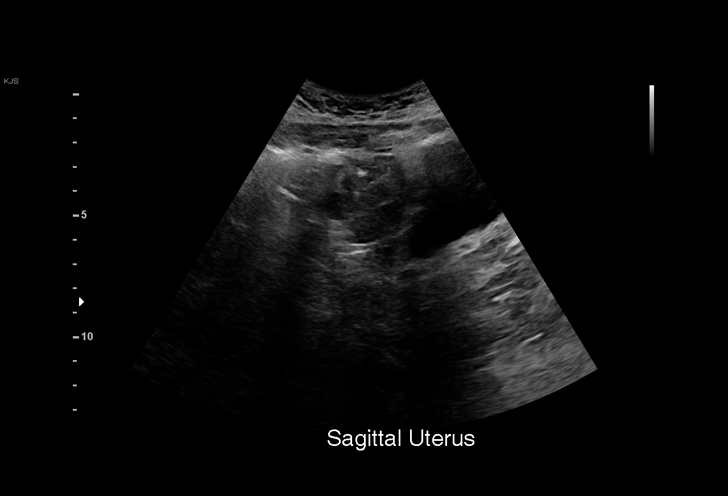
[im 14/94]
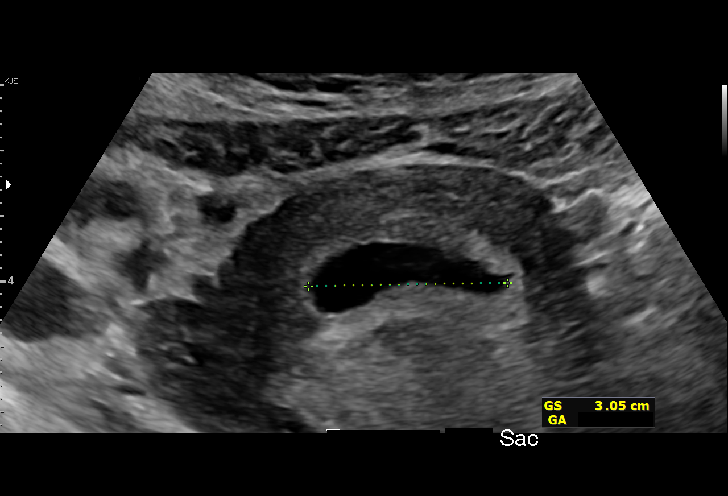
[im 21/94]
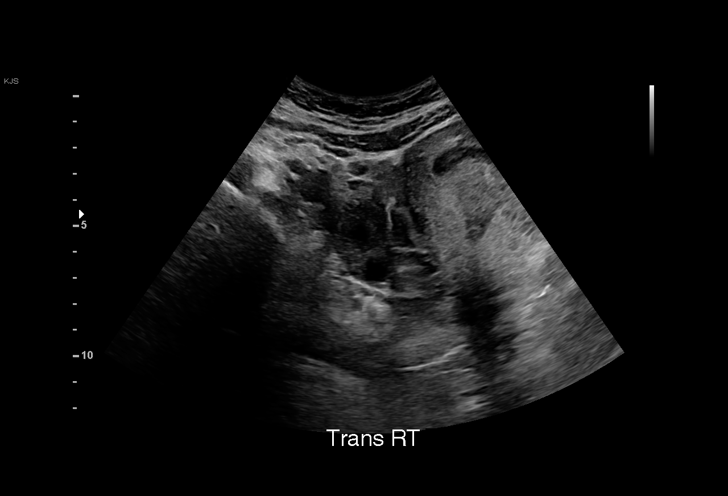
[im 28/94]
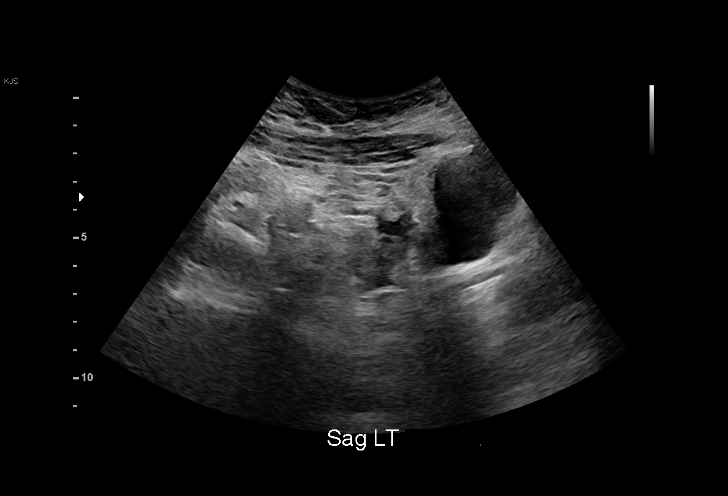
[im 35/94]
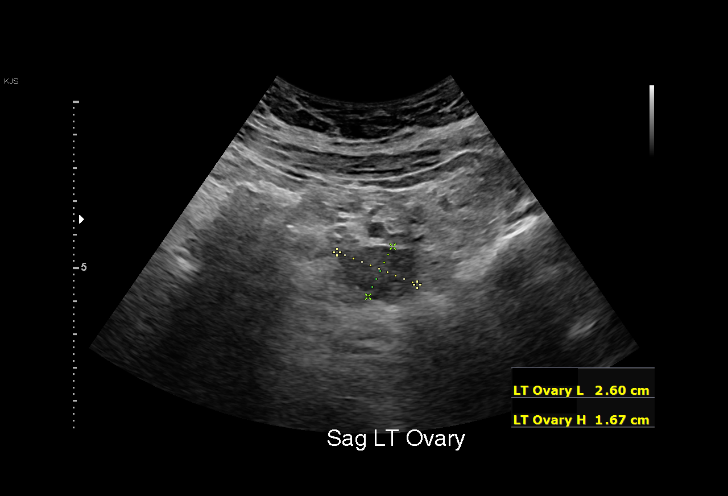
[im 42/94]
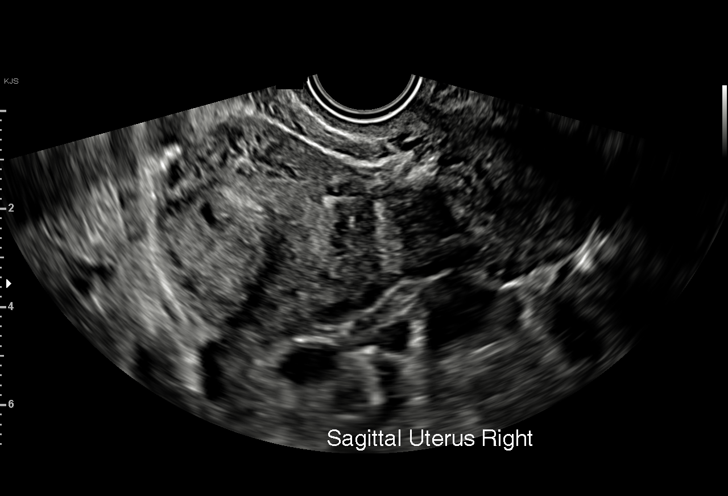
[im 49/94]
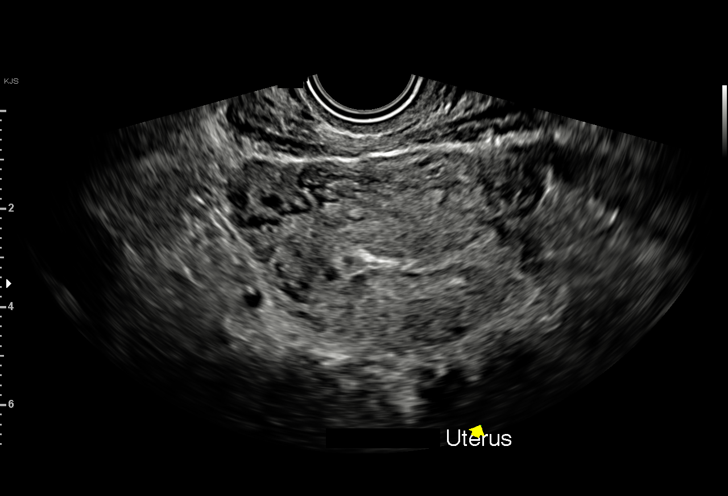
[im 52/94]
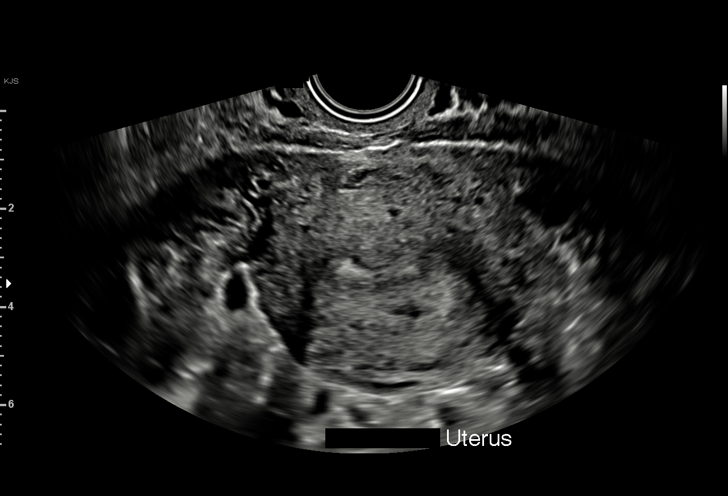
[im 59/94]
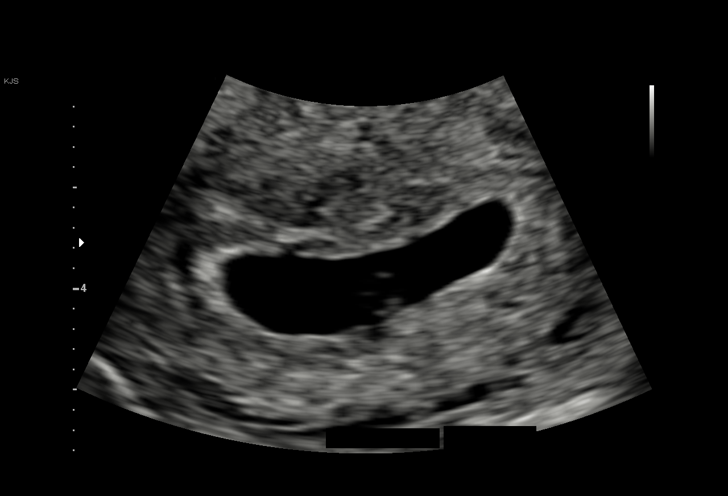
[im 66/94]
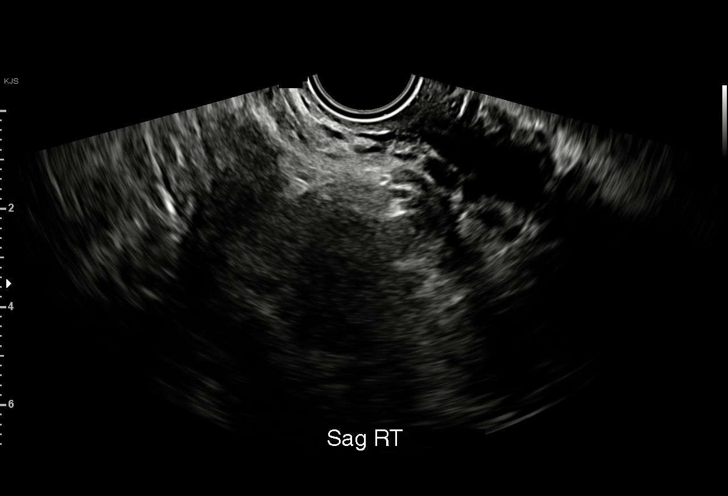
[im 73/94]
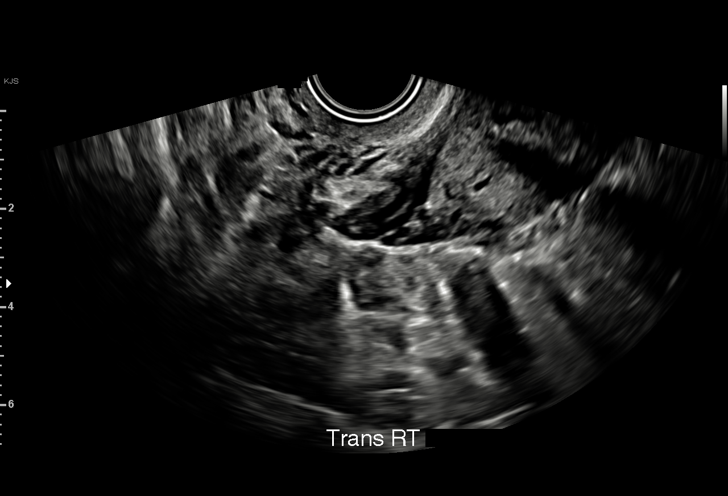
[im 80/94]
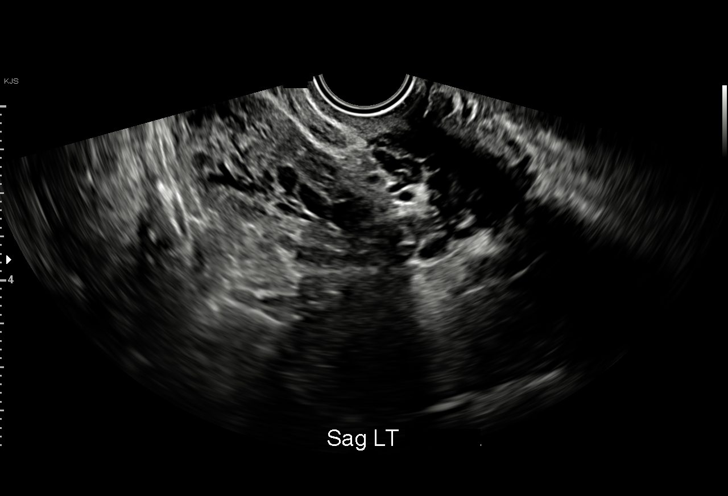
[im 87/94]
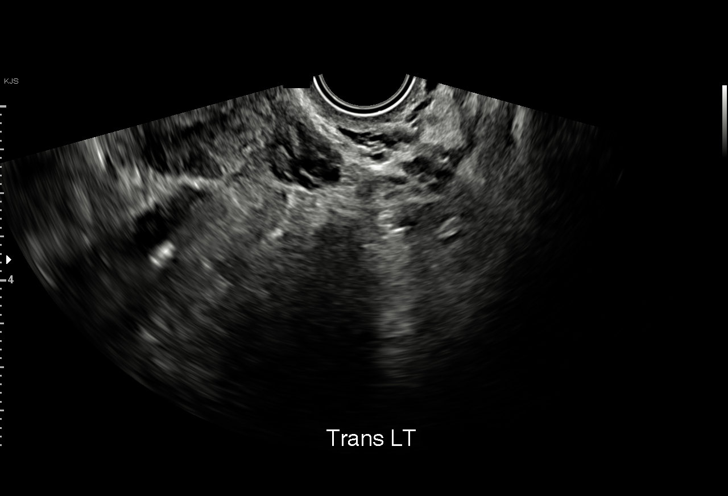
[im 94/94]
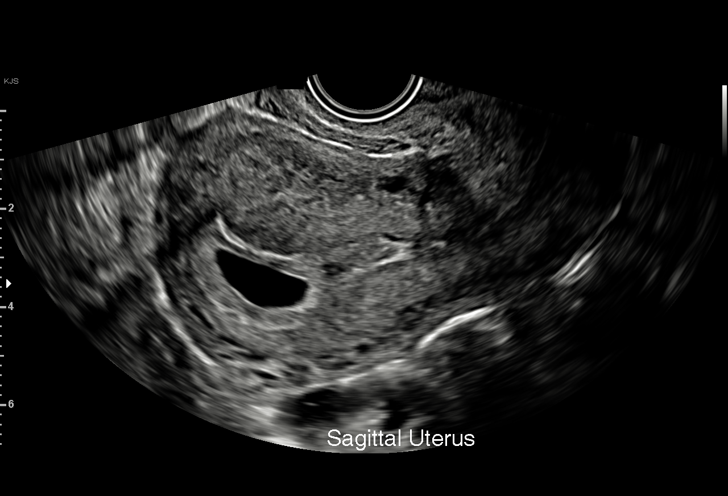

[15 of 28 positions shown; findings below may reference images not displayed]

FINDINGS: Intrauterine gestational sac: Single

Yolk sac:  Visualized.

Embryo:  Not Visualized.

Cardiac Activity: Not Visualized.

MSD: 19.5  mm   6 w   6  d

Subchorionic hemorrhage:  None visualized.

Maternal uterus/adnexae: Bilateral ovaries unremarkable. There is a
corpus luteum cyst within the right ovary.

Other: No free fluid.
IMPRESSION: Intrauterine gestational sac and yolk sac with mean sac diameter of
19.5 mm. No embryo identified. Estimated due date by ultrasound of 6
weeks and 6 days non-concordant with gestational age by last
menstrual period 9 weeks and 3 days. Findings are suspicious but not
yet definitive for failed pregnancy. Recommend follow-up US in 10-14
days for definitive diagnosis. This recommendation follows SRU
consensus guidelines: Diagnostic Criteria for Nonviable Pregnancy
Early in the First Trimester. N Engl J Med 6233; [DATE].

## 2022-11-27 ENCOUNTER — Emergency Department (HOSPITAL_BASED_OUTPATIENT_CLINIC_OR_DEPARTMENT_OTHER)
Admission: EM | Admit: 2022-11-27 | Discharge: 2022-11-27 | Disposition: A | Discharge status: Home / Self Care | Payer: Medicare Other | Attending: Emergency Medicine | Admitting: Emergency Medicine

## 2022-11-27 ENCOUNTER — Encounter (HOSPITAL_BASED_OUTPATIENT_CLINIC_OR_DEPARTMENT_OTHER): Payer: Self-pay | Admitting: Emergency Medicine

## 2022-11-27 ENCOUNTER — Emergency Department (HOSPITAL_BASED_OUTPATIENT_CLINIC_OR_DEPARTMENT_OTHER): Payer: Medicare Other

## 2022-11-27 DIAGNOSIS — R112 Nausea with vomiting, unspecified: Secondary | ICD-10-CM | POA: Diagnosis not present

## 2022-11-27 DIAGNOSIS — R140 Abdominal distension (gaseous): Secondary | ICD-10-CM | POA: Insufficient documentation

## 2022-11-27 DIAGNOSIS — R1084 Generalized abdominal pain: Secondary | ICD-10-CM | POA: Diagnosis present

## 2022-11-27 LAB — CBC
HCT: 39.7 % (ref 36.0–46.0)
Hemoglobin: 13.4 g/dL (ref 12.0–15.0)
MCH: 31.1 pg (ref 26.0–34.0)
MCHC: 33.8 g/dL (ref 30.0–36.0)
MCV: 92.1 fL (ref 80.0–100.0)
Platelets: 343 10*3/uL (ref 150–400)
RBC: 4.31 MIL/uL (ref 3.87–5.11)
RDW: 13.2 % (ref 11.5–15.5)
WBC: 9.4 10*3/uL (ref 4.0–10.5)
nRBC: 0 % (ref 0.0–0.2)

## 2022-11-27 LAB — COMPREHENSIVE METABOLIC PANEL
ALT: 11 U/L (ref 0–44)
AST: 14 U/L — ABNORMAL LOW (ref 15–41)
Albumin: 4.2 g/dL (ref 3.5–5.0)
Alkaline Phosphatase: 56 U/L (ref 38–126)
Anion gap: 8 (ref 5–15)
BUN: 11 mg/dL (ref 6–20)
CO2: 26 mmol/L (ref 22–32)
Calcium: 9.1 mg/dL (ref 8.9–10.3)
Chloride: 105 mmol/L (ref 98–111)
Creatinine, Ser: 0.65 mg/dL (ref 0.44–1.00)
GFR, Estimated: >60 mL/min (ref >60)
Glucose, Bld: 93 mg/dL (ref 70–99)
Potassium: 3.7 mmol/L (ref 3.5–5.1)
Sodium: 139 mmol/L (ref 135–145)
Total Bilirubin: 0.4 mg/dL (ref 0.3–1.2)
Total Protein: 6.4 g/dL — ABNORMAL LOW (ref 6.5–8.1)

## 2022-11-27 LAB — URINALYSIS, ROUTINE W REFLEX MICROSCOPIC
Bilirubin Urine: NEGATIVE
Glucose, UA: NEGATIVE mg/dL
Hgb urine dipstick: NEGATIVE
Leukocytes,Ua: NEGATIVE
Nitrite: NEGATIVE
Protein, ur: NEGATIVE mg/dL
Specific Gravity, Urine: 1.036 — ABNORMAL HIGH (ref 1.005–1.030)
pH: 6 (ref 5.0–8.0)

## 2022-11-27 LAB — PREGNANCY, URINE: Preg Test, Ur: NEGATIVE

## 2022-11-27 LAB — LIPASE, BLOOD: Lipase: 34 U/L (ref 11–51)

## 2022-11-27 MED ORDER — FENTANYL CITRATE PF 50 MCG/ML IJ SOSY
50.0000 ug | PREFILLED_SYRINGE | Freq: Once | INTRAMUSCULAR | Status: AC
  Administered 2022-11-27: 50 ug via INTRAVENOUS
  Filled 2022-11-27: qty 1

## 2022-11-27 MED ORDER — SODIUM CHLORIDE 0.9 % IV BOLUS
1000.0000 mL | Freq: Once | INTRAVENOUS | Status: AC
  Administered 2022-11-27: 1000 mL via INTRAVENOUS

## 2022-11-27 MED ORDER — IOHEXOL 300 MG/ML  SOLN
100.0000 mL | Freq: Once | INTRAMUSCULAR | Status: AC | PRN
  Administered 2022-11-27: 100 mL via INTRAVENOUS

## 2022-11-27 MED ORDER — ONDANSETRON HCL 4 MG/2ML IJ SOLN
4.0000 mg | Freq: Once | INTRAMUSCULAR | Status: AC
  Administered 2022-11-27: 4 mg via INTRAVENOUS
  Filled 2022-11-27: qty 2

## 2022-11-27 MED ORDER — HYDROMORPHONE HCL 1 MG/ML IJ SOLN
0.5000 mg | Freq: Once | INTRAMUSCULAR | Status: AC
  Administered 2022-11-27: 0.5 mg via INTRAVENOUS
  Filled 2022-11-27: qty 1

## 2022-11-27 NOTE — ED Notes (Signed)
RN in to discharge patient, patient ripped out her IV and slammed door, patient then left without AVS.

## 2022-11-27 NOTE — ED Triage Notes (Signed)
Abdo pain, n/v unable to keep food down for the last week. Bloating and coffee ground emesis reported

## 2022-11-27 NOTE — ED Provider Notes (Signed)
[facility] AT [facility] Provider Note   CSN: [id number] Arrival date & time: 11/27/22  2022     History  Chief Complaint  Patient presents with   Abdominal Pain    Jody Carter is a 27 y.o. female.  Patient with a history of migraines, anxiety, fibromyalgia, IBS, seizures, thyroid disease presents with progressively worsening abdominal pain and distention over the last 1-2 days. No fever. She reports nausea with vomiting, unable to hold down liquids or solids. No diarrhea. She reports last bowel movement was 2-3 days ago. Not passing gas or having excessive belching. She believes emesis appears coffee ground.   The history is provided by the patient. No language interpreter was used.  Abdominal Pain      Home Medications Prior to Admission medications   Medication Sig Start Date End Date Taking? Authorizing Provider  levETIRAcetam (KEPPRA) 100 MG/ML solution Take by mouth 2 (two) times daily.    [provider]  levothyroxine (SYNTHROID, LEVOTHROID) 88 MCG tablet Take 1 tablet (88 mcg total) by mouth daily before breakfast. For hypothyroidism 04/14/17   Money, Gerlene Burdock, FNP  ondansetron (ZOFRAN-ODT) 4 MG disintegrating tablet Take 1 tablet (4 mg total) by mouth every 8 (eight) hours as needed for nausea or vomiting. 04/08/22   Rondel Baton, MD  promethazine (PHENERGAN) 25 MG tablet Take 1 tablet (25 mg total) by mouth every 6 (six) hours as needed for nausea or vomiting. 04/20/21   Adline Potter, NP      Allergies    Sumatriptan    Review of Systems   Review of Systems  Gastrointestinal:  Positive for abdominal pain.    Physical Exam Updated Vital Signs BP 117/66   Pulse 80   Temp 97.8 F (36.6 C) (Oral)   Resp 18   LMP 11/22/2022 (Approximate)   SpO2 97%  Physical Exam Vitals and nursing note reviewed.  Constitutional:      Appearance: She is well-developed.  Cardiovascular:     Rate and Rhythm: Normal rate  and regular rhythm.     Heart sounds: No murmur heard. Abdominal:     General: Bowel sounds are increased. There is distension.     Palpations: Abdomen is soft.     Tenderness: There is generalized abdominal tenderness.  Skin:    General: Skin is warm and dry.  Neurological:     Mental Status: She is alert and oriented to person, place, and time.     ED Results / Procedures / Treatments   Labs (all labs ordered are listed, but only abnormal results are displayed) Labs Reviewed  COMPREHENSIVE METABOLIC PANEL - Abnormal; Notable for the following components:      Result Value   Total Protein 6.4 (*)    AST 14 (*)    All other components within normal limits  URINALYSIS, ROUTINE W REFLEX MICROSCOPIC - Abnormal; Notable for the following components:   Specific Gravity, Urine 1.036 (*)    Ketones, ur TRACE (*)    All other components within normal limits  LIPASE, BLOOD  CBC  PREGNANCY, URINE   Results for orders placed or performed during the hospital encounter of 11/27/22  Lipase, blood  Result Value Ref Range   Lipase 34 11 - 51 U/L  Comprehensive metabolic panel  Result Value Ref Range   Sodium 139 135 - 145 mmol/L   Potassium 3.7 3.5 - 5.1 mmol/L   Chloride 105 98 - 111 mmol/L   CO2  26 22 - 32 mmol/L   Glucose, Bld 93 70 - 99 mg/dL   BUN 11 6 - 20 mg/dL   Creatinine, Ser 2.95 0.44 - 1.00 mg/dL   Calcium 9.1 8.9 - 28.4 mg/dL   Total Protein 6.4 (L) 6.5 - 8.1 g/dL   Albumin 4.2 3.5 - 5.0 g/dL   AST 14 (L) 15 - 41 U/L   ALT 11 0 - 44 U/L   Alkaline Phosphatase 56 38 - 126 U/L   Total Bilirubin 0.4 0.3 - 1.2 mg/dL   GFR, Estimated >13 >24 mL/min   Anion gap 8 5 - 15  CBC  Result Value Ref Range   WBC 9.4 4.0 - 10.5 K/uL   RBC 4.31 3.87 - 5.11 MIL/uL   Hemoglobin 13.4 12.0 - 15.0 g/dL   HCT 40.1 02.7 - 25.3 %   MCV 92.1 80.0 - 100.0 fL   MCH 31.1 26.0 - 34.0 pg   MCHC 33.8 30.0 - 36.0 g/dL   RDW 66.4 40.3 - 47.4 %   Platelets 343 150 - 400 K/uL   nRBC 0.0 0.0  - 0.2 %  Urinalysis, Routine w reflex microscopic -Urine, Clean Catch  Result Value Ref Range   Color, Urine YELLOW YELLOW   APPearance CLEAR CLEAR   Specific Gravity, Urine 1.036 (H) 1.005 - 1.030   pH 6.0 5.0 - 8.0   Glucose, UA NEGATIVE NEGATIVE mg/dL   Hgb urine dipstick NEGATIVE NEGATIVE   Bilirubin Urine NEGATIVE NEGATIVE   Ketones, ur TRACE (A) NEGATIVE mg/dL   Protein, ur NEGATIVE NEGATIVE mg/dL   Nitrite NEGATIVE NEGATIVE   Leukocytes,Ua NEGATIVE NEGATIVE  Pregnancy, urine  Result Value Ref Range   Preg Test, Ur NEGATIVE NEGATIVE     EKG None  Radiology CT ABDOMEN PELVIS W CONTRAST  Result Date: 11/27/2022 CLINICAL DATA:  Abdominal pain, bloating, vomiting EXAM: CT ABDOMEN AND PELVIS WITH CONTRAST TECHNIQUE: Multidetector CT imaging of the abdomen and pelvis was performed using the standard protocol following bolus administration of intravenous contrast. RADIATION DOSE REDUCTION: This exam was performed according to the departmental dose-optimization program which includes automated exposure control, adjustment of the mA and/or kV according to patient size and/or use of iterative reconstruction technique. CONTRAST:  OMNIPAQUE IOHEXOL 300 MG/ML  SOLN COMPARISON:  04/08/2022 FINDINGS: Lower chest: No acute abnormality Hepatobiliary: No focal liver abnormality is seen. Status post cholecystectomy. No biliary dilatation. Pancreas: No focal abnormality or ductal dilatation. Spleen: No focal abnormality.  Normal size. Adrenals/Urinary Tract: No adrenal abnormality. No focal renal abnormality. No stones or hydronephrosis. Urinary bladder is unremarkable. Stomach/Bowel: Stomach, large and small bowel grossly unremarkable. Vascular/Lymphatic: No evidence of aneurysm or adenopathy. Reproductive: Uterus and adnexa unremarkable.  No mass. Other: No free fluid or free air. Musculoskeletal: No acute bony abnormality. IMPRESSION: No acute findings in the abdomen or pelvis. Electronically  Signed   By: Charlett Nose M.D.   On: 11/27/2022 22:09    Procedures Procedures    Medications Ordered in ED Medications  sodium chloride 0.9 % bolus 1,000 mL (1,000 mLs Intravenous New Bag/Given 11/27/22 2130)  ondansetron (ZOFRAN) injection 4 mg (4 mg Intravenous Given 11/27/22 2129)  fentaNYL (SUBLIMAZE) injection 50 mcg (50 mcg Intravenous Given 11/27/22 2129)  iohexol (OMNIPAQUE) 300 MG/ML solution 100 mL (100 mLs Intravenous Contrast Given 11/27/22 2150)  HYDROmorphone (DILAUDID) injection 0.5 mg (0.5 mg Intravenous Given 11/27/22 2157)    ED Course/ Medical Decision Making/ A&P Clinical Course as of 11/27/22 2232  Sat  Nov 27, 2022  2225 Patient to ED with acute on chronic symptoms of vomiting and abdominal pain. Labs reviewed: no leukocytosis, electrolytes WNL, urine concentrated but without infection, pregnancy negative. CT scan obtained for further evaluation of severe pain and is negative for acute process. Results discussed with patient and her partner. They voiced frustration that answers aren't found for her. They have seen GI and se states "they won't do anything because I'm epileptic.". I tried to acknowledge understanding of her frustration and advise that GI is the best specialist for her abdominal pain/vomiting with history of IBS. Patient continues to be angry and starts removing her equipment. Discharged home, encouraged to see GI.  [SU]  2231 PATIENT REMOVED HER OWN IV AND LEFT THE DEPARTMENT PRIOR TO RECEIVING DISCHARGE INSTRUCTIONS. [SU]    Clinical Course User Index [SU] Elpidio Anis, PA-C                                 Medical Decision Making Amount and/or Complexity of Data Reviewed Labs: ordered. Radiology: ordered.  Risk Prescription drug management.           Final Clinical Impression(s) / ED Diagnoses Final diagnoses:  Generalized abdominal pain    Rx / DC Orders ED Discharge Orders     None         Danne Harbor 11/27/22  2232    Benjiman Core, MD 11/27/22 2249

## 2023-01-04 ENCOUNTER — Encounter (HOSPITAL_BASED_OUTPATIENT_CLINIC_OR_DEPARTMENT_OTHER): Payer: Self-pay | Admitting: Emergency Medicine

## 2023-01-04 ENCOUNTER — Other Ambulatory Visit: Payer: Self-pay

## 2023-01-04 DIAGNOSIS — Z87891 Personal history of nicotine dependence: Secondary | ICD-10-CM | POA: Insufficient documentation

## 2023-01-04 DIAGNOSIS — R112 Nausea with vomiting, unspecified: Secondary | ICD-10-CM | POA: Insufficient documentation

## 2023-01-04 DIAGNOSIS — R1012 Left upper quadrant pain: Secondary | ICD-10-CM | POA: Insufficient documentation

## 2023-01-04 DIAGNOSIS — R1013 Epigastric pain: Secondary | ICD-10-CM | POA: Diagnosis present

## 2023-01-04 LAB — CBC
HCT: 39.7 % (ref 36.0–46.0)
Hemoglobin: 13.1 g/dL (ref 12.0–15.0)
MCH: 31 pg (ref 26.0–34.0)
MCHC: 33 g/dL (ref 30.0–36.0)
MCV: 93.9 fL (ref 80.0–100.0)
Platelets: 406 10*3/uL — ABNORMAL HIGH (ref 150–400)
RBC: 4.23 MIL/uL (ref 3.87–5.11)
RDW: 13.2 % (ref 11.5–15.5)
WBC: 14.1 10*3/uL — ABNORMAL HIGH (ref 4.0–10.5)
nRBC: 0 % (ref 0.0–0.2)

## 2023-01-04 LAB — COMPREHENSIVE METABOLIC PANEL
ALT: 13 U/L (ref 0–44)
AST: 12 U/L — ABNORMAL LOW (ref 15–41)
Albumin: 4.2 g/dL (ref 3.5–5.0)
Alkaline Phosphatase: 58 U/L (ref 38–126)
Anion gap: 10 (ref 5–15)
BUN: 12 mg/dL (ref 6–20)
CO2: 22 mmol/L (ref 22–32)
Calcium: 9.3 mg/dL (ref 8.9–10.3)
Chloride: 106 mmol/L (ref 98–111)
Creatinine, Ser: 0.58 mg/dL (ref 0.44–1.00)
GFR, Estimated: >60 mL/min (ref >60)
Glucose, Bld: 100 mg/dL — ABNORMAL HIGH (ref 70–99)
Potassium: 3.8 mmol/L (ref 3.5–5.1)
Sodium: 138 mmol/L (ref 135–145)
Total Bilirubin: 0.2 mg/dL (ref <1.2)
Total Protein: 6.6 g/dL (ref 6.5–8.1)

## 2023-01-04 LAB — LIPASE, BLOOD: Lipase: 38 U/L (ref 11–51)

## 2023-01-04 NOTE — ED Triage Notes (Signed)
Patient presents with epigastric pain since Friday night. States "pain radiates to chest". Patient also reports, vomiting and intermittent dark stools with mucus.

## 2023-01-05 ENCOUNTER — Emergency Department (HOSPITAL_BASED_OUTPATIENT_CLINIC_OR_DEPARTMENT_OTHER)
Admission: EM | Admit: 2023-01-05 | Discharge: 2023-01-05 | Disposition: A | Discharge status: Home / Self Care | Payer: Medicare Other | Attending: Emergency Medicine | Admitting: Emergency Medicine

## 2023-01-05 ENCOUNTER — Emergency Department (HOSPITAL_BASED_OUTPATIENT_CLINIC_OR_DEPARTMENT_OTHER): Payer: Medicare Other

## 2023-01-05 DIAGNOSIS — R1012 Left upper quadrant pain: Secondary | ICD-10-CM

## 2023-01-05 LAB — URINALYSIS, ROUTINE W REFLEX MICROSCOPIC
Bilirubin Urine: NEGATIVE
Glucose, UA: NEGATIVE mg/dL
Hgb urine dipstick: NEGATIVE
Ketones, ur: NEGATIVE mg/dL
Leukocytes,Ua: NEGATIVE
Nitrite: NEGATIVE
Protein, ur: NEGATIVE mg/dL
Specific Gravity, Urine: 1.01 (ref 1.005–1.030)
pH: 5.5 (ref 5.0–8.0)

## 2023-01-05 LAB — PREGNANCY, URINE: Preg Test, Ur: NEGATIVE

## 2023-01-05 LAB — TROPONIN I (HIGH SENSITIVITY): Troponin I (High Sensitivity): <2 ng/L (ref <18)

## 2023-01-05 MED ORDER — HALOPERIDOL LACTATE 5 MG/ML IJ SOLN
2.0000 mg | Freq: Once | INTRAMUSCULAR | Status: AC
  Administered 2023-01-05: 2 mg via INTRAVENOUS
  Filled 2023-01-05: qty 1

## 2023-01-05 MED ORDER — MORPHINE SULFATE (PF) 4 MG/ML IV SOLN
4.0000 mg | Freq: Once | INTRAVENOUS | Status: AC
Start: 2023-01-05 — End: 2023-01-05
  Administered 2023-01-05: 4 mg via INTRAVENOUS
  Filled 2023-01-05: qty 1

## 2023-01-05 MED ORDER — HYDROMORPHONE HCL 1 MG/ML IJ SOLN
1.0000 mg | Freq: Once | INTRAMUSCULAR | Status: AC
  Administered 2023-01-05: 1 mg via INTRAVENOUS
  Filled 2023-01-05: qty 1

## 2023-01-05 MED ORDER — SUCRALFATE 1 G PO TABS: 1.0000 g | ORAL_TABLET | Freq: Four times a day (QID) | ORAL | 0 refills | Status: DC | PRN

## 2023-01-05 MED ORDER — FAMOTIDINE 20 MG PO TABS: 20.0000 mg | ORAL_TABLET | Freq: Two times a day (BID) | ORAL | 0 refills | Status: DC

## 2023-01-05 MED ORDER — ONDANSETRON HCL 4 MG/2ML IJ SOLN
4.0000 mg | Freq: Once | INTRAMUSCULAR | Status: AC
  Administered 2023-01-05: 4 mg via INTRAVENOUS
  Filled 2023-01-05: qty 2

## 2023-01-05 MED ORDER — SODIUM CHLORIDE 0.9 % IV BOLUS
1000.0000 mL | Freq: Once | INTRAVENOUS | Status: AC
  Administered 2023-01-05: 1000 mL via INTRAVENOUS

## 2023-01-05 MED ORDER — PANTOPRAZOLE SODIUM 40 MG IV SOLR
40.0000 mg | Freq: Once | INTRAVENOUS | Status: AC
  Administered 2023-01-05: 40 mg via INTRAVENOUS
  Filled 2023-01-05: qty 10

## 2023-01-05 MED ORDER — IOHEXOL 300 MG/ML  SOLN
100.0000 mL | Freq: Once | INTRAMUSCULAR | Status: AC | PRN
  Administered 2023-01-05: 85 mL via INTRAVENOUS

## 2023-01-05 MED ORDER — PROMETHAZINE HCL 25 MG PO TABS: 25.0000 mg | ORAL_TABLET | Freq: Four times a day (QID) | ORAL | 0 refills | Status: AC | PRN

## 2023-01-05 MED ORDER — FAMOTIDINE IN NACL 20-0.9 MG/50ML-% IV SOLN
20.0000 mg | Freq: Once | INTRAVENOUS | Status: AC
  Administered 2023-01-05: 20 mg via INTRAVENOUS
  Filled 2023-01-05: qty 50

## 2023-01-05 NOTE — ED Notes (Signed)
Lab called about add on trop. Will add on.

## 2023-01-05 NOTE — ED Notes (Signed)
Pt transported to CT via wheelchair.

## 2023-01-05 NOTE — Discharge Instructions (Signed)
You were evaluated in the Emergency Department and after careful evaluation, we did not find any emergent condition requiring admission or further testing in the hospital.  Your exam/testing today is overall reassuring.  Use the Carafate as needed for abdominal pain.  Recommend taking the Pepcid twice daily for preventative measures.  Use the Phenergan as needed for nausea.  Follow-up with a gastroenterologist.  Please return to the Emergency Department if you experience any worsening of your condition.   Thank you for allowing [place] to be a part of your care.

## 2023-01-05 NOTE — ED Notes (Signed)
Lab called again about initial troponin, said there was a problem with the label and they are running it now.

## 2023-01-05 NOTE — ED Notes (Signed)
Pt complaining of sudden severe abdominal pain, MD notified.

## 2023-01-05 NOTE — ED Notes (Signed)
First trop negative, MD states no need for second trop.

## 2023-01-05 NOTE — ED Provider Notes (Signed)
[facility] Emergency Department Provider Note MRN:  [id number]  Arrival date & time: 01/05/23     Chief Complaint   Abdominal Pain   History of Present Illness   Jody Carter is a 27 y.o. year-old female with a history of IBS presenting to the ED with chief complaint of abdominal pain.  Worsening epigastric and left upper quadrant abdominal pain for the past 5 days, becoming severe, occasionally radiating to the chest.  Stool color change, symptoms very light, sometimes dark.  Associated with nausea vomiting, mucus in stool.  Review of Systems  A thorough review of systems was obtained and all systems are negative except as noted in the HPI and PMH.   Patient's Health History    Past Medical History:  Diagnosis Date   Anxiety    Depression    Fibromyalgia 2022   Hx of migraines    IBS (irritable bowel syndrome)    Panic disorder    Seizures (HCC)    Thyroid disease     Past Surgical History:  Procedure Laterality Date   ADENOIDECTOMY     BREAST SURGERY     augmentation   CHOLECYSTECTOMY     DILATION AND EVACUATION N/A 02/07/2019   Procedure: DILATATION AND EVACUATION;  Surgeon: Catalina Antigua, MD;  Location: [facility];  Service: Gynecology;  Laterality: N/A;   EYE SURGERY Bilateral    closed tear ducts opened as child   TONSILLECTOMY      Family History  Problem Relation Age of Onset   Heart attack Father    Diabetes Paternal Grandfather    Hypertension Maternal Grandmother    Heart attack Maternal Grandmother    Hypertension Maternal Grandfather    Heart attack Maternal Grandfather     Social History   Socioeconomic History   Marital status: Divorced    Spouse name: Not on file   Number of children: 1   Years of education: Not on file   Highest education level: Not on file  Occupational History   Occupation: unemployed  Tobacco Use   Smoking status: Former    Types: E-cigarettes   Smokeless tobacco: Never   Vaping Use   Vaping status: Every Day   Substances: Nicotine, CBD  Substance and Sexual Activity   Alcohol use: No   Drug use: No   Sexual activity: Yes    Birth control/protection: None  Other Topics Concern   Not on file  Social History Narrative   Not on file   Social Determinants of Health   Financial Resource Strain: Medium Risk (02/17/2022)   Received from [facility], [facility]   Overall Financial Resource Strain (CARDIA)    Difficulty of Paying Living Expenses: Somewhat hard  Food Insecurity: Food Insecurity Present (02/17/2022)   Received from [facility], [facility]   Hunger Vital Sign    Worried About Running Out of Food in the Last Year: Sometimes true    Ran Out of Food in the Last Year: Sometimes true  Transportation Needs: No Transportation Needs (02/17/2022)   Received from [employer], [facility]   PRAPARE - Transportation    Lack of Transportation (Medical): No    Lack of Transportation (Non-Medical): No  Physical Activity: Sufficiently Active (02/06/2021)   Received from [facility], [facility]   Exercise Vital Sign    Days of Exercise per Week: 5 days    Minutes of Exercise per Session: 120 min  Stress: Unknown (02/06/2021)   Received  from [facility], [facility]   [employer] of Occupational Health - Occupational Stress Questionnaire    Feeling of Stress : Patient declined  Social Connections: Unknown (05/26/2021)   Received from [facility], [facility]   Social Network    Social Network: Not on file  Intimate Partner Violence: Not At Risk (11/28/2022)   Received from [facility]   HITS    Over the last 12 months how often did your partner physically hurt you?: Never    Over the last 12 months how often did your partner insult you or talk down to you?: Never    Over the last 12 months how often did your partner threaten you with physical harm?: Never    Over the last 12 months how often did your partner scream or  curse at you?: Never     Physical Exam   Vitals:   01/05/23 0315 01/05/23 0345  BP: (!) 97/53 (!) 90/59  Pulse: 74 79  Resp:  16  Temp:    SpO2: 96% 100%    CONSTITUTIONAL: Well-appearing, NAD NEURO/PSYCH:  Alert and oriented x 3, no focal deficits EYES:  eyes equal and reactive ENT/NECK:  no LAD, no JVD CARDIO: Regular rate, well-perfused, normal S1 and S2 PULM:  CTAB no wheezing or rhonchi GI/GU:  non-distended, moderate left upper quadrant tenderness to palpation MSK/SPINE:  No gross deformities, no edema SKIN:  no rash, atraumatic   *Additional and/or pertinent findings included in MDM below  Diagnostic and Interventional Summary    EKG Interpretation Date/Time:  Tuesday January 04 2023 20:34:50 EST Ventricular Rate:  100 PR Interval:  114 QRS Duration:  100 QT Interval:  312 QTC Calculation: 402 R Axis:   79  Text Interpretation: Normal sinus rhythm Incomplete right bundle branch block Borderline ECG When compared with ECG of 11-Apr-2019 08:46, PREVIOUS ECG IS PRESENT Confirmed by Kennis Carina [phone]) on 01/04/2023 11:03:07 PM       Labs Reviewed  COMPREHENSIVE METABOLIC PANEL - Abnormal; Notable for the following components:      Result Value   Glucose, Bld 100 (*)    AST 12 (*)    All other components within normal limits  CBC - Abnormal; Notable for the following components:   WBC 14.1 (*)    Platelets 406 (*)    All other components within normal limits  URINALYSIS, ROUTINE W REFLEX MICROSCOPIC - Abnormal; Notable for the following components:   Color, Urine COLORLESS (*)    All other components within normal limits  LIPASE, BLOOD  PREGNANCY, URINE  TROPONIN I (HIGH SENSITIVITY)  TROPONIN I (HIGH SENSITIVITY)    CT ABDOMEN PELVIS W CONTRAST  Final Result    DG Chest Port 1 View  Final Result      Medications  sodium chloride 0.9 % bolus 1,000 mL (0 mLs Intravenous Stopped 01/05/23 0329)  ondansetron (ZOFRAN) injection 4 mg (4 mg  Intravenous Given 01/05/23 0128)  morphine (PF) 4 MG/ML injection 4 mg (4 mg Intravenous Given 01/05/23 0128)  famotidine (PEPCID) IVPB 20 mg premix (0 mg Intravenous Stopped 01/05/23 0223)  iohexol (OMNIPAQUE) 300 MG/ML solution 100 mL (85 mLs Intravenous Contrast Given 01/05/23 0233)  HYDROmorphone (DILAUDID) injection 1 mg (1 mg Intravenous Given 01/05/23 0221)  haloperidol lactate (HALDOL) injection 2 mg (2 mg Intravenous Given 01/05/23 0344)  pantoprazole (PROTONIX) injection 40 mg (40 mg Intravenous Given 01/05/23 0346)     Procedures  /  Critical Care Procedures  ED Course and  Medical Decision Making  Initial Impression and Ddx Differential diagnosis includes gastroenteritis, splenic infarct, gastric ulcer, perforated viscus given the amount of tenderness on exam.  Plan is for advanced imaging.  Radiation of the chest likely related to acid reflux but will evaluate with chest x-ray, troponin.  No shortness of breath, no tachycardia, no evidence of DVT, highly doubt PE.  Past medical/surgical history that increases complexity of ED encounter: None  Interpretation of Diagnostics I personally reviewed the EKG and my interpretation is as follows: Sinus rhythm  Labs reassuring with no significant blood count or electrolyte disturbance.  CT is unremarkable.  Patient Reassessment and Ultimate Disposition/Management     Patient was not having much response with Zofran or Dilaudid.  Trial of haloperidol IV was very successful, resolution of symptoms.  Question functional abdominal pain versus cannabis hyperemesis versus cyclical vomiting.  Feels comfortable going home with follow-up, medicines at home.  Patient management required discussion with the following services or consulting groups:  None  Complexity of Problems Addressed Acute illness or injury that poses threat of life of bodily function  Additional Data Reviewed and Analyzed Further history obtained from: Further history  from spouse/family member  Additional Factors Impacting ED Encounter Risk Prescriptions, Use of parenteral controlled substances, and Consideration of hospitalization  Elmer Sow. Pilar Plate, MD [facility] Emergency Medicine [facility] [email or web address]  Final Clinical Impressions(s) / ED Diagnoses     ICD-10-CM   1. Left upper quadrant abdominal pain  R10.12       ED Discharge Orders          Ordered    sucralfate (CARAFATE) 1 g tablet  4 times daily PRN        01/05/23 0509    famotidine (PEPCID) 20 MG tablet  2 times daily        01/05/23 0509    promethazine (PHENERGAN) 25 MG tablet  Every 6 hours PRN        01/05/23 0509             Discharge Instructions Discussed with and Provided to Patient:     Discharge Instructions      You were evaluated in the Emergency Department and after careful evaluation, we did not find any emergent condition requiring admission or further testing in the hospital.  Your exam/testing today is overall reassuring.  Use the Carafate as needed for abdominal pain.  Recommend taking the Pepcid twice daily for preventative measures.  Use the Phenergan as needed for nausea.  Follow-up with a gastroenterologist.  Please return to the Emergency Department if you experience any worsening of your condition.   Thank you for allowing [place] to be a part of your care.       Sabas Sous, MD 01/05/23 [id number]

## 2023-01-12 ENCOUNTER — Encounter: Payer: Self-pay | Admitting: Family

## 2023-01-12 ENCOUNTER — Ambulatory Visit: Payer: Medicare Other | Admitting: Family

## 2023-01-12 NOTE — Progress Notes (Deleted)
Patient ID: Jody Carter, female    DOB: Jun 05, 1995, 27 y.o.   MRN: [id number]  Chief Complaint  Patient presents with  . New Patient (Initial Visit)  . Abdominal Pain    Pt c/o Abdominal pain, N/V, belching. Pt states she has been vomiting blood, Present for 4 years. Has tried sulcrafate which does help slightly.Pt states her food is not digesting.   . Joint Swelling    Pt c/o joint pain/swelling in bilateral ankles, fatigue, muscle tension and weakness. Present off and on for 4 years. Pt states she did see a neurologist which dx her fibromyalgia.        Bipolar, Seizures, Fibromyalgia, Migraines, Hypothyroid - has GI - digestive health seen in 02/2022 -dx w/IBS in 2022 -hx of bentyl, did not work -   Assessment & Plan:   Subjective:    Outpatient Medications Prior to Visit  Medication Sig Dispense Refill  . albuterol (VENTOLIN HFA) 108 (90 Base) MCG/ACT inhaler Inhale 2 puffs into the lungs.    . famotidine (PEPCID) 20 MG tablet Take 1 tablet (20 mg total) by mouth 2 (two) times daily. 30 tablet 0  . levETIRAcetam (KEPPRA) 100 MG/ML solution Take by mouth 2 (two) times daily.    Marland Kitchen levothyroxine (SYNTHROID, LEVOTHROID) 88 MCG tablet Take 1 tablet (88 mcg total) by mouth daily before breakfast. For hypothyroidism 30 tablet 0  . ondansetron (ZOFRAN-ODT) 4 MG disintegrating tablet Take 1 tablet (4 mg total) by mouth every 8 (eight) hours as needed for nausea or vomiting. 12 tablet 0  . promethazine (PHENERGAN) 25 MG tablet Take 1 tablet (25 mg total) by mouth every 6 (six) hours as needed for nausea or vomiting. 30 tablet 0  . sucralfate (CARAFATE) 1 g tablet Take 1 tablet (1 g total) by mouth 4 (four) times daily as needed. 30 tablet 0  . topiramate (TOPAMAX) 50 MG tablet Take by mouth.    . Vitamin D, Ergocalciferol, (DRISDOL) 1.25 MG (50000 UNIT) CAPS capsule Take 50,000 Units by mouth every 7 (seven) days.     No facility-administered medications prior to visit.   Past Medical  History:  Diagnosis Date  . Anxiety   . Depression   . Fibromyalgia 2022  . Hx of migraines   . IBS (irritable bowel syndrome)   . Panic disorder   . Seizures (HCC)   . Thyroid disease    Past Surgical History:  Procedure Laterality Date  . ADENOIDECTOMY    . BREAST SURGERY     augmentation  . CHOLECYSTECTOMY    . DILATION AND EVACUATION N/A 02/07/2019   Procedure: DILATATION AND EVACUATION;  Surgeon: Catalina Antigua, MD;  Location: [facility];  Service: Gynecology;  Laterality: N/A;  . EYE SURGERY Bilateral    closed tear ducts opened as child  . TONSILLECTOMY     Allergies  Allergen Reactions  . Sumatriptan Other (See Comments)    Neck and chest pressure, parasthesias      Objective:    Physical Exam BP 109/75 (BP Location: Left Arm, Patient Position: Sitting, Cuff Size: Normal)   Pulse 88   Temp 98 F (36.7 C) (Temporal)   Ht 5\' 3"  (1.6 m)   Wt 118 lb 8 oz (53.8 kg)   LMP 12/18/2022 (Exact Date)   SpO2 96%   Breastfeeding No   BMI 20.99 kg/m  Wt Readings from Last 3 Encounters:  01/12/23 118 lb 8 oz (53.8 kg)  04/08/22 124 lb (56.2 kg)  04/16/21 135 lb (61.2 kg)       Dulce Sellar, NP

## 2023-01-17 ENCOUNTER — Telehealth: Payer: Self-pay | Admitting: Family

## 2023-01-17 NOTE — Telephone Encounter (Signed)
Ok to schedule a New Patient appt with Dr Jimmey Ralph

## 2023-01-17 NOTE — Telephone Encounter (Signed)
Copied from CRM [id number]. Topic: Appointments - Scheduling Inquiry for Clinic >> Jan 17, 2023  1:51 PM Gaetano Hawthorne wrote: Reason for CRM: Patient's spouse (Italy Malloy - 11/14/1977) is a patient of Dr. Jacquiline Doe - he would like to know if it were possible for him to take on his wife as a new patient.

## 2023-01-20 ENCOUNTER — Ambulatory Visit: Payer: Medicare Other | Admitting: Family

## 2023-01-22 NOTE — Progress Notes (Signed)
pt left w/o being seen.

## 2023-02-21 ENCOUNTER — Ambulatory Visit: Payer: Medicare Other | Admitting: Family Medicine

## 2023-02-22 ENCOUNTER — Encounter: Payer: Self-pay | Admitting: Family Medicine

## 2023-02-22 ENCOUNTER — Ambulatory Visit: Payer: Medicare Other | Admitting: Family Medicine

## 2023-02-22 VITALS — BP 106/70 | HR 112 | Temp 99.5°F | Ht 63.0 in | Wt 124.0 lb

## 2023-02-22 DIAGNOSIS — M47812 Spondylosis without myelopathy or radiculopathy, cervical region: Secondary | ICD-10-CM

## 2023-02-22 DIAGNOSIS — E039 Hypothyroidism, unspecified: Secondary | ICD-10-CM

## 2023-02-22 DIAGNOSIS — G43809 Other migraine, not intractable, without status migrainosus: Secondary | ICD-10-CM

## 2023-02-22 DIAGNOSIS — K589 Irritable bowel syndrome without diarrhea: Secondary | ICD-10-CM | POA: Diagnosis not present

## 2023-02-22 DIAGNOSIS — L659 Nonscarring hair loss, unspecified: Secondary | ICD-10-CM

## 2023-02-22 DIAGNOSIS — L709 Acne, unspecified: Secondary | ICD-10-CM

## 2023-02-22 DIAGNOSIS — R768 Other specified abnormal immunological findings in serum: Secondary | ICD-10-CM

## 2023-02-22 DIAGNOSIS — M329 Systemic lupus erythematosus, unspecified: Secondary | ICD-10-CM | POA: Insufficient documentation

## 2023-02-22 DIAGNOSIS — R569 Unspecified convulsions: Secondary | ICD-10-CM

## 2023-02-22 DIAGNOSIS — K219 Gastro-esophageal reflux disease without esophagitis: Secondary | ICD-10-CM

## 2023-02-22 MED ORDER — LINACLOTIDE 290 MCG PO CAPS: 290.0000 ug | ORAL_CAPSULE | Freq: Every day | ORAL | 3 refills | Status: DC

## 2023-02-22 NOTE — Patient Instructions (Signed)
It was very nice to see you today!  I will refer you to see the neurologist, sports medicine physician, and rheumatologist.  I will refill your Synthroid.  Return in about 6 weeks (around 04/05/2023).   Take care, Dr Jimmey Ralph  PLEASE NOTE:  If you had any lab tests, please let us know if you have not heard back within a few days. You may see your results on mychart before we have a chance to review them but we will give you a call once they are reviewed by [place].   If we ordered any referrals today, please let us know if you have not heard from their office within the next week.   If you had any urgent prescriptions sent in today, please check with the pharmacy within an hour of our visit to make sure the prescription was transmitted appropriately.   Please try these tips to maintain a healthy lifestyle:  Eat at least 3 REAL meals and 1-2 snacks per day.  Aim for no more than 5 hours between eating.  If you eat breakfast, please do so within one hour of getting up.   Each meal should contain half fruits/vegetables, one quarter protein, and one quarter carbs (no bigger than a computer mouse)  Cut down on sweet beverages. This includes juice, soda, and sweet tea.   Drink at least 1 glass of water with each meal and aim for at least 8 glasses per day  Exercise at least 150 minutes every week.

## 2023-02-22 NOTE — Assessment & Plan Note (Signed)
Stable on omeprazole and Pepcid per GI.

## 2023-02-22 NOTE — Assessment & Plan Note (Signed)
Her neurologist checked ANA last year which was positive with a 1-80 titer ratio.  She does have several symptoms consistent with potential underlying lupus diagnosis as well including polyarthralgia and rash.  Her previous neurologist apparently referred her to see a rheumatologist with the new updated labs however she has not heard back about seeing one.  She is agreeable for [place] to place referral today.

## 2023-02-22 NOTE — Progress Notes (Signed)
Jody Carter is a 28 y.o. female who presents today for an office visit.  She is a new patient.   Assessment/Plan:  Chronic Problems Addressed Today: ANA positive Her neurologist checked ANA last year which was positive with a 1-80 titer ratio.  She does have several symptoms consistent with potential underlying lupus diagnosis as well including polyarthralgia and rash.  Her previous neurologist apparently referred her to see a rheumatologist with the new updated labs however she has not heard back about seeing one.  She is agreeable for [place] to place referral today.  Cervical spondylosis MRI last year showed cervical spondylosis with osteophyte formation.  Her neurologist was supposed to refer her to see physical therapy however this has not yet been done.  This is likely the source of her right upper extremity spondylosis.  Will place referral to sports medicine for further evaluation and management.  Seizures (HCC) Overall stable on Keppra 100 mg twice daily.  Will place referral to establish with neurology within [facility] health.  Migraine Stable on Keppra.  IBS (irritable bowel syndrome) She is following with GI for this.  Right now symptoms are fairly manageable.  We will refill her Linzess today.  She continue management per gastroenterology otherwise.   Hypothyroidism Last TSH was at goal on Synthroid 88 mcg daily.  Will refill today.  We can recheck labs when she comes back for follow-up visit.  Alopecia On spironolactone and ketoconazole per dermatology.  Acne On spironolactone and clindamycin per dermatology.  GERD (gastroesophageal reflux disease) Stable on omeprazole and Pepcid per GI.     Subjective:  HPI:  Patient here as a new patient to establish care.  See A/P for status of her chronic conditions.  Her past medical history is significant for hypothyroidism, acne, alopecia, recurrent emesis, chronic abdominal pain, GERD, IBS with constipation, migraines,  seizure, and polyarthralgia.  Her hypothyroidism has been well-controlled on Synthroid 88 mcg daily for several years.  She is currently seeing a dermatologist for acne and alopecia.  She is on ketoconazole, spironolactone, and clindamycin for this.  She is tolerating this well.  She has also been following with GI for chronic abdominal pain.  Her abdominal pain has been going on for many years.  She has seen GI for this in the past as well.  She has had extensive workup including EGD and numerous CT scans.  Most recently, she saw GI almost a year ago.  At that time the suspicion that most of her symptoms were related to IBS type C.  She was continued on omeprazole 40 mg daily, amitriptyline 25 mg daily, Linzess 290 mcg daily, and MiraLAX 1-2 times per day.  At that time they ordered a CT abdomen pelvis which demonstrated fluid-filled loops of distal small bowel and gas/fluid levels throughout the colon.  Also found to have dilated biliary duct.  She was then referred back to gastroenterology urgently and abdominal MRI was ordered.  This showed stable dilation compared to her CT scan.  Overall symptoms are currently stable on current regimen Linzess 290 mcg daily, omeprazole 40 mg daily, and famotidine 20 mg twice daily.  Her primary concern today is ongoing history with polyarthralgia, paresthesias, headaches, and brain fog.  She has been seeing a neurologist in [place] for this for the last 4 years.  She was told that she has either lupus or MS.  She did have ANA testing which was positive but has not been referred to rheumatology since this test  was completed.  She did previously rheumatologist a few years ago and was told that she had fibromyalgia.  Symptoms are still happening intermittently.  She also has been diagnosed with seizure disorder.  She was previously on Topamax however had to discontinue due to hair loss.  She is now on Keppra.  She is tolerating this well.  She has been diagnosed with  migraine disorder as well however this is currently well-controlled on Keppra.  Her neurologist recently ordered a cervical spine MRI due to paresthesias that she is having in her right upper extremity.  This did show a osteophyte with minimal canal stenosis and foraminal narrowing however her neurologist did not recommend any further workup at this point.  She is still having ongoing issues with numbness and tingling as well as significant pain in her neck.  ROS: Per HPI, otherwise a complete review of systems was negative.   PMH:  The following were reviewed and entered/updated in epic: Past Medical History:  Diagnosis Date   Anxiety    Depression    Encounter for supervision of normal pregnancy, antepartum 05/27/2021                 FAMILY TREE         RESULTS      Language    English    Pap           Initiated care at         GC/CT    Initial:            36wks:      Dating by    8wk [place]                Support person         Genetics    NT/IT:     AFP:      Panorama:       BP cuff         Carrier Screen                     [place]/Hgb Elec           Rhogam                     TDaP vaccine         Blood Type              Fibromyalgia 2022   Hx of migraines    IBS (irritable bowel syndrome)    Panic disorder    Seizures (HCC)    Susceptible to varicella (non-immune), currently pregnant 07/03/2013   Thyroid disease    Patient Active Problem List   Diagnosis Date Noted   Cervical spondylosis 02/22/2023   ANA positive 02/22/2023   IBS (irritable bowel syndrome) 02/22/2023   Hypothyroidism 02/22/2023   Alopecia 02/22/2023   Acne 02/22/2023   GERD (gastroesophageal reflux disease) 02/22/2023   Seizures (HCC) 10/28/2019   Migraine 07/31/2012   Past Surgical History:  Procedure Laterality Date   ADENOIDECTOMY     BREAST SURGERY     augmentation   CHOLECYSTECTOMY     DILATION AND EVACUATION N/A 02/07/2019   Procedure: DILATATION AND EVACUATION;  Surgeon: Catalina Antigua, MD;  Location:  [facility];  Service: Gynecology;  Laterality: N/A;   EYE SURGERY Bilateral    closed tear ducts opened as child   TONSILLECTOMY      Family History  Problem Relation Age  of Onset   Hypertension Mother    Heart attack Father    Drug abuse Father    Hypertension Maternal Grandmother    Heart attack Maternal Grandmother    Hypertension Maternal Grandfather    Heart attack Maternal Grandfather    Diabetes Paternal Grandfather     Medications- reviewed and updated Current Outpatient Medications  Medication Sig Dispense Refill   albuterol (VENTOLIN HFA) 108 (90 Base) MCG/ACT inhaler Inhale 2 puffs into the lungs.     famotidine (PEPCID) 20 MG tablet Take 1 tablet (20 mg total) by mouth 2 (two) times daily. 30 tablet 0   fluocinonide (LIDEX) 0.05 % external solution SMARTSIG:10-15 Drop(s) Topical Every Night     ketoconazole (NIZORAL) 2 % shampoo SMARTSIG:Topical 4 Times a Week     levETIRAcetam (KEPPRA) 100 MG/ML solution Take by mouth 2 (two) times daily.     levothyroxine (SYNTHROID, LEVOTHROID) 88 MCG tablet Take 1 tablet (88 mcg total) by mouth daily before breakfast. For hypothyroidism 30 tablet 0   linaclotide (LINZESS) 290 MCG CAPS capsule Take 1 capsule (290 mcg total) by mouth daily before breakfast. 90 capsule 3   omeprazole (PRILOSEC) 40 MG capsule Take 40 mg by mouth daily.     ondansetron (ZOFRAN-ODT) 4 MG disintegrating tablet Take 1 tablet (4 mg total) by mouth every 8 (eight) hours as needed for nausea or vomiting. 12 tablet 0   promethazine (PHENERGAN) 25 MG tablet Take 1 tablet (25 mg total) by mouth every 6 (six) hours as needed for nausea or vomiting. 30 tablet 0   spironolactone (ALDACTONE) 50 MG tablet Take by mouth.     sucralfate (CARAFATE) 1 g tablet Take 1 tablet (1 g total) by mouth 4 (four) times daily as needed. 30 tablet 0   Vitamin D, Ergocalciferol, (DRISDOL) 1.25 MG (50000 UNIT) CAPS capsule Take 50,000 Units by mouth every 7 (seven) days.      No current facility-administered medications for this visit.    Allergies-reviewed and updated Allergies  Allergen Reactions   Sumatriptan Other (See Comments)    Neck and chest pressure, parasthesias    Social History   Socioeconomic History   Marital status: Divorced    Spouse name: Not on file   Number of children: 1   Years of education: Not on file   Highest education level: Not on file  Occupational History   Occupation: unemployed  Tobacco Use   Smoking status: Former    Types: E-cigarettes   Smokeless tobacco: Never  Vaping Use   Vaping status: Every Day   Substances: Nicotine, CBD  Substance and Sexual Activity   Alcohol use: No   Drug use: No   Sexual activity: Yes    Birth control/protection: None  Other Topics Concern   Not on file  Social History Narrative   Not on file   Social Drivers of Health   Financial Resource Strain: Medium Risk (02/17/2022)   Received from [facility], [facility]   Overall Financial Resource Strain (CARDIA)    Difficulty of Paying Living Expenses: Somewhat hard  Food Insecurity: Food Insecurity Present (02/17/2022)   Received from [facility], [facility]   Hunger Vital Sign    Worried About Running Out of Food in the Last Year: Sometimes true    Ran Out of Food in the Last Year: Sometimes true  Transportation Needs: No Transportation Needs (02/17/2022)   Received from [facility], [facility]   [facility] - Transportation    Lack  of Transportation (Medical): No    Lack of Transportation (Non-Medical): No  Physical Activity: Sufficiently Active (02/06/2021)   Received from [facility], [facility]   Exercise Vital Sign    Days of Exercise per Week: 5 days    Minutes of Exercise per Session: 120 min  Stress: Unknown (02/06/2021)   Received from [facility], [facility] - Occupational Stress Questionnaire    Feeling of Stress : Patient declined  Social  Connections: Unknown (05/26/2021)   Received from [facility], [facility]   Social Network    Social Network: Not on file         Objective:  Physical Exam: BP 106/70   Pulse (!) 112   Temp 99.5 F (37.5 C) (Temporal)   Ht 5\' 3"  (1.6 m)   Wt 124 lb (56.2 kg)   LMP 02/05/2023   SpO2 98%   BMI 21.97 kg/m   Gen: No acute distress, resting comfortably CV: Regular rate and rhythm with no murmurs appreciated Pulm: Normal work of breathing, clear to auscultation bilaterally with no crackles, wheezes, or rhonchi Neuro: Grossly normal, moves all extremities Psych: Normal affect and thought content  Time Spent: 65 minutes of total time was spent on the date of the encounter performing the following actions: chart review prior to seeing the patient including recent visits with previous PCP and specialists obtaining history, performing a medically necessary exam, counseling on the treatment plan, placing orders, and documenting in our EHR.        Katina Degree. Jimmey Ralph, MD 02/22/2023 12:13 PM

## 2023-02-22 NOTE — Assessment & Plan Note (Signed)
Last TSH was at goal on Synthroid 88 mcg daily.  Will refill today.  We can recheck labs when she comes back for follow-up visit.

## 2023-02-22 NOTE — Assessment & Plan Note (Signed)
MRI last year showed cervical spondylosis with osteophyte formation.  Her neurologist was supposed to refer her to see physical therapy however this has not yet been done.  This is likely the source of her right upper extremity spondylosis.  Will place referral to sports medicine for further evaluation and management.

## 2023-02-22 NOTE — Assessment & Plan Note (Signed)
On spironolactone and clindamycin per dermatology.

## 2023-02-22 NOTE — Assessment & Plan Note (Signed)
Overall stable on Keppra 100 mg twice daily.  Will place referral to establish with neurology within [facility] health.

## 2023-02-22 NOTE — Assessment & Plan Note (Signed)
On spironolactone and ketoconazole per dermatology.

## 2023-02-22 NOTE — Assessment & Plan Note (Signed)
Stable on Keppra

## 2023-02-22 NOTE — Assessment & Plan Note (Signed)
She is following with GI for this.  Right now symptoms are fairly manageable.  We will refill her Linzess today.  She continue management per gastroenterology otherwise.

## 2023-02-25 ENCOUNTER — Other Ambulatory Visit: Payer: Self-pay

## 2023-02-25 ENCOUNTER — Encounter: Payer: Self-pay | Admitting: Family Medicine

## 2023-02-25 DIAGNOSIS — R569 Unspecified convulsions: Secondary | ICD-10-CM

## 2023-02-25 DIAGNOSIS — M47812 Spondylosis without myelopathy or radiculopathy, cervical region: Secondary | ICD-10-CM

## 2023-02-25 DIAGNOSIS — G43809 Other migraine, not intractable, without status migrainosus: Secondary | ICD-10-CM

## 2023-02-25 NOTE — Telephone Encounter (Signed)
Pt scheduled with Sports Med on Monday and Gwyn in referrals is routing new Neuro referral to [facility] per request

## 2023-02-25 NOTE — Progress Notes (Unsigned)
    Aleen Sells D.K[facility] [address] [place] [address] Phone: [phone]   Assessment and Plan:     There are no diagnoses linked to this encounter.  ***   Pertinent previous records reviewed include ***    Follow Up: ***     Subjective:   I, Darlyn Repsher, am serving as a Neurosurgeon for Doctor Richardean Sale  Chief Complaint: neck pain   HPI:   02/28/2023 Patient is a 28 year old female with neck pain. Patient states   Relevant Historical Information: ***  Additional pertinent review of systems negative.   Current Outpatient Medications:    albuterol (VENTOLIN HFA) 108 (90 Base) MCG/ACT inhaler, Inhale 2 puffs into the lungs., Disp: , Rfl:    famotidine (PEPCID) 20 MG tablet, Take 1 tablet (20 mg total) by mouth 2 (two) times daily., Disp: 30 tablet, Rfl: 0   fluocinonide (LIDEX) 0.05 % external solution, SMARTSIG:10-15 Drop(s) Topical Every Night, Disp: , Rfl:    ketoconazole (NIZORAL) 2 % shampoo, SMARTSIG:Topical 4 Times a Week, Disp: , Rfl:    levETIRAcetam (KEPPRA) 100 MG/ML solution, Take by mouth 2 (two) times daily., Disp: , Rfl:    levothyroxine (SYNTHROID, LEVOTHROID) 88 MCG tablet, Take 1 tablet (88 mcg total) by mouth daily before breakfast. For hypothyroidism, Disp: 30 tablet, Rfl: 0   linaclotide (LINZESS) 290 MCG CAPS capsule, Take 1 capsule (290 mcg total) by mouth daily before breakfast., Disp: 90 capsule, Rfl: 3   omeprazole (PRILOSEC) 40 MG capsule, Take 40 mg by mouth daily., Disp: , Rfl:    ondansetron (ZOFRAN-ODT) 4 MG disintegrating tablet, Take 1 tablet (4 mg total) by mouth every 8 (eight) hours as needed for nausea or vomiting., Disp: 12 tablet, Rfl: 0   promethazine (PHENERGAN) 25 MG tablet, Take 1 tablet (25 mg total) by mouth every 6 (six) hours as needed for nausea or vomiting., Disp: 30 tablet, Rfl: 0   spironolactone (ALDACTONE) 50 MG tablet, Take by mouth., Disp: , Rfl:    sucralfate  (CARAFATE) 1 g tablet, Take 1 tablet (1 g total) by mouth 4 (four) times daily as needed., Disp: 30 tablet, Rfl: 0   Vitamin D, Ergocalciferol, (DRISDOL) 1.25 MG (50000 UNIT) CAPS capsule, Take 50,000 Units by mouth every 7 (seven) days., Disp: , Rfl:    Objective:     There were no vitals filed for this visit.    There is no height or weight on file to calculate BMI.    Physical Exam:    ***   Electronically signed by:  Aleen Sells D.[facility] 3:19 PM 02/25/23

## 2023-02-25 NOTE — Telephone Encounter (Signed)
Please place new referral to [facility] neurology.  Can we see what happened with the sportsmed referral?

## 2023-02-25 NOTE — Telephone Encounter (Signed)
Both referrals that were placed has been closed for this patient. Sent inbasket message to referral coordinator on Neuro referral; per office suggests pt see [facility] or other office due to complex neurological issues.

## 2023-02-25 NOTE — Telephone Encounter (Signed)
Referral placed to [facility] and reached out to Tammy on Sports Med referral with Referral coordinator not online today

## 2023-02-28 ENCOUNTER — Ambulatory Visit (INDEPENDENT_AMBULATORY_CARE_PROVIDER_SITE_OTHER): Payer: Medicare Other | Admitting: Sports Medicine

## 2023-02-28 ENCOUNTER — Ambulatory Visit (INDEPENDENT_AMBULATORY_CARE_PROVIDER_SITE_OTHER)
Admission: RE | Admit: 2023-02-28 | Discharge: 2023-02-28 | Disposition: A | Discharge status: Home / Self Care | Payer: Medicare Other | Source: Ambulatory Visit | Attending: Sports Medicine | Admitting: Sports Medicine

## 2023-02-28 VITALS — BP 112/60 | HR 111 | Ht 63.0 in | Wt 120.0 lb

## 2023-02-28 DIAGNOSIS — M2550 Pain in unspecified joint: Secondary | ICD-10-CM | POA: Diagnosis not present

## 2023-02-28 DIAGNOSIS — M542 Cervicalgia: Secondary | ICD-10-CM

## 2023-02-28 DIAGNOSIS — R202 Paresthesia of skin: Secondary | ICD-10-CM

## 2023-02-28 DIAGNOSIS — R200 Anesthesia of skin: Secondary | ICD-10-CM

## 2023-02-28 DIAGNOSIS — E559 Vitamin D deficiency, unspecified: Secondary | ICD-10-CM

## 2023-02-28 DIAGNOSIS — R4189 Other symptoms and signs involving cognitive functions and awareness: Secondary | ICD-10-CM

## 2023-02-28 DIAGNOSIS — F8081 Childhood onset fluency disorder: Secondary | ICD-10-CM

## 2023-02-28 DIAGNOSIS — R413 Other amnesia: Secondary | ICD-10-CM

## 2023-02-28 LAB — CBC WITH DIFFERENTIAL/PLATELET
Basophils Absolute: 0 10*3/uL (ref 0.0–0.1)
Basophils Relative: 0.2 % (ref 0.0–3.0)
Eosinophils Absolute: 0 10*3/uL (ref 0.0–0.7)
Eosinophils Relative: 0.3 % (ref 0.0–5.0)
HCT: 36.3 % (ref 36.0–46.0)
Hemoglobin: 12 g/dL (ref 12.0–15.0)
Lymphocytes Relative: 10.3 % — ABNORMAL LOW (ref 12.0–46.0)
Lymphs Abs: 1.7 10*3/uL (ref 0.7–4.0)
MCHC: 33 g/dL (ref 30.0–36.0)
MCV: 95.9 fL (ref 78.0–100.0)
Monocytes Absolute: 0.9 10*3/uL (ref 0.1–1.0)
Monocytes Relative: 5.7 % (ref 3.0–12.0)
Neutro Abs: 13.6 10*3/uL — ABNORMAL HIGH (ref 1.4–7.7)
Neutrophils Relative %: 83.5 % — ABNORMAL HIGH (ref 43.0–77.0)
Platelets: 330 10*3/uL (ref 150.0–400.0)
RBC: 3.78 Mil/uL — ABNORMAL LOW (ref 3.87–5.11)
RDW: 13.2 % (ref 11.5–15.5)
WBC: 16.3 10*3/uL — ABNORMAL HIGH (ref 4.0–10.5)

## 2023-02-28 LAB — COMPREHENSIVE METABOLIC PANEL
ALT: 9 U/L (ref 0–35)
AST: 15 U/L (ref 0–37)
Albumin: 3.9 g/dL (ref 3.5–5.2)
Alkaline Phosphatase: 54 U/L (ref 39–117)
BUN: 12 mg/dL (ref 6–23)
CO2: 23 meq/L (ref 19–32)
Calcium: 8.3 mg/dL — ABNORMAL LOW (ref 8.4–10.5)
Chloride: 106 meq/L (ref 96–112)
Creatinine, Ser: 0.54 mg/dL (ref 0.40–1.20)
GFR: 127.05 mL/min (ref >60.00)
Glucose, Bld: 96 mg/dL (ref 70–99)
Potassium: 3.5 meq/L (ref 3.5–5.1)
Sodium: 135 meq/L (ref 135–145)
Total Bilirubin: 0.5 mg/dL (ref 0.2–1.2)
Total Protein: 6 g/dL (ref 6.0–8.3)

## 2023-02-28 LAB — SEDIMENTATION RATE: Sed Rate: 19 mm/h (ref 0–20)

## 2023-02-28 LAB — FERRITIN: Ferritin: 51.2 ng/mL (ref 10.0–291.0)

## 2023-02-28 LAB — TSH: TSH: 1.44 u[IU]/mL (ref 0.35–5.50)

## 2023-02-28 LAB — URIC ACID: Uric Acid, Serum: 3.5 mg/dL (ref 2.4–7.0)

## 2023-02-28 LAB — C-REACTIVE PROTEIN: CRP: 5.8 mg/dL (ref 0.5–20.0)

## 2023-02-28 LAB — VITAMIN B12: Vitamin B-12: 404 pg/mL (ref 211–911)

## 2023-02-28 LAB — VITAMIN D 25 HYDROXY (VIT D DEFICIENCY, FRACTURES): VITD: 28.13 ng/mL — ABNORMAL LOW (ref 30.00–100.00)

## 2023-02-28 MED ORDER — MELOXICAM 15 MG PO TABS: 15.0000 mg | ORAL_TABLET | Freq: Every day | ORAL | 0 refills | Status: AC

## 2023-02-28 NOTE — Patient Instructions (Addendum)
Good to see you  - Start meloxicam 15 mg daily x2 weeks.  If still having pain after 2 weeks, complete 3rd-week of NSAID. May use remaining NSAID as needed once daily for pain control.  Do not to use additional over-the-counter NSAIDs (ibuprofen, naproxen, Advil, Aleve) while taking prescription NSAIDs.  May use Tylenol [phone] mg 2 to 3 times a day for breakthrough pain. Referral for Neck and trap placed Exercises given for neck and trap  Lab work on the way out  Can sign a records release upfront so we can get the MRI imaging  Follow up in 4  Weeks

## 2023-03-02 ENCOUNTER — Telehealth: Payer: Self-pay | Admitting: Family Medicine

## 2023-03-02 LAB — RHEUMATOID FACTOR: Rheumatoid fact SerPl-aCnc: 12 [IU]/mL (ref <14)

## 2023-03-02 LAB — CYCLIC CITRUL PEPTIDE ANTIBODY, IGG: Cyclic Citrullin Peptide Ab: <16 U

## 2023-03-02 NOTE — Telephone Encounter (Unsigned)
 Copied from CRM [id number]. Topic: Referral - Status >> Mar 02, 2023  8:52 AM Viola FALCON wrote: Reason for CRM: Dr kennyth referred patient to Neurology 02/25/23 - Per [facility] patient declined appointment due to location. The office is in [place], patient lives in [place].

## 2023-03-04 LAB — ANA+ENA+DNA/DS+SCL 70+SJOSSA/B
ANA Titer 1: POSITIVE — AB
ENA RNP Ab: <0.2 AI (ref 0.0–0.9)
ENA SM Ab Ser-aCnc: <0.2 AI (ref 0.0–0.9)
ENA SSA (RO) Ab: <0.2 AI (ref 0.0–0.9)
ENA SSB (LA) Ab: <0.2 AI (ref 0.0–0.9)
Scleroderma (Scl-70) (ENA) Antibody, IgG: <0.2 AI (ref 0.0–0.9)
dsDNA Ab: 1 [IU]/mL (ref 0–9)

## 2023-03-04 LAB — FANA STAINING PATTERNS: Speckled Pattern: 1:160 {titer} — ABNORMAL HIGH

## 2023-03-07 ENCOUNTER — Encounter: Payer: Self-pay | Admitting: Sports Medicine

## 2023-03-09 ENCOUNTER — Encounter: Payer: Self-pay | Admitting: Family Medicine

## 2023-03-09 NOTE — Telephone Encounter (Signed)
Ok to send referral to OBGYN?  Advise to call Dr Jean Rosenthal office for lab results

## 2023-03-10 NOTE — Telephone Encounter (Signed)
Ok with referral.  Katina Degree. Jimmey Ralph, MD 03/10/2023 12:54 PM

## 2023-03-10 NOTE — Addendum Note (Signed)
Addended by: Evon Slack on: 03/10/2023 08:03 AM   Modules accepted: Orders

## 2023-03-11 ENCOUNTER — Ambulatory Visit: Payer: Medicare Other | Admitting: Physical Therapy

## 2023-03-11 ENCOUNTER — Other Ambulatory Visit: Payer: Self-pay | Admitting: *Deleted

## 2023-03-11 DIAGNOSIS — Z124 Encounter for screening for malignant neoplasm of cervix: Secondary | ICD-10-CM

## 2023-03-11 NOTE — Telephone Encounter (Signed)
Referral placed.

## 2023-03-15 ENCOUNTER — Other Ambulatory Visit: Payer: Self-pay

## 2023-03-15 ENCOUNTER — Ambulatory Visit: Payer: Medicare Other | Attending: Sports Medicine | Admitting: Physical Therapy

## 2023-03-15 ENCOUNTER — Encounter: Payer: Self-pay | Admitting: Physical Therapy

## 2023-03-15 ENCOUNTER — Ambulatory Visit: Payer: Medicare Other | Admitting: Occupational Therapy

## 2023-03-15 DIAGNOSIS — R200 Anesthesia of skin: Secondary | ICD-10-CM | POA: Insufficient documentation

## 2023-03-15 DIAGNOSIS — R2681 Unsteadiness on feet: Secondary | ICD-10-CM | POA: Diagnosis present

## 2023-03-15 DIAGNOSIS — R208 Other disturbances of skin sensation: Secondary | ICD-10-CM | POA: Diagnosis present

## 2023-03-15 DIAGNOSIS — M62838 Other muscle spasm: Secondary | ICD-10-CM | POA: Insufficient documentation

## 2023-03-15 DIAGNOSIS — M542 Cervicalgia: Secondary | ICD-10-CM | POA: Diagnosis present

## 2023-03-15 DIAGNOSIS — M5412 Radiculopathy, cervical region: Secondary | ICD-10-CM | POA: Diagnosis present

## 2023-03-15 DIAGNOSIS — R29818 Other symptoms and signs involving the nervous system: Secondary | ICD-10-CM | POA: Diagnosis present

## 2023-03-15 DIAGNOSIS — R29898 Other symptoms and signs involving the musculoskeletal system: Secondary | ICD-10-CM | POA: Insufficient documentation

## 2023-03-15 DIAGNOSIS — R278 Other lack of coordination: Secondary | ICD-10-CM | POA: Insufficient documentation

## 2023-03-15 DIAGNOSIS — M6281 Muscle weakness (generalized): Secondary | ICD-10-CM

## 2023-03-15 DIAGNOSIS — R202 Paresthesia of skin: Secondary | ICD-10-CM | POA: Insufficient documentation
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
## 2023-03-16 ENCOUNTER — Telehealth: Payer: Self-pay | Admitting: Physical Therapy

## 2023-03-16 ENCOUNTER — Other Ambulatory Visit: Payer: Self-pay | Admitting: *Deleted

## 2023-03-16 DIAGNOSIS — F489 Nonpsychotic mental disorder, unspecified: Secondary | ICD-10-CM

## 2023-03-16 NOTE — Telephone Encounter (Signed)
 Referral placed.

## 2023-03-16 NOTE — Telephone Encounter (Signed)
 Dr. Jean Rosenthal, Susann Givens was evaluated by physical therapy on 03/15/2023.  The patient would benefit from speech therapy evaluation for word finding and concentration difficulties as well as reported development of a stutter.   If you agree, please place an order in [facility] workque in [facility] or fax the order to [phone]. Thank you, Camille Bal, PT, DPT  [facility] [address] [place], [place]  [address] Phone:  [phone] Fax:  [phone]

## 2023-03-16 NOTE — Telephone Encounter (Signed)
 Ok with me. Please place any necessary orders.

## 2023-03-16 NOTE — Telephone Encounter (Signed)
 Dr. Jimmey Ralph, Jody Carter was evaluated by physical therapy on 03/15/2023.  The patient would benefit from counseling or neuropsych evaluation for ongoing social support and mental health needs - she needs in-network resources to continue care/transition from prior therapist.   If you agree, please place an order in [facility] or reach out to the patient with options. Thank you, Camille Bal, PT, DPT  [facility] [address] [place], [place]  [address] Phone:  [phone] Fax:  [phone]

## 2023-03-17 ENCOUNTER — Other Ambulatory Visit: Payer: Self-pay | Admitting: Sports Medicine

## 2023-03-17 DIAGNOSIS — R200 Anesthesia of skin: Secondary | ICD-10-CM

## 2023-03-17 DIAGNOSIS — M542 Cervicalgia: Secondary | ICD-10-CM

## 2023-03-17 DIAGNOSIS — E559 Vitamin D deficiency, unspecified: Secondary | ICD-10-CM

## 2023-03-17 DIAGNOSIS — M2550 Pain in unspecified joint: Secondary | ICD-10-CM

## 2023-03-17 NOTE — Telephone Encounter (Signed)
 Referral placed.

## 2023-03-17 NOTE — Progress Notes (Unsigned)
 Speech referral

## 2023-03-18 NOTE — Addendum Note (Signed)
 Addended by: Evon Slack on: 03/18/2023 12:54 PM   Modules accepted: Orders

## 2023-03-24 ENCOUNTER — Ambulatory Visit: Payer: Medicare Other | Admitting: Occupational Therapy

## 2023-03-24 ENCOUNTER — Ambulatory Visit: Payer: Medicare Other | Admitting: Physical Therapy

## 2023-03-25 ENCOUNTER — Telehealth: Payer: Medicare Other | Admitting: Physician Assistant

## 2023-03-25 ENCOUNTER — Ambulatory Visit: Payer: Medicare Other | Admitting: Family Medicine

## 2023-03-25 DIAGNOSIS — H9201 Otalgia, right ear: Secondary | ICD-10-CM

## 2023-03-25 DIAGNOSIS — H9311 Tinnitus, right ear: Secondary | ICD-10-CM

## 2023-03-25 DIAGNOSIS — R609 Edema, unspecified: Secondary | ICD-10-CM | POA: Diagnosis not present

## 2023-03-25 MED ORDER — AMOXICILLIN-POT CLAVULANATE 875-125 MG PO TABS: 1.0000 | ORAL_TABLET | Freq: Two times a day (BID) | ORAL | 0 refills | Status: DC

## 2023-03-25 MED ORDER — FUROSEMIDE 20 MG PO TABS: 20.0000 mg | ORAL_TABLET | Freq: Every day | ORAL | 3 refills | Status: DC

## 2023-03-25 MED ORDER — PREDNISONE 20 MG PO TABS: 40.0000 mg | ORAL_TABLET | Freq: Every day | ORAL | 0 refills | Status: DC

## 2023-03-25 MED ORDER — FUROSEMIDE 20 MG PO TABS: 20.0000 mg | ORAL_TABLET | Freq: Every day | ORAL | 0 refills | Status: DC

## 2023-03-25 NOTE — Progress Notes (Signed)
 Virtual Visit Consent   Jody Carter, you are scheduled for a virtual visit with a [facility] provider today. Just as with appointments in the office, your consent must be obtained to participate. Your consent will be active for this visit and any virtual visit you may have with one of our providers in the next 365 days. If you have a MyChart account, a copy of this consent can be sent to you electronically.  As this is a virtual visit, video technology does not allow for your provider to perform a traditional examination. This may limit your provider's ability to fully assess your condition. If your provider identifies any concerns that need to be evaluated in person or the need to arrange testing (such as labs, EKG, etc.), we will make arrangements to do so. Although advances in technology are sophisticated, we cannot ensure that it will always work on either your end or our end. If the connection with a video visit is poor, the visit may have to be switched to a telephone visit. With either a video or telephone visit, we are not always able to ensure that we have a secure connection.  By engaging in this virtual visit, you consent to the provision of healthcare and authorize for your insurance to be billed (if applicable) for the services provided during this visit. Depending on your insurance coverage, you may receive a charge related to this service.  I need to obtain your verbal consent now. Are you willing to proceed with your visit today? Jody Carter has provided verbal consent on 03/25/2023 for a virtual visit (video or telephone). [name], PA-C  Date: 03/25/2023 10:14 AM   Virtual Visit via Video Note   I, [name], connected with  Jody Carter  ([id number], March 08, 1995) on 03/25/23 at  9:30 AM EST by a video-enabled telemedicine application and verified that I am speaking with the correct person using two identifiers.  Location: Patient: Virtual Visit  Location Patient: Home Provider: Virtual Visit Location Provider: Home Office   I discussed the limitations of evaluation and management by telemedicine and the availability of in person appointments. The patient expressed understanding and agreed to proceed.    History of Present Illness: Jody Carter is a 28 y.o. who identifies as a female who was assigned female at birth, and is being seen today for tinnitus in right ear. Reports a knot behind the right ear that radiates to the right jaw. This has been going on for over a month. Now starting to feel on the left. Pain is throbbing mostly, but can be sharp.  Reports also having fluid retention. Right hand is double the size of her left.   Has been having ongoing issues with swelling of joints bilaterally, particularly knees, and other inflammatory symptoms. Is in the process of being worked up for an autoimmune process (suspected Lupus, per patient) by her Primary Care Provider. Even had an appt with him today, but he had to call off due to influenza. She is scheduled to see her PCP on Monday, 03/28/23.   Problems:  Patient Active Problem List   Diagnosis Date Noted   Cervical spondylosis 02/22/2023   ANA positive 02/22/2023   IBS (irritable bowel syndrome) 02/22/2023   Hypothyroidism 02/22/2023   Alopecia 02/22/2023   Acne 02/22/2023   GERD (gastroesophageal reflux disease) 02/22/2023   Seizures (HCC) 10/28/2019   Migraine 07/31/2012    Allergies:  Allergies  Allergen Reactions   Sumatriptan  Other (See Comments)    Neck and chest pressure, parasthesias   Medications:  Current Outpatient Medications:    amoxicillin-clavulanate (AUGMENTIN) 875-125 MG tablet, Take 1 tablet by mouth 2 (two) times daily., Disp: 20 tablet, Rfl: 0   predniSONE (DELTASONE) 20 MG tablet, Take 2 tablets (40 mg total) by mouth daily with breakfast., Disp: 14 tablet, Rfl: 0   albuterol (VENTOLIN HFA) 108 (90 Base) MCG/ACT inhaler, Inhale 2 puffs into  the lungs., Disp: , Rfl:    famotidine (PEPCID) 20 MG tablet, Take 1 tablet (20 mg total) by mouth 2 (two) times daily., Disp: 30 tablet, Rfl: 0   fluocinonide (LIDEX) 0.05 % external solution, SMARTSIG:10-15 Drop(s) Topical Every Night, Disp: , Rfl:    furosemide (LASIX) 20 MG tablet, Take 1 tablet (20 mg total) by mouth daily., Disp: 30 tablet, Rfl: 0   ketoconazole (NIZORAL) 2 % shampoo, SMARTSIG:Topical 4 Times a Week, Disp: , Rfl:    levETIRAcetam (KEPPRA) 100 MG/ML solution, Take by mouth 2 (two) times daily., Disp: , Rfl:    levothyroxine (SYNTHROID, LEVOTHROID) 88 MCG tablet, Take 1 tablet (88 mcg total) by mouth daily before breakfast. For hypothyroidism, Disp: 30 tablet, Rfl: 0   linaclotide (LINZESS) 290 MCG CAPS capsule, Take 1 capsule (290 mcg total) by mouth daily before breakfast., Disp: 90 capsule, Rfl: 3   meloxicam (MOBIC) 15 MG tablet, Take 1 tablet (15 mg total) by mouth daily., Disp: 30 tablet, Rfl: 0   omeprazole (PRILOSEC) 40 MG capsule, Take 40 mg by mouth daily., Disp: , Rfl:    ondansetron (ZOFRAN-ODT) 4 MG disintegrating tablet, Take 1 tablet (4 mg total) by mouth every 8 (eight) hours as needed for nausea or vomiting., Disp: 12 tablet, Rfl: 0   promethazine (PHENERGAN) 25 MG tablet, Take 1 tablet (25 mg total) by mouth every 6 (six) hours as needed for nausea or vomiting., Disp: 30 tablet, Rfl: 0   spironolactone (ALDACTONE) 50 MG tablet, Take by mouth., Disp: , Rfl:    sucralfate (CARAFATE) 1 g tablet, Take 1 tablet (1 g total) by mouth 4 (four) times daily as needed., Disp: 30 tablet, Rfl: 0   Vitamin D, Ergocalciferol, (DRISDOL) 1.25 MG (50000 UNIT) CAPS capsule, Take 50,000 Units by mouth every 7 (seven) days., Disp: , Rfl:   Observations/Objective: Patient is well-developed, well-nourished in no acute distress.  Resting comfortably at home.  Head is normocephalic, atraumatic.  No labored breathing.  Speech is clear and coherent with logical content.  Patient is  alert and oriented at baseline.    Assessment and Plan: 1. Right ear pain (Primary) - amoxicillin-clavulanate (AUGMENTIN) 875-125 MG tablet; Take 1 tablet by mouth 2 (two) times daily.  Dispense: 20 tablet; Refill: 0 - predniSONE (DELTASONE) 20 MG tablet; Take 2 tablets (40 mg total) by mouth daily with breakfast.  Dispense: 14 tablet; Refill: 0  2. Tinnitus of right ear - amoxicillin-clavulanate (AUGMENTIN) 875-125 MG tablet; Take 1 tablet by mouth 2 (two) times daily.  Dispense: 20 tablet; Refill: 0 - predniSONE (DELTASONE) 20 MG tablet; Take 2 tablets (40 mg total) by mouth daily with breakfast.  Dispense: 14 tablet; Refill: 0  3. Body fluid retention - furosemide (LASIX) 20 MG tablet; Take 1 tablet (20 mg total) by mouth daily.  Dispense: 30 tablet; Refill: 0  - Patient having pain and tenderness over the Mastoid process of the right ear with associated fullness and ringing.  - Will cover with broad spectrum antibiotic, Augmentin, to cover for mastoiditis,  labyrinthitis, and acute otitis media - Prednisone added for inflammatory process and should help for possible labyrinthitis and/or ETD - Furosemide added for fluid retention to use as needed - Keep scheduled follow up with PCP  Follow Up Instructions: I discussed the assessment and treatment plan with the patient. The patient was provided an opportunity to ask questions and all were answered. The patient agreed with the plan and demonstrated an understanding of the instructions.  A copy of instructions were sent to the patient via MyChart unless otherwise noted below.    The patient was advised to call back or seek an in-person evaluation if the symptoms worsen or if the condition fails to improve as anticipated.    [name], PA-C

## 2023-03-25 NOTE — Patient Instructions (Signed)
 Jody Carter, thank you for joining [name], PA-C for today's virtual visit.  While this provider is not your primary care provider (PCP), if your PCP is located in our provider database this encounter information will be shared with them immediately following your visit.   A [facility] MyChart account gives you access to today's visit and all your visits, tests, and labs performed at [facility] " click here if you don't have a [facility] MyChart account or go to [email or web address]  Consent: (Patient) Jody Carter provided verbal consent for this virtual visit at the beginning of the encounter.  Current Medications:  Current Outpatient Medications:    amoxicillin-clavulanate (AUGMENTIN) 875-125 MG tablet, Take 1 tablet by mouth 2 (two) times daily., Disp: 20 tablet, Rfl: 0   predniSONE (DELTASONE) 20 MG tablet, Take 2 tablets (40 mg total) by mouth daily with breakfast., Disp: 14 tablet, Rfl: 0   albuterol (VENTOLIN HFA) 108 (90 Base) MCG/ACT inhaler, Inhale 2 puffs into the lungs., Disp: , Rfl:    famotidine (PEPCID) 20 MG tablet, Take 1 tablet (20 mg total) by mouth 2 (two) times daily., Disp: 30 tablet, Rfl: 0   fluocinonide (LIDEX) 0.05 % external solution, SMARTSIG:10-15 Drop(s) Topical Every Night, Disp: , Rfl:    furosemide (LASIX) 20 MG tablet, Take 1 tablet (20 mg total) by mouth daily., Disp: 30 tablet, Rfl: 0   ketoconazole (NIZORAL) 2 % shampoo, SMARTSIG:Topical 4 Times a Week, Disp: , Rfl:    levETIRAcetam (KEPPRA) 100 MG/ML solution, Take by mouth 2 (two) times daily., Disp: , Rfl:    levothyroxine (SYNTHROID, LEVOTHROID) 88 MCG tablet, Take 1 tablet (88 mcg total) by mouth daily before breakfast. For hypothyroidism, Disp: 30 tablet, Rfl: 0   linaclotide (LINZESS) 290 MCG CAPS capsule, Take 1 capsule (290 mcg total) by mouth daily before breakfast., Disp: 90 capsule, Rfl: 3   meloxicam (MOBIC) 15 MG tablet, Take 1 tablet (15 mg total) by  mouth daily., Disp: 30 tablet, Rfl: 0   omeprazole (PRILOSEC) 40 MG capsule, Take 40 mg by mouth daily., Disp: , Rfl:    ondansetron (ZOFRAN-ODT) 4 MG disintegrating tablet, Take 1 tablet (4 mg total) by mouth every 8 (eight) hours as needed for nausea or vomiting., Disp: 12 tablet, Rfl: 0   promethazine (PHENERGAN) 25 MG tablet, Take 1 tablet (25 mg total) by mouth every 6 (six) hours as needed for nausea or vomiting., Disp: 30 tablet, Rfl: 0   spironolactone (ALDACTONE) 50 MG tablet, Take by mouth., Disp: , Rfl:    sucralfate (CARAFATE) 1 g tablet, Take 1 tablet (1 g total) by mouth 4 (four) times daily as needed., Disp: 30 tablet, Rfl: 0   Vitamin D, Ergocalciferol, (DRISDOL) 1.25 MG (50000 UNIT) CAPS capsule, Take 50,000 Units by mouth every 7 (seven) days., Disp: , Rfl:    Medications ordered in this encounter:  Meds ordered this encounter  Medications   amoxicillin-clavulanate (AUGMENTIN) 875-125 MG tablet    Sig: Take 1 tablet by mouth 2 (two) times daily.    Dispense:  20 tablet    Refill:  0    Supervising Provider:   Merrilee Jansky [[id number]]   predniSONE (DELTASONE) 20 MG tablet    Sig: Take 2 tablets (40 mg total) by mouth daily with breakfast.    Dispense:  14 tablet    Refill:  0    Supervising Provider:   Merrilee Jansky [[id number]]   DISCONTD: furosemide (LASIX) 20 MG  tablet    Sig: Take 1 tablet (20 mg total) by mouth daily.    Dispense:  30 tablet    Refill:  3    Supervising Provider:   Merrilee Jansky [[id number]]   furosemide (LASIX) 20 MG tablet    Sig: Take 1 tablet (20 mg total) by mouth daily.    Dispense:  30 tablet    Refill:  0    Please note, if not filled yet, to not offer refills. First prescription with refills was sent in error    Supervising Provider:   Merrilee Jansky [[id number]]     *If you need refills on other medications prior to your next appointment, please contact your pharmacy*  Follow-Up: Call back or seek an in-person evaluation if  the symptoms worsen or if the condition fails to improve as anticipated.  [facility] [phone]  Other Instructions  Labyrinthitis  Labyrinthitis is an infection of the inner ear. Your inner ear is made up of tubes and canals (labyrinth). These are filled with fluid. There are nerve cells in your inner ear that send hearing and balance signals to your brain. When germs get inside the tubes and canals, they harm the nerve cells that send signals to the brain. This condition often starts all of a sudden and goes away in a few weeks with treatment. If the infection harms parts of the tubes and canals, some symptoms may last for a long time. What are the causes? This condition can be caused by viruses, such as one that causes: Mononucleosis, also called mono. Measles or mumps. The flu. Herpes. This condition can also be caused by bacteria that spread from an infection in the brain or the middle ear. What increases the risk? You may be at greater risk for this condition if: You had a mouth, nose, throat, or ear infection not long ago. You drink a lot of alcohol. You smoke. You use certain drugs. You are feeling tired (fatigued). You have a lot of stress. You have allergies. What are the signs or symptoms? Symptoms of this condition often start all of a sudden. The symptoms may range from mild to very bad, and may include: Dizziness. Hearing loss. A feeling that you or the things around you are moving when they are not (vertigo). Ringing in your ear (tinnitus). A feeling like you may vomit (nausea). Vomiting. Trouble focusing your eyes. If you have symptoms that last a long time, they may include: Feeling tired. Confusion. Hearing loss. Ringing in your ear. Poor balance. A feeling that you or the things around you are moving when they are not if you move your head suddenly. How is this treated? Treatment depends on the cause. If your condition is caused by  bacteria, you may need antibiotic medicine. If it is caused by a virus, it may get better on its own. No matter the cause, you may be treated with: Medicines to: Stop dizziness. Stop the feeling that you may vomit. Reduce irritation and swelling. Get better faster. Fluids through an IV tube. These may be given at a hospital. You may need IV fluids if you have very bad vomiting or feelings like you may vomit. Physical therapy. A therapist can teach you exercises to help you get used to feeling dizzy. You may need this if you have dizziness that does not go away. Follow these instructions at home: Medicines Take over-the-counter and prescription medicines only as told by your doctor. If you  were prescribed an antibiotic medicine, take it as told by your doctor. Do not stop taking it even if you start to feel better. Activity Rest as told by your doctor. Limit the things you do as told. Return to your normal activities when your doctor says that it is safe. Do not make any sudden movements until you no longer feel dizzy. Do exercises as told by your doctor. General instructions Avoid loud noises and bright lights. Do not drive until your doctor says that it is safe for you. Drink enough fluid to keep your pee (urine) pale yellow. Keep all follow-up visits. Contact a doctor if: Your symptoms do not get better with medicine. You do not get better after 2 weeks. You have a fever. Get help right away if: You keep feeling like you may vomit. You keep vomiting. You are very dizzy. Your hearing gets much worse all of a sudden. Summary Labyrinthitis is an infection of the inner ear. This condition is often caused by a virus. Symptoms include dizziness, hearing loss, and ringing in the ears. Treatment depends on the cause. Do what your doctor tells you. This information is not intended to replace advice given to you by your health care provider. Make sure you discuss any questions you have  with your health care provider. Document Revised: 02/20/2020 Document Reviewed: 02/20/2020 Elsevier Patient Education  2024 Elsevier Inc.   If you have been instructed to have an in-person evaluation today at a local Urgent Care facility, please use the link below. It will take you to a list of all of our available [facility], including address, phone number and hours of operation. Please do not delay care.  [facility]  If you or a family member do not have a primary care provider, use the link below to schedule a visit and establish care. When you choose a [facility] primary care physician or advanced practice provider, you gain a long-term partner in health. Find a Primary Care Provider  Learn more about [facility]'s in-office and virtual care options: [facility] - Get Care Now
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
## 2023-03-28 ENCOUNTER — Ambulatory Visit: Payer: Medicare Other | Admitting: Family Medicine

## 2023-03-28 ENCOUNTER — Ambulatory Visit: Payer: Medicare Other | Admitting: Sports Medicine

## 2023-03-30 ENCOUNTER — Telehealth: Payer: Self-pay | Admitting: Family Medicine

## 2023-03-30 ENCOUNTER — Ambulatory Visit: Payer: Self-pay | Admitting: Family Medicine

## 2023-03-30 NOTE — Telephone Encounter (Signed)
 Called pt on Sunday, 03/27/23, due to needing to reschedule her virtual visit since PCP would be out of office. At the time of the call, pt explained that she had already had her symptoms evaluated elsewhere and was on a treatment plan but wanted to follow up with her PCP. At that time, pt decided to change her VV to an OV.   Upon seeing that patient included she was having vision changes in her appointment notes, I called pt and transferred her to [place], [place] Triage nurse.

## 2023-03-30 NOTE — Telephone Encounter (Signed)
 Chief Complaint: Vision change Symptoms: Blurry vision, headaches, dizziness, ear pressure, syncope Frequency: Off and on Pertinent Negatives: Patient denies eye pain, fever, confusion, memory loss, current dizziness Disposition: [] ED /[] Urgent Care (no appt availability in office) / [x] Appointment(In office/virtual)/ []  [facility]/ [] Home Care/ [] Refused Recommended Disposition /[] [facility]/ []  Follow-up with PCP Additional Notes: Patient called with complaints of vision changes that started a few weeks ago. Patient states she started having vision changes when she developed back of head headaches with a knot on the right side that are usually constant and worse with palpation. Patient states her right eye is worse than the left and vision is blurry with fluctuations of intensity from moderate to severe. Patient states blurry vision comes and goes and lasts at least 30 min, no pattern, and has trouble seeing fine print to severe enough where she can't distinguishing shapes. Patient states she has also been experiencing dizziness off and on and had an syncopal episode on Saturday and Monday where her whole body felt like it was spinning, went numb then states she passed out. Patient could not confirm or deny head injury, but states she has no new symptoms. Patient states she has ear pressure and severe tinnitus in both ears. Patient was seen for symptoms last week at an UC where the provider prescribed prednisone and Augmentin, and gave suggestion patient may have mastoiditis. Patient denies fever, current dizziness, confusion, memory loss. Patient advised by this RN to keep appt with provider for tomorrow per protocol and to call back with worsening symptoms. Patient verbalized understanding,   Reason for Disposition  [1] Blurred vision or visual changes AND [2] gradual onset (e.g., weeks, months)  Answer Assessment - Initial Assessment Questions 1. DESCRIPTION: "How has  your vision changed?" (e.g., complete vision loss, blurred vision, double vision, floaters, etc.)     Blurry 2. LOCATION: "One or both eyes?" If one, ask: "Which eye?"    Both, right eye worse 3. SEVERITY: "Can you see anything?" If Yes, ask: "What can you see?" (e.g., fine print)     Blurry, "sometimes I can, sometimes I can't 4. ONSET: "When did this begin?" "Did it start suddenly or has this been gradual?"     Couple weeks go 5. PATTERN: "Does this come and go, or has it been constant since it started?"     Comes on really quickly, often at least 30 minutes  6. PAIN: "Is there any pain in your eye(s)?"  (Scale 1-10; or mild, moderate, severe)   - NONE (0): No pain.   - MILD (1-3): Doesn't interfere with normal activities.   - MODERATE (4-7): Interferes with normal activities or awakens from sleep.    - SEVERE (8-10): Excruciating pain, unable to do any normal activities.     Moderate  7. CONTACTS-GLASSES: "Do you wear contacts or glasses?"     Denies 8. CAUSE: "What do you think is causing this visual problem?"     "I was having some pain on the side of my head and a knot there. I saw the doctor at the [facility] and she thought it was mastoiditis" 9. OTHER SYMPTOMS: "Do you have any other symptoms?" (e.g., confusion, headache, arm or leg weakness, speech problems)     Fluid, headaches, dizziness, tinnitus   10. PREGNANCY: "Is there any chance you are pregnant?" "When was your last menstrual period?"       Denies  Saturday, Monday - "whole body went numb and hit the  ground" Augmentin, prednisone  Protocols used: Vision Loss or Change-A-AH

## 2023-03-31 ENCOUNTER — Ambulatory Visit (INDEPENDENT_AMBULATORY_CARE_PROVIDER_SITE_OTHER): Admitting: Family Medicine

## 2023-03-31 ENCOUNTER — Encounter: Payer: Self-pay | Admitting: Family Medicine

## 2023-03-31 VITALS — BP 130/82 | HR 87 | Temp 98.6°F | Ht 63.0 in | Wt 132.6 lb

## 2023-03-31 DIAGNOSIS — G43809 Other migraine, not intractable, without status migrainosus: Secondary | ICD-10-CM

## 2023-03-31 DIAGNOSIS — E039 Hypothyroidism, unspecified: Secondary | ICD-10-CM

## 2023-03-31 DIAGNOSIS — F439 Reaction to severe stress, unspecified: Secondary | ICD-10-CM | POA: Diagnosis not present

## 2023-03-31 DIAGNOSIS — R569 Unspecified convulsions: Secondary | ICD-10-CM | POA: Diagnosis not present

## 2023-03-31 DIAGNOSIS — R768 Other specified abnormal immunological findings in serum: Secondary | ICD-10-CM | POA: Diagnosis not present

## 2023-03-31 DIAGNOSIS — R600 Localized edema: Secondary | ICD-10-CM

## 2023-03-31 DIAGNOSIS — F419 Anxiety disorder, unspecified: Secondary | ICD-10-CM | POA: Insufficient documentation

## 2023-03-31 LAB — COMPREHENSIVE METABOLIC PANEL
ALT: 12 U/L (ref 0–35)
AST: 12 U/L (ref 0–37)
Albumin: 3.9 g/dL (ref 3.5–5.2)
Alkaline Phosphatase: 55 U/L (ref 39–117)
BUN: 16 mg/dL (ref 6–23)
CO2: 28 meq/L (ref 19–32)
Calcium: 8.8 mg/dL (ref 8.4–10.5)
Chloride: 105 meq/L (ref 96–112)
Creatinine, Ser: 0.62 mg/dL (ref 0.40–1.20)
GFR: 122.82 mL/min (ref >60.00)
Glucose, Bld: 83 mg/dL (ref 70–99)
Potassium: 3.3 meq/L — ABNORMAL LOW (ref 3.5–5.1)
Sodium: 141 meq/L (ref 135–145)
Total Bilirubin: 0.3 mg/dL (ref 0.2–1.2)
Total Protein: 6.2 g/dL (ref 6.0–8.3)

## 2023-03-31 LAB — CBC WITH DIFFERENTIAL/PLATELET
Basophils Absolute: 0.1 10*3/uL (ref 0.0–0.1)
Basophils Relative: 0.5 % (ref 0.0–3.0)
Eosinophils Absolute: 0.3 10*3/uL (ref 0.0–0.7)
Eosinophils Relative: 1.9 % (ref 0.0–5.0)
HCT: 38.9 % (ref 36.0–46.0)
Hemoglobin: 12.7 g/dL (ref 12.0–15.0)
Lymphocytes Relative: 34.4 % (ref 12.0–46.0)
Lymphs Abs: 5.1 10*3/uL — ABNORMAL HIGH (ref 0.7–4.0)
MCHC: 32.6 g/dL (ref 30.0–36.0)
MCV: 95.6 fl (ref 78.0–100.0)
Monocytes Absolute: 0.9 10*3/uL (ref 0.1–1.0)
Monocytes Relative: 5.9 % (ref 3.0–12.0)
Neutro Abs: 8.5 10*3/uL — ABNORMAL HIGH (ref 1.4–7.7)
Neutrophils Relative %: 57.3 % (ref 43.0–77.0)
Platelets: 362 10*3/uL (ref 150.0–400.0)
RBC: 4.07 Mil/uL (ref 3.87–5.11)
RDW: 13.4 % (ref 11.5–15.5)
WBC: 14.8 10*3/uL — ABNORMAL HIGH (ref 4.0–10.5)

## 2023-03-31 LAB — TSH: TSH: 3.04 u[IU]/mL (ref 0.35–5.50)

## 2023-03-31 NOTE — Patient Instructions (Signed)
 It was very nice to see you today!  Your leg swelling should improve once you come off the prednisone.  You can continue taking the Lasix as needed.  I believe that you probably do have an autoimmune condition such as lupus.  The pain in your ear should continue to improve.  We will see you back next week.  Please let us know if your symptoms worsen.  I will refer you to see the therapist.  Return if symptoms worsen or fail to improve.   Take care, Dr Jimmey Ralph  PLEASE NOTE:  If you had any lab tests, please let us know if you have not heard back within a few days. You may see your results on mychart before we have a chance to review them but we will give you a call once they are reviewed by [place].   If we ordered any referrals today, please let us know if you have not heard from their office within the next week.   If you had any urgent prescriptions sent in today, please check with the pharmacy within an hour of our visit to make sure the prescription was transmitted appropriately.   Please try these tips to maintain a healthy lifestyle:  Eat at least 3 REAL meals and 1-2 snacks per day.  Aim for no more than 5 hours between eating.  If you eat breakfast, please do so within one hour of getting up.   Each meal should contain half fruits/vegetables, one quarter protein, and one quarter carbs (no bigger than a computer mouse)  Cut down on sweet beverages. This includes juice, soda, and sweet tea.   Drink at least 1 glass of water with each meal and aim for at least 8 glasses per day  Exercise at least 150 minutes every week.

## 2023-03-31 NOTE — Telephone Encounter (Signed)
 Noted.

## 2023-03-31 NOTE — Assessment & Plan Note (Signed)
 Latest ANA showed titer of 1-160.  We have referred her to a rheumatologist and she will call to schedule appointment soon.

## 2023-03-31 NOTE — Progress Notes (Signed)
 Jody Carter is a 28 y.o. female who presents today for an office visit.  Assessment/Plan:  New/Acute Problems: Ear Pain  No obvious abnormalities on exam.  She still has some tenderness over her mastoid processes bilaterally.  TMs were clear without any signs of infection.  It is reassuring that symptoms are improving.  Did discuss that her symptoms may be related to her potential underlying autoimmune condition or migraine disorder.  Also discussed that she may have had an infection that is now clearing.  We did discuss checking CT scan to further evaluate however given that symptoms are improving she would like to hold off on this for now.  She will follow-up with me next week.  We discussed reasons to return to care and seek emergent care.  If symptoms do not improve or if she has any worsening symptoms we will check CT scan at that time.  Bilateral lower extremity edema No red flags.  She is up about 12 pounds over the last month.  This is likely due to her recent prednisone course.  She does have likely underlying autoimmune condition which may be contributing as well.  She will finish her last dose of prednisone today.  Dissipate that the edema should improve significantly as this washes out of her system.  She also has Lasix to use as needed.  She will follow-up with me next week.  Will check labs today.  Chronic Problems Addressed Today: ANA positive Latest ANA showed titer of 1-160.  We have referred her to a rheumatologist and she will call to schedule appointment soon.  Migraine Overall symptoms are stable on Keppra.  We have referred her to establish with a local neurologist.  It is possible that she may have had a flareup of her migraines with her recent issues as above though symptoms are currently manageable.  Seizures (HCC) Stable on Keppra 100 mg twice daily.  We have referred her to establish with neurology.  Stress She has been under more stress recently especially in  light of potential underlying autoimmune condition.  She has seen a therapist in the past however had to change due to changing insurance.  She would like to be referred to see a therapist.  Will place referral today.  Hypothyroidism On Synthroid 88 mcg daily.  Check TSH.     Subjective:  HPI:  See A/P for status of chronic conditions.  Patient is here today with swelling on the right side of her head with associated vision changes.  She has also gained about 12 pounds in the last month.  She would like to have labs checked today.  Since her last visit she has been following with sports medicine for neck pain.  She was also referred to rheumatology.  Sports medicine to checked an ANA since her last visit which was found to be elevated with a titer of 1-160.  She has not yet been able to see rheumatology.  Her primary concern today as bilateral ear pain.  She did have a virtual visit for this a week ago and was started on Augmentin, prednisone, and Lasix.  There was concern for possible mastoiditis.  She does note that symptoms have improved to the last few days.  Pain predominately located behind both of her ears with right worse than left.  She does have associated ringing in her ears.  Pain was very severe a few days ago but does seem to be improving.  Her headache was associated with  phonophobia.  She feels more pressure on the right side of her face.  Also feels like her eye is heavier.  She is also concerned about her weight gain.  She has noticed much more puffiness in her hands and legs.  She has been taking Lasix which has not made any significant difference.       Objective:  Physical Exam: BP 130/82   Pulse 87   Temp 98.6 F (37 C) (Temporal)   Ht 5\' 3"  (1.6 m)   Wt 132 lb 9.6 oz (60.1 kg)   SpO2 98%   BMI 23.49 kg/m   Wt Readings from Last 3 Encounters:  03/31/23 132 lb 9.6 oz (60.1 kg)  02/28/23 120 lb (54.4 kg)  02/22/23 124 lb (56.2 kg)    Gen: No acute distress, resting  comfortably HEENT: Mastoid processes palpated bilaterally with mild tenderness.  No erythema.  TMs clear bilaterally. MUSCULOSKELETAL: Sock lines present in bilateral legs.  2+ pitting edema to knees bilaterally. Neuro: Grossly normal, moves all extremities Psych: Normal affect and thought content      Sonja Manseau M. Jimmey Ralph, MD 03/31/2023 11:35 AM

## 2023-03-31 NOTE — Assessment & Plan Note (Signed)
 Stable on Keppra 100 mg twice daily.  We have referred her to establish with neurology.

## 2023-03-31 NOTE — Assessment & Plan Note (Signed)
 She has been under more stress recently especially in light of potential underlying autoimmune condition.  She has seen a therapist in the past however had to change due to changing insurance.  She would like to be referred to see a therapist.  Will place referral today.

## 2023-03-31 NOTE — Assessment & Plan Note (Signed)
 Overall symptoms are stable on Keppra.  We have referred her to establish with a local neurologist.  It is possible that she may have had a flareup of her migraines with her recent issues as above though symptoms are currently manageable.

## 2023-03-31 NOTE — Assessment & Plan Note (Signed)
On Synthroid 88 mcg daily.  Check TSH. 

## 2023-04-01 ENCOUNTER — Encounter: Payer: Self-pay | Admitting: Occupational Therapy

## 2023-04-01 ENCOUNTER — Encounter: Payer: Self-pay | Admitting: Family Medicine

## 2023-04-01 ENCOUNTER — Ambulatory Visit: Payer: Medicare Other | Admitting: Physical Therapy

## 2023-04-01 ENCOUNTER — Ambulatory Visit: Payer: Medicare Other | Admitting: Occupational Therapy

## 2023-04-01 NOTE — Therapy (Signed)
 [facility] [address] [place], [place], [address] Phone: [phone]   Fax:  [phone]  Patient Details  Name: Jody Carter MRN: [id number] Date of Birth: 1995/10/31   OCCUPATIONAL THERAPY DISCHARGE SUMMARY  Visits from Start of Care: 1  Phone call: Pt contacted clinic and requested to cancel remaining therapy appointments d/t too much going on right now.  Current functional level related to goals / functional outcomes: Unable to assess goals d/t pt request to D/C.  Remaining deficits: Ongoing impairments, see tx notes for additional details.   Education / Equipment: Pt has some needed materials and education. See tx notes for more details.    Patient goals were unable to be assessed. Patient is being discharged due to the patient's request. Pt agreeable to D/C.  If additional OT services are recommended, a new OT referral will be required.    Wynetta Emery, OT 04/01/2023, 10:06 AM  [facility] [address] [place], [place], [address] Phone: [phone]   Fax:  [phone]

## 2023-04-01 NOTE — Telephone Encounter (Signed)
 Pt scheduled on 3/11 with Dr. Jimmey Ralph.

## 2023-04-01 NOTE — Progress Notes (Signed)
 Her thyroid level is normal.  Her white blood cell count is elevated but improving compared to your previous couple values.  We should recheck this again in a couple of weeks.  Please place future order for CBC with differential.  Her potassium is a little bit low.  This is probably due to her recent fluid retention.  This should improve as her fluid retention improves as well.  We can recheck again in a couple of weeks as well.  Please place future order for CMET.

## 2023-04-05 ENCOUNTER — Ambulatory Visit: Payer: Medicare Other | Admitting: Family Medicine

## 2023-04-06 ENCOUNTER — Other Ambulatory Visit: Payer: Self-pay

## 2023-04-06 DIAGNOSIS — E876 Hypokalemia: Secondary | ICD-10-CM

## 2023-04-06 DIAGNOSIS — D72829 Elevated white blood cell count, unspecified: Secondary | ICD-10-CM

## 2023-04-07 ENCOUNTER — Ambulatory Visit: Admitting: Family Medicine

## 2023-04-07 ENCOUNTER — Telehealth: Admitting: Family Medicine

## 2023-04-07 ENCOUNTER — Encounter: Payer: Medicare Other | Admitting: Occupational Therapy

## 2023-04-07 ENCOUNTER — Encounter: Payer: Self-pay | Admitting: Family Medicine

## 2023-04-07 ENCOUNTER — Ambulatory Visit: Payer: Medicare Other | Admitting: Physical Therapy

## 2023-04-07 VITALS — Ht 63.0 in

## 2023-04-07 DIAGNOSIS — G4700 Insomnia, unspecified: Secondary | ICD-10-CM | POA: Diagnosis not present

## 2023-04-07 DIAGNOSIS — R519 Headache, unspecified: Secondary | ICD-10-CM | POA: Diagnosis not present

## 2023-04-07 DIAGNOSIS — G43809 Other migraine, not intractable, without status migrainosus: Secondary | ICD-10-CM

## 2023-04-07 DIAGNOSIS — R768 Other specified abnormal immunological findings in serum: Secondary | ICD-10-CM

## 2023-04-07 MED ORDER — TRAZODONE HCL 50 MG PO TABS
25.0000 mg | ORAL_TABLET | Freq: Every evening | ORAL | 3 refills | Status: DC | PRN
Start: 2023-04-07 — End: 2023-05-02

## 2023-04-07 NOTE — Assessment & Plan Note (Signed)
 Her above headache may be an atypical migraine though given lack of improvement and the fact that it is not consistent with previous migraines we will check a CT scan as above.  She can continue her Keppra.

## 2023-04-07 NOTE — Assessment & Plan Note (Signed)
 Rheumatology referral is still pending.  She will call to schedule appointment soon.

## 2023-04-07 NOTE — Assessment & Plan Note (Signed)
 Symptoms are not controlled.  This has been an ongoing issue for many years but is flared up recently.  It is possible that her recent course of prednisone may have exacerbated this.  She has not had any improvement with Unisom or Benadryl.  We discussed treatment options.  Will try trazodone.  She is aware of potential side effects.  She will follow-up with me in a week or 2 via MyChart.

## 2023-04-07 NOTE — Progress Notes (Signed)
   Carisma KERRIANNE JENG is a 28 y.o. female who presents today for a virtual office visit.  Assessment/Plan:  New/Acute Problems: Headache Discussed limitations of virtual visit and inability to perform physical exam.  She has not had any significant change in symptoms since our visit a week ago.  Her headache is not consistent with her previous migraines.  Will check a head CT scan at this point to rule out infection or other possible etiologies.  If CT scan is negative will need to have her follow-up with neurology.  Chronic Problems Addressed Today: Insomnia Symptoms are not controlled.  This has been an ongoing issue for many years but is flared up recently.  It is possible that her recent course of prednisone may have exacerbated this.  She has not had any improvement with Unisom or Benadryl.  We discussed treatment options.  Will try trazodone.  She is aware of potential side effects.  She will follow-up with me in a week or 2 via MyChart.  Migraine Her above headache may be an atypical migraine though given lack of improvement and the fact that it is not consistent with previous migraines we will check a CT scan as above.  She can continue her Keppra.  ANA positive Rheumatology referral is still pending.  She will call to schedule appointment soon.     Subjective:  HPI:  See A/P for status of chronic conditions.  Patient is here today for follow-up.  I saw her a week ago.  At that time and spent a significant mount of time discussing her recent positive ANA titer.  We checked labs at her last visit which showed improving leukocytosis and low potassium.  She will be coming back soon for repeat blood work.  Unfortunately since her last visit she has not had any change in symptoms with her headache and head pain.  Still having a lot of ringing in her ears.  Still some associated phonophobia.  Also some pressure on the right side of her face.  We did discuss obtaining a CT scan at her last  visit however she would like to defer at that time though she is now interested in pursuing this.  She does think that the puffiness in her legs has improved since her last visit.  She is also having more difficulty with sleep.  This is an ongoing issue with her for many years however has significantly worsened the last few weeks.  She has been on medications for this in the past including Ambien which she believes she did well with this.  She is interested in restarting a prescription medication.  She has tried Benadryl and Unisom without much improvement.        Objective/Observations  Physical Exam: Gen: NAD, resting comfortably Pulm: Normal work of breathing Neuro: Grossly normal, moves all extremities Psych: Normal affect and thought content  Virtual Visit via Video   I connected with [name] on 04/07/23 at 11:40 AM EDT by a video enabled telemedicine application and verified that I am speaking with the correct person using two identifiers. The limitations of evaluation and management by telemedicine and the availability of in person appointments were discussed. The patient expressed understanding and agreed to proceed.   Patient location: Home Provider location: [place] [employer] Persons participating in the virtual visit: Myself and Patient     Katina Degree. Jimmey Ralph, MD 04/07/2023 12:11 PM

## 2023-04-10 ENCOUNTER — Ambulatory Visit (HOSPITAL_BASED_OUTPATIENT_CLINIC_OR_DEPARTMENT_OTHER)
Admission: RE | Admit: 2023-04-10 | Discharge: 2023-04-10 | Disposition: A | Discharge status: Home / Self Care | Source: Ambulatory Visit | Attending: Family Medicine | Admitting: Family Medicine

## 2023-04-10 DIAGNOSIS — R519 Headache, unspecified: Secondary | ICD-10-CM | POA: Diagnosis present

## 2023-04-11 ENCOUNTER — Encounter: Payer: Self-pay | Admitting: Family Medicine

## 2023-04-11 NOTE — Progress Notes (Signed)
 Her head CT scan is normal.  No signs of bleeding or other major abnormalities.  Her headache may be due to her migraines or potentially due to the degenerative changes in her neck.  Recommend she follow-up with neurology soon.  Please place new referral if needed though she has a referral from 02/25/2023 already pending.  She should let us know if her symptoms are worsening.

## 2023-04-12 ENCOUNTER — Ambulatory Visit: Admitting: Licensed Clinical Social Worker

## 2023-04-12 ENCOUNTER — Other Ambulatory Visit: Payer: Self-pay | Admitting: *Deleted

## 2023-04-12 DIAGNOSIS — F4310 Post-traumatic stress disorder, unspecified: Secondary | ICD-10-CM

## 2023-04-12 DIAGNOSIS — R768 Other specified abnormal immunological findings in serum: Secondary | ICD-10-CM

## 2023-04-12 DIAGNOSIS — G43909 Migraine, unspecified, not intractable, without status migrainosus: Secondary | ICD-10-CM
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
## 2023-04-12 NOTE — Progress Notes (Addendum)
 [facility]  TREATMENT PLAN  Name: Jody Carter Date: 04/12/2023 MRN: [id number] DOB: 1995-08-29 PCP: Ardith Dark, MD DIAGNOSIS: PTSD   Individualized Treatment Plan Strengths: "I'm a great mother, I am compassionate."  Supports: Partner-Chad-Been dating for nearly a year.   Goal/Needs for Treatment:  In order of importance to patient 1) "I want to gain coping skills to deal with the body dysmorphia." 2) "I want to gain coping skills to cope with the triggers related to the PTSD."    Client Statement of Needs: "I want to let go of things and feel better."   Treatment Level:Moderate-Bi-weekly  Symptoms: Anxiety, depression, fear, insomnia, excessive worry, negative thoughts,   Client Treatment Preferences: Cognitive Behavioral Therapy   Healthcare consumer's goal for treatment:  Therapist, Phyllis Ginger MSW, LCSW will support the patient's ability to achieve the goals identified. Cognitive Behavioral Therapy, Assertive Communication/Conflict Resolution Training, Relaxation Training, ACT, Humanistic and other evidenced-based practices will be used to promote progress towards healthy functioning.   Healthcare consumer will: Actively participate in therapy, working towards healthy functioning.    *Justification for Continuation/Discontinuation of Goal: R=Revised, O=Ongoing, A=Achieved, D=Discontinued  Goal 1) "I want to gain coping skills to deal with the body dysmorphia." Baseline date 04/12/2023: Progress towards goal Ongoing; How Often - Daily Target Date Goal Was reviewed Status Code Progress towards goal/Likert rating  04/11/2024  O              Goal 2) "I want to gain coping skills to cope with the triggers related to the PTSD." Baseline date 04/12/2023: Progress towards goal Ongoing; How Often - Daily Target Date Goal Was reviewed Status Code Progress towards goal  04/11/2024  O               This plan has been reviewed and created by the following  participants:  This plan will be reviewed at least every 12 months. Date Behavioral Health Clinician Date Guardian/Patient   04/12/2023 Phyllis Ginger MSW, LCSW  04/12/2023 Verbal Consent Provided                  Phyllis Ginger MSW, LCSW/DATE 04/12/2023

## 2023-04-13 ENCOUNTER — Encounter: Payer: Self-pay | Admitting: Neurology

## 2023-04-14 ENCOUNTER — Ambulatory Visit: Payer: Medicare Other | Admitting: Physical Therapy

## 2023-04-14 ENCOUNTER — Encounter: Payer: Medicare Other | Admitting: Occupational Therapy

## 2023-04-14 ENCOUNTER — Other Ambulatory Visit: Payer: Self-pay | Admitting: Physician Assistant

## 2023-04-14 DIAGNOSIS — R609 Edema, unspecified: Secondary | ICD-10-CM

## 2023-04-14 MED ORDER — FUROSEMIDE 20 MG PO TABS: 20.0000 mg | ORAL_TABLET | Freq: Every day | ORAL | 0 refills | Status: DC

## 2023-04-21 ENCOUNTER — Ambulatory Visit: Payer: Medicare Other | Admitting: Physical Therapy

## 2023-04-21 ENCOUNTER — Encounter: Payer: Medicare Other | Admitting: Occupational Therapy

## 2023-04-23 ENCOUNTER — Telehealth

## 2023-04-25 ENCOUNTER — Encounter: Payer: Self-pay | Admitting: Family Medicine

## 2023-04-25 NOTE — Telephone Encounter (Signed)
Please schedule an office visit with PCP

## 2023-04-26 ENCOUNTER — Ambulatory Visit: Admitting: Family Medicine

## 2023-04-26 ENCOUNTER — Encounter (HOSPITAL_BASED_OUTPATIENT_CLINIC_OR_DEPARTMENT_OTHER): Payer: Self-pay | Admitting: Emergency Medicine

## 2023-04-26 ENCOUNTER — Ambulatory Visit: Payer: Self-pay

## 2023-04-26 ENCOUNTER — Ambulatory Visit: Admitting: Licensed Clinical Social Worker

## 2023-04-26 ENCOUNTER — Emergency Department (HOSPITAL_BASED_OUTPATIENT_CLINIC_OR_DEPARTMENT_OTHER)

## 2023-04-26 ENCOUNTER — Emergency Department (HOSPITAL_BASED_OUTPATIENT_CLINIC_OR_DEPARTMENT_OTHER): Admitting: Radiology

## 2023-04-26 ENCOUNTER — Emergency Department (HOSPITAL_BASED_OUTPATIENT_CLINIC_OR_DEPARTMENT_OTHER)
Admission: EM | Admit: 2023-04-26 | Discharge: 2023-04-26 | Disposition: A | Discharge status: Home / Self Care | Attending: Emergency Medicine | Admitting: Emergency Medicine

## 2023-04-26 ENCOUNTER — Other Ambulatory Visit: Payer: Self-pay

## 2023-04-26 DIAGNOSIS — R0789 Other chest pain: Secondary | ICD-10-CM | POA: Insufficient documentation

## 2023-04-26 LAB — COMPREHENSIVE METABOLIC PANEL WITH GFR
ALT: 12 U/L (ref 0–44)
AST: 10 U/L — ABNORMAL LOW (ref 15–41)
Albumin: 3.7 g/dL (ref 3.5–5.0)
Alkaline Phosphatase: 51 U/L (ref 38–126)
Anion gap: 6 (ref 5–15)
BUN: 14 mg/dL (ref 6–20)
CO2: 23 mmol/L (ref 22–32)
Calcium: 8.4 mg/dL — ABNORMAL LOW (ref 8.9–10.3)
Chloride: 110 mmol/L (ref 98–111)
Creatinine, Ser: 0.44 mg/dL (ref 0.44–1.00)
GFR, Estimated: >60 mL/min (ref >60)
Glucose, Bld: 95 mg/dL (ref 70–99)
Potassium: 4.3 mmol/L (ref 3.5–5.1)
Sodium: 139 mmol/L (ref 135–145)
Total Bilirubin: 0.2 mg/dL (ref 0.0–1.2)
Total Protein: 5.9 g/dL — ABNORMAL LOW (ref 6.5–8.1)

## 2023-04-26 LAB — CBC WITH DIFFERENTIAL/PLATELET
Abs Immature Granulocytes: 0.03 K/uL (ref 0.00–0.07)
Basophils Absolute: 0.1 K/uL (ref 0.0–0.1)
Basophils Relative: 1 %
Eosinophils Absolute: 0.2 K/uL (ref 0.0–0.5)
Eosinophils Relative: 2 %
HCT: 35.2 % — ABNORMAL LOW (ref 36.0–46.0)
Hemoglobin: 11.7 g/dL — ABNORMAL LOW (ref 12.0–15.0)
Immature Granulocytes: 0 %
Lymphocytes Relative: 24 %
Lymphs Abs: 1.8 K/uL (ref 0.7–4.0)
MCH: 31.5 pg (ref 26.0–34.0)
MCHC: 33.2 g/dL (ref 30.0–36.0)
MCV: 94.9 fL (ref 80.0–100.0)
Monocytes Absolute: 0.4 K/uL (ref 0.1–1.0)
Monocytes Relative: 6 %
Neutro Abs: 5 K/uL (ref 1.7–7.7)
Neutrophils Relative %: 67 %
Platelets: 318 K/uL (ref 150–400)
RBC: 3.71 MIL/uL — ABNORMAL LOW (ref 3.87–5.11)
RDW: 13.2 % (ref 11.5–15.5)
WBC: 7.5 K/uL (ref 4.0–10.5)
nRBC: 0 % (ref 0.0–0.2)

## 2023-04-26 LAB — LIPASE, BLOOD: Lipase: 17 U/L (ref 11–51)

## 2023-04-26 LAB — HCG, SERUM, QUALITATIVE: Preg, Serum: NEGATIVE

## 2023-04-26 LAB — TROPONIN I (HIGH SENSITIVITY)
Troponin I (High Sensitivity): <2 ng/L (ref <18)
Troponin I (High Sensitivity): <2 ng/L (ref <18)

## 2023-04-26 MED ORDER — LACTATED RINGERS IV BOLUS
1000.0000 mL | Freq: Once | INTRAVENOUS | Status: AC
  Administered 2023-04-26: 1000 mL via INTRAVENOUS

## 2023-04-26 MED ORDER — MORPHINE SULFATE (PF) 2 MG/ML IV SOLN
2.0000 mg | Freq: Once | INTRAVENOUS | Status: AC
  Administered 2023-04-26: 2 mg via INTRAVENOUS
  Filled 2023-04-26: qty 1

## 2023-04-26 MED ORDER — ONDANSETRON HCL 4 MG/2ML IJ SOLN
4.0000 mg | Freq: Once | INTRAMUSCULAR | Status: AC
  Administered 2023-04-26: 4 mg via INTRAVENOUS
  Filled 2023-04-26: qty 2

## 2023-04-26 MED ORDER — IOHEXOL 350 MG/ML SOLN
100.0000 mL | Freq: Once | INTRAVENOUS | Status: AC | PRN
  Administered 2023-04-26: 75 mL via INTRAVENOUS

## 2023-04-26 NOTE — Discharge Instructions (Addendum)
 You were seen in the emergency department today for concerns of chest pain.  Your labs and imaging were all thankfully reassuring with no abnormalities found.  Will strongly recommend continuing to plan on following up with your rheumatologist given your concerns for possible lupus.  Also follow-up with your primary care provider.  For any concerns of new or worsening symptoms, return to the emergency department.

## 2023-04-26 NOTE — Telephone Encounter (Signed)
Patient present at ED 

## 2023-04-26 NOTE — ED Notes (Signed)
 Pt aware of the need for a urine... Unable to currently provide the sample.Marland KitchenMarland Kitchen

## 2023-04-26 NOTE — ED Notes (Signed)
 Discharge paperwork given and verbally understood.

## 2023-04-26 NOTE — ED Provider Notes (Signed)
  EMERGENCY DEPARTMENT AT [facility] Provider Note   CSN: [id number] Arrival date & time: 04/26/23  0932     History Chief Complaint  Patient presents with   Chest Pain    Jody Carter is a 28 y.o. female.  Patient presents the emergency department with past history significant for seizures, migraines, ANA positive, GERD, stress, insomnia PTSD here with concerns of chest pain.  Reports that she has been experiencing lower sternal chest pain with some radiation towards the back that began over the last few days.  States that she was newly diagnosed with possible lupus after positive ANA titers but states that she has not been seen by rheumatologist yet.  She denies any prior cardiac abnormalities and is not on any medications for hypertension, hyperlipidemia, or any heart valve issues.  Patient not go on any blood thinners.   Chest Pain      Home Medications Prior to Admission medications   Medication Sig Start Date End Date Taking? Authorizing Provider  albuterol (VENTOLIN HFA) 108 (90 Base) MCG/ACT inhaler Inhale 2 puffs into the lungs. 11/08/22   [provider]  amitriptyline (ELAVIL) 25 MG tablet Take by mouth. 03/04/23   [provider]  famotidine (PEPCID) 20 MG tablet Take 1 tablet (20 mg total) by mouth 2 (two) times daily. 01/05/23   Sabas Sous, MD  fluocinonide (LIDEX) 0.05 % external solution SMARTSIG:10-15 Drop(s) Topical Every Night 02/09/23   [provider]  furosemide (LASIX) 20 MG tablet Take 1 tablet (20 mg total) by mouth daily. 04/14/23   Ardith Dark, MD  ketoconazole (NIZORAL) 2 % shampoo SMARTSIG:Topical 4 Times a Week    [provider]  levETIRAcetam (KEPPRA) 100 MG/ML solution Take by mouth 2 (two) times daily.    [provider]  levothyroxine (SYNTHROID, LEVOTHROID) 88 MCG tablet Take 1 tablet (88 mcg total) by mouth daily before breakfast. For hypothyroidism 04/14/17   Money, Gerlene Burdock,  FNP  linaclotide (LINZESS) 290 MCG CAPS capsule Take 1 capsule (290 mcg total) by mouth daily before breakfast. 02/22/23   Ardith Dark, MD  meloxicam (MOBIC) 15 MG tablet Take 1 tablet (15 mg total) by mouth daily. 02/28/23   Richardean Sale, DO  omeprazole (PRILOSEC) 40 MG capsule Take 40 mg by mouth daily. 02/11/23   [provider]  ondansetron (ZOFRAN-ODT) 4 MG disintegrating tablet Take 1 tablet (4 mg total) by mouth every 8 (eight) hours as needed for nausea or vomiting. 04/08/22   Rondel Baton, MD  promethazine (PHENERGAN) 25 MG tablet Take 1 tablet (25 mg total) by mouth every 6 (six) hours as needed for nausea or vomiting. 01/05/23   Sabas Sous, MD  spironolactone (ALDACTONE) 50 MG tablet Take by mouth. 11/29/22   [provider]  sucralfate (CARAFATE) 1 g tablet Take 1 tablet (1 g total) by mouth 4 (four) times daily as needed. 01/05/23   Sabas Sous, MD  traZODone (DESYREL) 50 MG tablet Take 0.5-1 tablets (25-50 mg total) by mouth at bedtime as needed for sleep. 04/07/23   Ardith Dark, MD  Vitamin D, Ergocalciferol, (DRISDOL) 1.25 MG (50000 UNIT) CAPS capsule Take 50,000 Units by mouth every 7 (seven) days. 02/18/20   [provider]      Allergies    Sumatriptan    Review of Systems   Review of Systems  Cardiovascular:  Positive for chest pain.  All other systems reviewed and are negative.  Physical Exam Updated Vital Signs BP 113/60   Pulse 68   Temp 98.3 F (36.8 C)   Resp 12   LMP 03/31/2023   SpO2 100%  Physical Exam Vitals and nursing note reviewed.  Constitutional:      General: She is not in acute distress.    Appearance: She is well-developed.  HENT:     Head: Normocephalic and atraumatic.  Eyes:     Conjunctiva/sclera: Conjunctivae normal.  Cardiovascular:     Rate and Rhythm: Normal rate and regular rhythm.     Heart sounds: No murmur heard. Pulmonary:     Effort: Pulmonary effort is normal. No respiratory  distress.     Breath sounds: Normal breath sounds. No decreased breath sounds, wheezing, rhonchi or rales.  Chest:     Chest wall: Tenderness present.       Comments: TTP in the lower sternum. No epigastric tenderness. Abdominal:     Palpations: Abdomen is soft.     Tenderness: There is no abdominal tenderness.  Musculoskeletal:        General: No swelling.     Cervical back: Neck supple.  Skin:    General: Skin is warm and dry.     Capillary Refill: Capillary refill takes less than 2 seconds.  Neurological:     Mental Status: She is alert.  Psychiatric:        Mood and Affect: Mood normal.     ED Results / Procedures / Treatments   Labs (all labs ordered are listed, but only abnormal results are displayed) Labs Reviewed  CBC WITH DIFFERENTIAL/PLATELET - Abnormal; Notable for the following components:      Result Value   RBC 3.71 (*)    Hemoglobin 11.7 (*)    HCT 35.2 (*)    All other components within normal limits  COMPREHENSIVE METABOLIC PANEL WITH GFR - Abnormal; Notable for the following components:   Calcium 8.4 (*)    Total Protein 5.9 (*)    AST 10 (*)    All other components within normal limits  LIPASE, BLOOD  HCG, SERUM, QUALITATIVE  TROPONIN I (HIGH SENSITIVITY)  TROPONIN I (HIGH SENSITIVITY)    EKG EKG Interpretation Date/Time:  Tuesday April 26 2023 09:39:15 EDT Ventricular Rate:  83 PR Interval:  120 QRS Duration:  104 QT Interval:  350 QTC Calculation: 411 R Axis:   74  Text Interpretation: Normal sinus rhythm with sinus arrhythmia Normal ECG When compared with ECG of 04-Jan-2023 20:34, No significant change was found Confirmed by Ernie Avena ([id number]) on 04/26/2023 12:28:25 PM  Radiology CT Angio Chest PE W and/or Wo Contrast Result Date: 04/26/2023 CLINICAL DATA:  High probability for PE.  Chest pain. EXAM: CT ANGIOGRAPHY CHEST WITH CONTRAST TECHNIQUE: Multidetector CT imaging of the chest was performed using the standard protocol during bolus  administration of intravenous contrast. Multiplanar CT image reconstructions and MIPs were obtained to evaluate the vascular anatomy. RADIATION DOSE REDUCTION: This exam was performed according to the departmental dose-optimization program which includes automated exposure control, adjustment of the mA and/or kV according to patient size and/or use of iterative reconstruction technique. CONTRAST:  75mL OMNIPAQUE IOHEXOL 350 MG/ML SOLN COMPARISON:  None Available. FINDINGS: Cardiovascular: Heart is borderline enlarged. Aorta is normal in size. There is adequate opacification of the pulmonary arteries. There is no evidence for pulmonary embolism. Mediastinum/Nodes: No enlarged mediastinal, hilar, or axillary lymph nodes. Thyroid gland, trachea, and esophagus demonstrate no significant findings. Lungs/Pleura: Lungs are clear. No pleural  effusion or pneumothorax. Upper Abdomen: No acute abnormality. Cholecystectomy clips are present. Musculoskeletal: Bilateral breast implants are present. No evidence for acute fracture. Review of the MIP images confirms the above findings. IMPRESSION: 1. No evidence for pulmonary embolism. 2. No acute cardiopulmonary process. 3. Borderline cardiomegaly. Electronically Signed   By: Darliss Cheney M.D.   On: 04/26/2023 15:49   DG Chest 2 View Result Date: 04/26/2023 CLINICAL DATA:  Chest pain. EXAM: CHEST - 2 VIEW COMPARISON:  01/05/2023. FINDINGS: Bilateral lung fields are clear. Bilateral costophrenic angles are clear. Normal cardio-mediastinal silhouette. No acute osseous abnormalities. The soft tissues are within normal limits. IMPRESSION: No active cardiopulmonary disease. Electronically Signed   By: Jules Schick M.D.   On: 04/26/2023 11:58    Procedures Procedures    Medications Ordered in ED Medications  ondansetron (ZOFRAN) injection 4 mg (4 mg Intravenous Given 04/26/23 1116)  iohexol (OMNIPAQUE) 350 MG/ML injection 100 mL (75 mLs Intravenous Contrast Given 04/26/23  1344)  lactated ringers bolus 1,000 mL (0 mLs Intravenous Stopped 04/26/23 1533)  morphine (PF) 2 MG/ML injection 2 mg (2 mg Intravenous Given 04/26/23 1517)    ED Course/ Medical Decision Making/ A&P                                 Medical Decision Making Amount and/or Complexity of Data Reviewed Labs: ordered. Radiology: ordered.  Risk Prescription drug management.   This patient presents to the ED for concern of chest pain.  Differential diagnosis includes ACS, PE, pneumonia, bronchitis, aortic dissection   Lab Tests:  I Ordered, and personally interpreted labs.  The pertinent results include: CBC and CMP unremarkable, troponin negative at less than 2, lipase normal at 17   Imaging Studies ordered:  I ordered imaging studies including chest x-ray I independently visualized and interpreted imaging which showed no acute cardiopulmonary findings seen I agree with the radiologist interpretation   Medicines ordered and prescription drug management:  I ordered medication including Zofran for nausea Reevaluation of the patient after these medicines showed that the patient improved I have reviewed the patients home medicines and have made adjustments as needed   Problem List / ED Course:  Patient with past history significant for seizures, migraines, and a positive, GERD, stress, insomnia, PTSD presents to the ED today with concerns of chest pain.  She reports that over the last few days, has had chest pain with radiation from the anterior to the posterior aspect.  She reports the pain is primarily in the lower sternal chest.  She denies any feelings of nausea, vomiting, diaphoresis with onset of chest pain.  She does state that she is currently being worked up for lupus and has had positive any findings and is currently awaiting rheumatology evaluation.  She is not currently on any DMARDs.  No prior history of PE, DVT, ACS, or other acute cardiac abnormality. Physical exam is  reassuring with no abnormal heart or lung sounds heard.  There are some minor tenderness to palpation along the anterior chest wall at the lower sternal border.  There is no abdominal tenderness.  Will proceed with cardiac workup at this time including CBC, CMP, troponin, hCG, and lipase.  Chest x-ray and EKG also ordered for assessment. After discussion with attending, in the setting of patient having elevated risk for PE or aortic injury due to suspected lupus, will proceed with CT angio imaging to rule out these possible  causes of her current symptoms. Lab workup is reassuring and unremarkable.  Patient's troponin levels less than 2.  Lipase unremarkable.  No other acute or concerning findings seen.  EKG shows patient is in sinus rhythm and chest x-ray shows no acute cardiopulmonary process.  CT angio chest currently pending.  Patient reports improvement after administration of morphine and Zofran. CT angio chest is negative for any signs of PE or other cardiopulmonary findings.  Unclear at this time what is causing patient's chest pain.  Strongly advised patient to follow-up with primary care provider for repeat evaluation.  Also encourage patient to keep appoint with rheumatologist for continued workup of possible lupus.  Patient was stable at this time for outpatient follow-up and discharged home.  Final Clinical Impression(s) / ED Diagnoses Final diagnoses:  Other chest pain    Rx / DC Orders ED Discharge Orders     None         Smitty Knudsen, PA-C 04/26/23 1856    Ernie Avena, MD 04/27/23 [phone]

## 2023-04-26 NOTE — ED Triage Notes (Signed)
 Pt c/o "bad" CP with nausea over the weekend radiating to back. New dx with lupus, just waiting on confirmation

## 2023-04-26 NOTE — Telephone Encounter (Signed)
  Chief Complaint: chest pain Symptoms: chest pain and nausea Frequency: since Friday Pertinent Negatives: Patient denies Disposition: [x] ED /[] Urgent Care (no appt availability in office) / [] Appointment(In office/virtual)/ []  [facility]/ [] Home Care/ [] Refused Recommended Disposition /[] [facility]/ []  Follow-up with PCP Additional Notes: Pt states that she has been having chest pain since Friday. States that it comes and goes and last between 30 minutes to one hour. States pain is a 5/10 now. Pain is under both ribs cages in the mid to lower chest area and sometimes radiates to her abd and back. Patient states a strong family hx of heart disease. Patient states that she experiences nausea and difficulty breathing when having these pains.   Patient advised ED and states will get friend to take her as she has her daughter with her.    Chest pain x 2 days making nauseous,  Reason for Disposition  SEVERE chest pain  Answer Assessment - Initial Assessment Questions 1. LOCATION: "Where does it hurt?"       Mid to lower chest 2. RADIATION: "Does the pain go anywhere else?" (e.g., into neck, jaw, arms, back)     Upper abd and back 3. ONSET: "When did the chest pain begin?" (Minutes, hours or days)      friday 4. PATTERN: "Does the pain come and go, or has it been constant since it started?"  "Does it get worse with exertion?"      Comes and goes 5. DURATION: "How long does it last" (e.g., seconds, minutes, hours)     30 minutes to one hour 6. SEVERITY: "How bad is the pain?"  (e.g., Scale 1-10; mild, moderate, or severe)    - MILD (1-3): doesn't interfere with normal activities     - MODERATE (4-7): interferes with normal activities or awakens from sleep    - SEVERE (8-10): excruciating pain, unable to do any normal activities       5/10 7. CARDIAC RISK FACTORS: "Do you have any history of heart problems or risk factors for heart disease?" (e.g., angina, prior heart  attack; diabetes, high blood pressure, high cholesterol, smoker, or strong family history of heart disease)     no 8. PULMONARY RISK FACTORS: "Do you have any history of lung disease?"  (e.g., blood clots in lung, asthma, emphysema, birth control pills)     no 9. CAUSE: "What do you think is causing the chest pain?"     unknown 10. OTHER SYMPTOMS: "Do you have any other symptoms?" (e.g., dizziness, nausea, vomiting, sweating, fever, difficulty breathing, cough)       nausea  Protocols used: Chest Pain-A-AH

## 2023-04-28 ENCOUNTER — Ambulatory Visit: Payer: Medicare Other | Admitting: Physical Therapy

## 2023-04-28 ENCOUNTER — Encounter: Payer: Medicare Other | Admitting: Occupational Therapy

## 2023-04-29 ENCOUNTER — Telehealth: Admitting: Family Medicine

## 2023-05-02 ENCOUNTER — Ambulatory Visit (INDEPENDENT_AMBULATORY_CARE_PROVIDER_SITE_OTHER): Admitting: Family Medicine

## 2023-05-02 ENCOUNTER — Encounter: Payer: Self-pay | Admitting: Family Medicine

## 2023-05-02 VITALS — BP 99/61 | HR 88 | Temp 97.7°F | Ht 63.0 in

## 2023-05-02 DIAGNOSIS — F439 Reaction to severe stress, unspecified: Secondary | ICD-10-CM | POA: Diagnosis not present

## 2023-05-02 DIAGNOSIS — K219 Gastro-esophageal reflux disease without esophagitis: Secondary | ICD-10-CM | POA: Diagnosis not present

## 2023-05-02 DIAGNOSIS — R569 Unspecified convulsions: Secondary | ICD-10-CM

## 2023-05-02 DIAGNOSIS — G4700 Insomnia, unspecified: Secondary | ICD-10-CM

## 2023-05-02 DIAGNOSIS — I517 Cardiomegaly: Secondary | ICD-10-CM

## 2023-05-02 MED ORDER — ZOLPIDEM TARTRATE 5 MG PO TABS: 5.0000 mg | ORAL_TABLET | Freq: Every evening | ORAL | 1 refills | Status: DC | PRN

## 2023-05-02 MED ORDER — LEVETIRACETAM 250 MG PO TABS: 250.0000 mg | ORAL_TABLET | Freq: Two times a day (BID) | ORAL | 0 refills | Status: DC

## 2023-05-02 MED ORDER — PANTOPRAZOLE SODIUM 40 MG PO TBEC: 40.0000 mg | DELAYED_RELEASE_TABLET | Freq: Every day | ORAL | 3 refills | Status: DC

## 2023-05-02 NOTE — Assessment & Plan Note (Signed)
 She has been a lot of stress recently due to her own health as well as difficult social situations recently.  She has established with a therapist and this is going well.

## 2023-05-02 NOTE — Assessment & Plan Note (Signed)
 She has had recurrence and flareup of symptoms over the last several weeks.  She is on omeprazole and Pepcid per gastroenterology.  She did have an endoscopy performed several years ago with biopsies performed which showed inflammation and H. pylori.  It is possible that she may have developed a recurrent ulcer.  Her recent workup in the ED was negative as above.  Will switch her omeprazole to Protonix and refer to GI.

## 2023-05-02 NOTE — Assessment & Plan Note (Signed)
 We referred her to see neurology however she would not be able to see them for a couple of more months.  She is currently on Keppra 250 mg twice daily.  Will refill this today until she can get into see them.

## 2023-05-02 NOTE — Progress Notes (Signed)
 Jody MISHAWN Carter is a 28 y.o. female who presents today for an office visit.  Assessment/Plan:  New/Acute Problems: Atypical Chest Pain  Had a workup in the ED which was reassuring.  Her CTA chest incidentally did find cardiomegaly.  Do not think that this is contributing to her current symptoms however we will check an echocardiogram to further assess.  Depending on results of her echocardiogram may need referral to cardiology.  It is also possible that her symptoms may be GI related.  Will be switching her omeprazole to Protonix and referring to GI as below.  Chronic Problems Addressed Today: GERD (gastroesophageal reflux disease) She has had recurrence and flareup of symptoms over the last several weeks.  She is on omeprazole and Pepcid per gastroenterology.  She did have an endoscopy performed several years ago with biopsies performed which showed inflammation and H. pylori.  It is possible that she may have developed a recurrent ulcer.  Her recent workup in the ED was negative as above.  Will switch her omeprazole to Protonix and refer to GI.  Stress She has been a lot of stress recently due to her own health as well as difficult social situations recently.  She has established with a therapist and this is going well.  Insomnia Did not have any improvement with trazodone.  We discussed alternative treatment options.  Will try Ambien 5 mg nightly.  We discussed potential side effects.  She will follow-up with [place] in a few weeks.  If she does not do well with Ambien, would consider trial of amitriptyline or gabapentin.  Seizures (HCC) We referred her to see neurology however she would not be able to see them for a couple of more months.  She is currently on Keppra 250 mg twice daily.  Will refill this today until she can get into see them.     Subjective:  HPI:  See A/P for status of chronic conditions.  Patient is here today for ED follow-up.  She went to the ED 6 days ago with chest  pain.  While there she had extensive workup including EKG, labs, chest x-ray, and CT scan.  Her labs were all normal.  Lipase was normal.  EKG showed sinus rhythm.  Chest x-ray was normal.  Her CT angio showed borderline cardiomegaly but was negative for PE or any other acute cardiopulmonary process.  She was discharged home.  Over the last few days she is still having intermittent issues with upper abdomen and lower chest pain.  Symptoms have not changed significantly since her ED visit.  She does note that she has had intermittent issues with reflux and nausea as well.  She has also noticed more loose stools the last several days as well.  She does have a history of GERD and is following with GI for this.  She previously was on omeprazole, Pepcid, and Carafate.  She has been consistent with her omeprazole.  She does admit to being under quite a bit of stress due to social situations as well as her own health over the last few weeks.  She did establish with a therapist and this seems to be going well.  We saw her a few weeks ago and started on trazodone to help her with sleep.  Do not feel like this was effective and she would like to try alternatives for this.       Objective:  Physical Exam: BP 99/61   Pulse 88   Temp 97.7 F (  36.5 C) (Temporal)   Ht 5\' 3"  (1.6 m)   LMP 04/29/2023   SpO2 96%   BMI 23.49 kg/m   Gen: No acute distress, resting comfortably CV: Regular rate and rhythm with no murmurs appreciated Pulm: Normal work of breathing, clear to auscultation bilaterally with no crackles, wheezes, or rhonchi Neuro: Grossly normal, moves all extremities Psych: Normal affect and thought content  Time Spent: 45 minutes of total time was spent on the date of the encounter performing the following actions: chart review prior to seeing the patient including recent ED visit, obtaining history, performing a medically necessary exam, counseling on the treatment plan, placing orders, and  documenting in our EHR.        Katina Degree. Jimmey Ralph, MD 05/02/2023 12:08 PM

## 2023-05-02 NOTE — Patient Instructions (Signed)
 It was very nice to see you today!  I will refill your Keppra today.  Please try the Ambien to help you with sleeping.  Let me know in a few weeks how this is working for you.  I will also send in Protonix to help you with your gastric symptoms.  Will refer you to see the gastroenterologist.  We will also place an order for an echocardiogram.  We may need to send you to see cardiology depending on results.  Return if symptoms worsen or fail to improve.   Take care, Dr Jimmey Ralph  PLEASE NOTE:  If you had any lab tests, please let us know if you have not heard back within a few days. You may see your results on mychart before we have a chance to review them but we will give you a call once they are reviewed by [place].   If we ordered any referrals today, please let us know if you have not heard from their office within the next week.   If you had any urgent prescriptions sent in today, please check with the pharmacy within an hour of our visit to make sure the prescription was transmitted appropriately.   Please try these tips to maintain a healthy lifestyle:  Eat at least 3 REAL meals and 1-2 snacks per day.  Aim for no more than 5 hours between eating.  If you eat breakfast, please do so within one hour of getting up.   Each meal should contain half fruits/vegetables, one quarter protein, and one quarter carbs (no bigger than a computer mouse)  Cut down on sweet beverages. This includes juice, soda, and sweet tea.   Drink at least 1 glass of water with each meal and aim for at least 8 glasses per day  Exercise at least 150 minutes every week.

## 2023-05-02 NOTE — Assessment & Plan Note (Signed)
 Did not have any improvement with trazodone.  We discussed alternative treatment options.  Will try Ambien 5 mg nightly.  We discussed potential side effects.  She will follow-up with [place] in a few weeks.  If she does not do well with Ambien, would consider trial of amitriptyline or gabapentin.

## 2023-05-03 ENCOUNTER — Encounter: Payer: Medicare Other | Admitting: Obstetrics and Gynecology

## 2023-05-06 ENCOUNTER — Ambulatory Visit: Admitting: Licensed Clinical Social Worker

## 2023-05-18 ENCOUNTER — Ambulatory Visit

## 2023-05-24 ENCOUNTER — Other Ambulatory Visit: Payer: Self-pay | Admitting: Family Medicine

## 2023-05-24 ENCOUNTER — Encounter: Payer: Self-pay | Admitting: Gastroenterology

## 2023-05-24 ENCOUNTER — Encounter: Payer: Self-pay | Admitting: Family Medicine

## 2023-05-24 DIAGNOSIS — R609 Edema, unspecified: Secondary | ICD-10-CM

## 2023-05-24 NOTE — Telephone Encounter (Signed)
 See note

## 2023-05-24 NOTE — Telephone Encounter (Signed)
 I appreciate the update.  I am glad it is working well for her.  She should have a refill at the pharmacy-recommend she check with him.

## 2023-05-25 ENCOUNTER — Encounter (HOSPITAL_BASED_OUTPATIENT_CLINIC_OR_DEPARTMENT_OTHER): Payer: Self-pay

## 2023-05-25 ENCOUNTER — Other Ambulatory Visit: Payer: Self-pay | Admitting: *Deleted

## 2023-05-25 NOTE — Telephone Encounter (Signed)
 Refill send to PCP for approval

## 2023-05-26 MED ORDER — FUROSEMIDE 20 MG PO TABS: 20.0000 mg | ORAL_TABLET | Freq: Every day | ORAL | 0 refills | Status: DC

## 2023-06-16 ENCOUNTER — Encounter: Admitting: Obstetrics and Gynecology

## 2023-06-24 ENCOUNTER — Other Ambulatory Visit (HOSPITAL_BASED_OUTPATIENT_CLINIC_OR_DEPARTMENT_OTHER)

## 2023-06-24 ENCOUNTER — Other Ambulatory Visit: Payer: Self-pay | Admitting: *Deleted

## 2023-06-24 DIAGNOSIS — R609 Edema, unspecified: Secondary | ICD-10-CM

## 2023-06-24 MED ORDER — FUROSEMIDE 20 MG PO TABS: 20.0000 mg | ORAL_TABLET | Freq: Every day | ORAL | 0 refills | Status: DC

## 2023-06-29 ENCOUNTER — Ambulatory Visit

## 2023-07-13 ENCOUNTER — Encounter: Payer: Self-pay | Admitting: Family Medicine

## 2023-07-13 MED ORDER — ZOLPIDEM TARTRATE 5 MG PO TABS: 5.0000 mg | ORAL_TABLET | Freq: Every evening | ORAL | 1 refills | Status: DC | PRN

## 2023-07-13 NOTE — Telephone Encounter (Signed)
 Name of Medication: Ambien  5mg   Directions: Take 1 tablet (5 mg total) by mouth at bedtime as needed for sleep. Ordering Department: [facility] Authorized By: Rodney Clamp, MD DC Reason: Error Dispense: 15 tablet Refills: 1 ordered Name of Pharmacy: [facility] [id number] - [place], [place] - [address]  Last Office Visit and Type: 05/02/23  Next Office Visit and Type: 09/07/23 for annual Last Controlled Substance Agreement Date: none Last UDS: none

## 2023-07-14 ENCOUNTER — Other Ambulatory Visit: Payer: Self-pay | Admitting: Family Medicine

## 2023-07-14 MED ORDER — ZOLPIDEM TARTRATE 5 MG PO TABS: 5.0000 mg | ORAL_TABLET | Freq: Every evening | ORAL | 1 refills | Status: DC | PRN

## 2023-07-18 ENCOUNTER — Ambulatory Visit: Admitting: Gastroenterology

## 2023-07-18 ENCOUNTER — Encounter: Payer: Self-pay | Admitting: Gastroenterology

## 2023-07-18 VITALS — BP 104/80 | HR 74 | Ht 63.0 in | Wt 125.6 lb

## 2023-07-18 DIAGNOSIS — R143 Flatulence: Secondary | ICD-10-CM

## 2023-07-18 DIAGNOSIS — K625 Hemorrhage of anus and rectum: Secondary | ICD-10-CM

## 2023-07-18 DIAGNOSIS — Z8619 Personal history of other infectious and parasitic diseases: Secondary | ICD-10-CM

## 2023-07-18 DIAGNOSIS — K581 Irritable bowel syndrome with constipation: Secondary | ICD-10-CM | POA: Diagnosis not present

## 2023-07-18 DIAGNOSIS — R1084 Generalized abdominal pain: Secondary | ICD-10-CM

## 2023-07-18 DIAGNOSIS — R6881 Early satiety: Secondary | ICD-10-CM

## 2023-07-18 DIAGNOSIS — R140 Abdominal distension (gaseous): Secondary | ICD-10-CM

## 2023-07-18 DIAGNOSIS — R110 Nausea: Secondary | ICD-10-CM

## 2023-07-18 DIAGNOSIS — R1110 Vomiting, unspecified: Secondary | ICD-10-CM

## 2023-07-18 DIAGNOSIS — R112 Nausea with vomiting, unspecified: Secondary | ICD-10-CM

## 2023-07-18 DIAGNOSIS — K648 Other hemorrhoids: Secondary | ICD-10-CM | POA: Diagnosis not present

## 2023-07-18 MED ORDER — HYDROCORTISONE ACETATE 25 MG RE SUPP: 25.0000 mg | Freq: Every day | RECTAL | 1 refills | Status: DC

## 2023-07-18 MED ORDER — ONDANSETRON 4 MG PO TBDP: 4.0000 mg | ORAL_TABLET | Freq: Two times a day (BID) | ORAL | 1 refills | Status: AC | PRN

## 2023-07-18 NOTE — Progress Notes (Signed)
 Chief Complaint:GERD Primary GI Doctor: Dr. San   HPI:  Patient is a 28 year old female patient with past medical history of anxiety, depression,migraines, seizures, who was referred to me by Kennyth Worth HERO, MD on 05/02/23 for a complaint of GERD .    04/26/23 ED visit for lower sternal chest pain with some radiation towards the back.Patient's troponin levels less than 2. Lipase unremarkable. EKG shows patient is in sinus rhythm and chest x-ray shows no acute cardiopulmonary process. CT angio chest is negative for any signs of PE or other cardiopulmonary findings.   05/02/23 patient follow-up with PCP. Ordered echo,pending results Ady Heimann refer to cardiology.Will be switching her omeprazole  to Protonix  and referring to GI .   Interval History    Patient presents with several gastrointestinal complaints. She states she was diagnosed with IBS back when she was a teenager. She has seen multiple GI doctors over the years. She reports she is currently undergoing workup for possible lupus, pending evaluation with rheumatologist end of August.      Patient has history of IBS constipation and taking Linzess  290 mcg po daily. She states her stools are typically loose and fluffy with mucus. She uses stool softeners prn. At time she feels urge to have bowel movement without much result. She will have to assist with bowel movements at times by self disimpacting. She has generalized abdominal discomfort. She also admits to a lot of gas and bloat. No known food triggers. She admits stress Sophie Quiles be contributing factor. She has intermittent rectal bleeding from the internal hemorrhoids. She states the OTC topicals have not helped much.     Patient denies history of GERD but has episodes where she regurgitates food. She was switched from Omeprazole  Pantoprazole  without much help. She reports she has choked on food coming back up.  She eats very small meals and feels full quickly.   She reports she has attempted two  gastric emptying studies and unable to keep food down for test.   Very seldom drinks. Nonsmoker.  Family history : great grandmother with stomach CA, breast CA, paternal grandmother with Crohn's, mother with colonic polyps.  She did have an endoscopy performed several years ago with biopsies performed which showed inflammation and H. pylori.   Surgical history: tonsillectomy, tear duct, cholecystectomy   Last seizure was 9 months ago.  Wt Readings from Last 3 Encounters:  07/18/23 125 lb 9.6 oz (57 kg)  03/31/23 132 lb 9.6 oz (60.1 kg)  02/28/23 120 lb (54.4 kg)    Past Medical History:  Diagnosis Date   Anxiety    Depression    Encounter for supervision of normal pregnancy, antepartum 05/27/2021                 FAMILY TREE         RESULTS      Language    English    Pap           Initiated care at         GC/CT    Initial:            36wks:      Dating by    8wk US                 Support person         Genetics    NT/IT:     AFP:      Panorama:       BP cuff  Carrier Screen                     Helper/Hgb Elec           Rhogam                     TDaP vaccine         Blood Type              Fibromyalgia 2022   Hx of migraines    IBS (irritable bowel syndrome)    Panic disorder    Seizures (HCC)    Susceptible to varicella (non-immune), currently pregnant 07/03/2013   Thyroid  disease     Past Surgical History:  Procedure Laterality Date   ADENOIDECTOMY     BREAST SURGERY     augmentation   CHOLECYSTECTOMY     DILATION AND EVACUATION N/A 02/07/2019   Procedure: DILATATION AND EVACUATION;  Surgeon: Alger Gong, MD;  Location: [facility];  Service: Gynecology;  Laterality: N/A;   EYE SURGERY Bilateral    closed tear ducts opened as child   TONSILLECTOMY      Current Outpatient Medications  Medication Sig Dispense Refill   albuterol (VENTOLIN HFA) 108 (90 Base) MCG/ACT inhaler Inhale 2 puffs into the lungs.     amitriptyline (ELAVIL) 25 MG tablet Take  by mouth.     famotidine  (PEPCID ) 20 MG tablet Take 1 tablet (20 mg total) by mouth 2 (two) times daily. 30 tablet 0   fluocinonide (LIDEX) 0.05 % external solution SMARTSIG:10-15 Drop(s) Topical Every Night     furosemide  (LASIX ) 20 MG tablet Take 1 tablet (20 mg total) by mouth daily. 30 tablet 0   hydrocortisone (ANUSOL-HC) 25 MG suppository Place 1 suppository (25 mg total) rectally at bedtime. 12 suppository 1   ketoconazole (NIZORAL) 2 % shampoo SMARTSIG:Topical 4 Times a Week     levETIRAcetam  (KEPPRA ) 250 MG tablet Take 1 tablet (250 mg total) by mouth 2 (two) times daily. 180 tablet 0   levothyroxine  (SYNTHROID , LEVOTHROID) 88 MCG tablet Take 1 tablet (88 mcg total) by mouth daily before breakfast. For hypothyroidism 30 tablet 0   linaclotide  (LINZESS ) 290 MCG CAPS capsule Take 1 capsule (290 mcg total) by mouth daily before breakfast. 90 capsule 3   meloxicam  (MOBIC ) 15 MG tablet Take 1 tablet (15 mg total) by mouth daily. 30 tablet 0   omeprazole  (PRILOSEC) 40 MG capsule Take 40 mg by mouth daily.     ondansetron  (ZOFRAN -ODT) 4 MG disintegrating tablet Take 1 tablet (4 mg total) by mouth every 8 (eight) hours as needed for nausea or vomiting. 12 tablet 0   pantoprazole  (PROTONIX ) 40 MG tablet Take 1 tablet (40 mg total) by mouth daily. 30 tablet 3   promethazine  (PHENERGAN ) 25 MG tablet Take 1 tablet (25 mg total) by mouth every 6 (six) hours as needed for nausea or vomiting. 30 tablet 0   spironolactone (ALDACTONE) 50 MG tablet Take by mouth.     sucralfate  (CARAFATE ) 1 g tablet Take 1 tablet (1 g total) by mouth 4 (four) times daily as needed. 30 tablet 0   Vitamin D , Ergocalciferol , (DRISDOL) 1.25 MG (50000 UNIT) CAPS capsule Take 50,000 Units by mouth every 7 (seven) days.     zolpidem  (AMBIEN ) 5 MG tablet Take 1 tablet (5 mg total) by mouth at bedtime as needed for sleep. 15 tablet 1   No current facility-administered medications for this visit.  Allergies as of 07/18/2023 -  Review Complete 07/18/2023  Allergen Reaction Noted   Sumatriptan  Other (See Comments) 08/08/2015    Family History  Problem Relation Age of Onset   Hypertension Mother    Colon polyps Mother    Heart attack Father    Drug abuse Father    Hypertension Maternal Grandmother    Heart attack Maternal Grandmother    Hypertension Maternal Grandfather    Heart attack Maternal Grandfather    Diabetes Paternal Grandfather    Colon cancer Other    Breast cancer Paternal Great-grandmother    Stomach cancer Paternal Aunt     Review of Systems:    Constitutional: No weight loss, fever, chills, weakness or fatigue HEENT: Eyes: No change in vision               Ears, Nose, Throat:  No change in hearing or congestion Skin: No rash or itching Cardiovascular: No chest pain, chest pressure or palpitations   Respiratory: No SOB or cough Gastrointestinal: See HPI and otherwise negative Genitourinary: No dysuria or change in urinary frequency Neurological: No headache, dizziness or syncope Musculoskeletal: No new muscle or joint pain Hematologic: No bleeding or bruising Psychiatric: No history of depression or anxiety    Physical Exam:  Vital signs: BP 104/80   Pulse 74   Ht 5' 3 (1.6 m)   Wt 125 lb 9.6 oz (57 kg)   LMP 07/12/2023 (Approximate)   BMI 22.25 kg/m   Constitutional:   Pleasant female appears to be in NAD, Well developed, Well nourished, alert and cooperative Throat: Oral cavity and pharynx without inflammation, swelling or lesion.  Respiratory: Respirations even and unlabored. Lungs clear to auscultation bilaterally.   No wheezes, crackles, or rhonchi.  Cardiovascular: Normal S1, S2. Regular rate and rhythm. No peripheral edema, cyanosis or pallor.  Gastrointestinal:  Soft, nondistended, generalized abd tenderness. No rebound or guarding. Hypoactive bowel sounds. No appreciable masses or hepatomegaly. Rectal:  Not performed.  Msk:  Symmetrical without gross deformities.  Without edema, no deformity or joint abnormality.  Neurologic:  Alert and  oriented x4;  grossly normal neurologically.  Skin:   Dry and intact without significant lesions or rashes. Psychiatric: Oriented to person, place and time. Demonstrates good judgement and reason without abnormal affect or behaviors.  RELEVANT LABS AND IMAGING: CBC    Latest Ref Rng & Units 04/26/2023   10:13 AM 03/31/2023   11:33 AM 02/28/2023   12:03 PM  CBC  WBC 4.0 - 10.5 K/uL 7.5  14.8  16.3   Hemoglobin 12.0 - 15.0 g/dL 88.2  87.2  87.9   Hematocrit 36.0 - 46.0 % 35.2  38.9  36.3   Platelets 150 - 400 K/uL 318  362.0  330.0      CMP     Latest Ref Rng & Units 04/26/2023   10:13 AM 03/31/2023   11:33 AM 02/28/2023   12:03 PM  CMP  Glucose 70 - 99 mg/dL 95  83  96   BUN 6 - 20 mg/dL 14  16  12    Creatinine 0.44 - 1.00 mg/dL 9.55  9.37  9.45   Sodium 135 - 145 mmol/L 139  141  135   Potassium 3.5 - 5.1 mmol/L 4.3  3.3  3.5   Chloride 98 - 111 mmol/L 110  105  106   CO2 22 - 32 mmol/L 23  28  23    Calcium  8.9 - 10.3 mg/dL 8.4  8.8  8.3  Total Protein 6.5 - 8.1 g/dL 5.9  6.2  6.0   Total Bilirubin 0.0 - 1.2 mg/dL 0.2  0.3  0.5   Alkaline Phos 38 - 126 U/L 51  55  54   AST 15 - 41 U/L 10  12  15    ALT 0 - 44 U/L 12  12  9       Lab Results  Component Value Date   TSH 3.04 03/31/2023  10/02/2020 EGD/colonoscopy at [facility] IMPRESSIONS and PLAN:  Mild antral gastritis otherwise normal upper endoscopy  Normal colon mucosa and terminal ileum  Small internal hemorrhoids. Etiology of rectal bleeding  No pathology found on EGD or colonoscopy to explain the patient's abdominal pain.  Recall colonoscopy age 12 for screening purposes  Increase amitriptyline to 50mg  QHS    04/26/23 CT Angio for chest pain IMPRESSION: 1. No evidence for pulmonary embolism. 2. No acute cardiopulmonary process. 3. Borderline cardiomegaly. 01/05/23 CT Abd/pelvis IMPRESSION: Right ovarian simple-appearing cyst measuring  3.7 cm. No follow-up imaging is recommended. 11/27/22 CT abd/pelvis IMPRESSION: No acute findings in the abdomen or pelvis. 04/08/22 CT abd/pelvis IMPRESSION: 1. Prominent fluid-filled loops of distal small bowel with gas fluid levels throughout the colon, as can be seen with enteritis and diarrheal illness. 2. Status post cholecystectomy there is new intra and extrahepatic biliary ductal dilation with the common duct measuring up to 12 mm. Possibly reflecting reservoir effect post cholecystectomy suggest correlation with laboratory values (T bili) to exclude biliary obstruction. If laboratory and clinical picture are concerning for biliary obstruction further evaluation with pre and postcontrast enhanced MRCP is recommended. 3. Ill-defined hypodensity in the medial left lobe and caudate lobe of the liver, which is nonspecific but favored to reflect focal fatty infiltration. Consider attention on aforementioned MRCP if obtained otherwise further evaluation with nonemergent hepatic protocol MRI with and without contrast. 4. Wall thickening versus underdistention of the gastric antrum, correlate for gastritis. 5. Trace pelvic free fluid, likely physiologic. Assessment: Encounter Diagnoses  Name Primary?   Irritable bowel syndrome with constipation    Generalized abdominal pain    Internal hemorrhoids    Rectal bleeding    Bloating    Flatulence    Nausea without vomiting Yes   Regurgitation of food    History of Helicobacter pylori infection    Early satiety      28 year old female patient with several gastrointestinal complaints that I suspect are related to motility disorder. She is in agreement to try the gastric emptying study again. She also has history of H. Pylori and would like to test for eradication. She can use antiemetics as needed. She also has IBS-C and her bowels are not regulated which could also cause bloating, discomfort, and nausea. Will give her samples of Ibsrela  50mg  twice daily. Another option Joniece Smotherman be Motegrity. She has had multiple imaging studies in past year that were unremarkable. Colon 2022 that was normal. For the gas bloat and altered bowel habits we discussed testing for Sibo, out of kits will reevaluate at follow-up. For the internal hemorrhoids we discussed high fiber diet and no straining. She would like to try the Anusol suppositories initially and if that does not help we discussed hemorrhoid banding as another alternative.   Plan: -  Order H pylori diathereix stool test - Order 4 hour gastric emptying test , take antiemetics prior -Continue Pantoprazole  40mg  po daily - Order Sibo test , out of kits -Can use antiemetics as needed -Samples of  Ibsrela 50 mg twice daily  -RX for Anusol 1 suppository at bedtime, If no improvement can consider hemorrhoid banding.  Thank you for the courtesy of this consult. Please call me with any questions or concerns.   Rama Mcclintock, FNP-C [facility] 07/18/2023, 4:23 PM  Cc: Kennyth Worth HERO, MD

## 2023-07-18 NOTE — Patient Instructions (Signed)
 You have been scheduled for a gastric emptying scan at [facility] on 08/09/23 at 8:00am. Please arrive at least 30 minutes prior to your appointment for registration. Please make certain not to have anything to eat or drink after midnight the night before your test. Hold all GI medications (ex: Zofran , phenergan , Reglan ) 8 hours prior to your test. If you need to reschedule your appointment, please contact radiology scheduling at [phone]. _____________________________________________________________________ A gastric-emptying study measures how long it takes for food to move through your stomach. There are several ways to measure stomach emptying. In the most common test, you eat food that contains a small amount of radioactive material. A scanner that detects the movement of the radioactive material is placed over your abdomen to monitor the rate at which food leaves your stomach. This test normally takes about 4 hours to complete. _____________________________________________________________________  Your provider has ordered Diatherix stool testing for you. You have received a kit from our office today containing all necessary supplies to complete this test. Please carefully read the stool collection instructions provided in the kit before opening the accompanying materials. In addition, be sure there is a label providing your full name and date of birth on the puritan opti-swab tube that is supplied in the kit (if you do not see a label with this information on your test tube, please make us  aware before test collection!). After completing the test, you should secure the purtian tube into the specimen biohazard bag. The [facility] E-Req sheet (including date and time of specimen collection) should be placed into the outside pocket of the specimen biohazard bag and returned to the [place] lab (basement floor of [facility]) within 3 days of collection. Please  make sure to give the specimen to a staff member at the lab. DO NOT leave the specimen on the counter.   If the specimen date and time (can be found in the upper right boxed portion of the sheet) are not filled out on the E-Req sheet, the test will NOT be performed.    _______________________________________________________  If your blood pressure at your visit was 140/90 or greater, please contact your primary care physician to follow up on this.  _______________________________________________________  If you are age 20 or older, your body mass index should be between 23-30. Your Body mass index is 22.25 kg/m. If this is out of the aforementioned range listed, please consider follow up with your Primary Care Provider.  If you are age 44 or younger, your body mass index should be between 19-25. Your Body mass index is 22.25 kg/m. If this is out of the aformentioned range listed, please consider follow up with your Primary Care Provider.   ________________________________________________________  The [facility] providers would like to encourage you to use MYCHART to communicate with providers for non-urgent requests or questions.  Due to long hold times on the telephone, sending your provider a message by [facility] may be a faster and more efficient way to get a response.  Please allow 48 business hours for a response.  Please remember that this is for non-urgent requests.  _______________________________________________________  Thank you for trusting me with your gastrointestinal care. Deanna May, RNP

## 2023-07-22 ENCOUNTER — Telehealth: Admitting: Family Medicine

## 2023-07-22 DIAGNOSIS — T22211A Burn of second degree of right forearm, initial encounter: Secondary | ICD-10-CM | POA: Diagnosis not present

## 2023-07-22 MED ORDER — MUPIROCIN 2 % EX OINT: 1.0000 | TOPICAL_OINTMENT | Freq: Two times a day (BID) | CUTANEOUS | 0 refills | Status: AC

## 2023-07-22 MED ORDER — CEPHALEXIN 500 MG PO CAPS: 500.0000 mg | ORAL_CAPSULE | Freq: Three times a day (TID) | ORAL | 0 refills | Status: AC

## 2023-07-22 NOTE — Patient Instructions (Signed)
 Second-Degree Burn, Adult A second-degree burn, or partial thickness wound, is a burn that affects the top two layers of skin. These layers are called the epidermis and dermis. What are the causes? A second-degree burn may be caused by: Heat. This may be from a flame or hot liquid. Radiation. This may be from sunlight or cancer treatments. Electricity. This may be from a lightning strike or touching an outlet or power line. Certain chemicals, such as acids. Some chemicals can go through clothing. What increases the risk? You may be more likely to get a second-degree burn if: You are around a lot of open flames, chemicals, or electricity. You have cancer and are being treated with radiation. What are the signs or symptoms? Symptoms of a second-degree burn include: Severe pain. Skin changes. Your skin may turn deep red or blister. It may also be tender, swollen, blotchy, or shiny. How is this diagnosed? A second-degree burn can be diagnosed with a physical exam. Your health care provider may remove blistered skin during the exam. It may take a few days to see how bad the burn is. You may be told to watch the burn for changes at home. You may also need to visit a provider to have it checked again. How is this treated? Treatment depends on how bad the burn is and what caused it. Treatment may include: Cooling the burn. This may be done with cool water or with a cold, wet cloth (cold compress). Do not put ice on the burn. Taking or applying antibiotics, ointments, or other medicines. Getting a tetanus shot. Covering the burn with a bandage (dressing). Putting pressure (compression) dressings on the burn. Removing dead skin. This is called debridement. It is done by a provider. Do not try to remove dead skin yourself. If your burn needs to be treated in a hospital, you may need: Surgery to remove dead skin or scabs. Fluids and nutrition given through an IV. Oxygen given through a mask or a  machine (ventilator). Close monitoring to make sure blood can flow around the wound. Follow these instructions at home: Medicines Take or apply over-the-counter and prescription medicines only as told by your provider. If you were prescribed antibiotics or ointments, take or apply them as told by your provider. Do not stop using the antibiotic even if you start to feel better. Eating and drinking Eat healthy foods. Eat a lot of protein. This will help your burn heal. Drink enough fluid to keep your pee (urine) pale yellow. Wound care  Follow instructions from your provider about how to take care of your wound. Make sure you: Wash your hands with soap and water for at least 20 seconds before and after you change your dressing. If soap and water are not available, use hand sanitizer. Change your dressing as told by your provider. If you have a compression dressing, wear it as told by your provider. Clean your wound 2 times a day, or as told by your provider. Wash it with mild soap and water. Rinse it with water to remove all soap. Pat it dry with a towel. Do not rub it. Protect your wound from the sun. Preventing infection Do not scratch or pick at your wound. Do not break blisters or peel any skin. Do not rub your wound. Check your wound every day for signs of infection. Check for: More redness, swelling, or pain. Fluid or blood. Warmth. Pus or a bad smell. Activity Rest as told by your provider. Do not  exercise until your provider says that you can. Do exercises as told by your provider. General instructions Do not take baths, swim, or use a hot tub until your provider approves. Ask your provider if you may take showers. You may only be allowed to take sponge baths. Do not use any products that contain nicotine or tobacco. These products include cigarettes, chewing tobacco, and vaping devices, such as e-cigarettes. These can delay healing. If you need help quitting, ask your  provider. If you can, raise (elevate) the burned area above the level of your heart while sitting or lying down. Keep all follow-up visits. Your provider will check how your burn is healing. How is this prevented? Set your water heater to 120F (49C) or lower. Make sure you know how to get out of your home in case of a fire. Install smoke alarms in your home. Check them often to make sure they are working. Contact a health care provider if: Your symptoms do not get better with treatment. Your pain does not get better with medicine. Your wound has signs of infection. You have a fever or chills. Get help right away if: You get red streaks near the wound. You develop severe pain. This information is not intended to replace advice given to you by your health care provider. Make sure you discuss any questions you have with your health care provider. Document Revised: 02/01/2022 Document Reviewed: 12/22/2021 Elsevier Patient Education  2024 [employer].
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
## 2023-07-25 ENCOUNTER — Ambulatory Visit (HOSPITAL_BASED_OUTPATIENT_CLINIC_OR_DEPARTMENT_OTHER)

## 2023-07-25 DIAGNOSIS — I517 Cardiomegaly: Secondary | ICD-10-CM | POA: Diagnosis not present

## 2023-07-25 LAB — ECHOCARDIOGRAM COMPLETE
Area-P 1/2: 3.68 cm2
S' Lateral: 3.11 cm

## 2023-07-27 ENCOUNTER — Ambulatory Visit: Payer: Self-pay | Admitting: Family Medicine

## 2023-07-27 NOTE — Progress Notes (Signed)
 Her echocardiogram is normal.  No signs of enlarged heart or other abnormalities.  We do not need to do any other testing for this at this time.

## 2023-08-01 ENCOUNTER — Other Ambulatory Visit (HOSPITAL_COMMUNITY): Payer: Self-pay

## 2023-08-01 ENCOUNTER — Telehealth: Payer: Self-pay

## 2023-08-01 MED ORDER — IBSRELA 50 MG PO TABS: 50.0000 mg | ORAL_TABLET | Freq: Two times a day (BID) | ORAL | 2 refills | Status: DC

## 2023-08-01 NOTE — Telephone Encounter (Signed)
 Pharmacy Patient Advocate Encounter  Received notification from [facility] Medicare that Prior Authorization for Ibsrela  50MG  tablets has been APPROVED from 07-18-2023 to 01-24-2098   PA #/Case ID/Reference #: [id number]

## 2023-08-01 NOTE — Telephone Encounter (Signed)
 PA request has been Submitted. New Encounter has been or will be created for follow up. For additional info see Pharmacy Prior Auth telephone encounter from 08-01-2023.

## 2023-08-01 NOTE — Telephone Encounter (Signed)
 Pharmacy Patient Advocate Encounter   Received notification from Patient Advice Request messages that prior authorization for Ibsrela  50MG  tablets is required/requested.   Insurance verification completed.   The patient is insured through [facility] .   Per test claim: PA required; PA submitted to above mentioned insurance via CoverMyMeds Key/confirmation #/EOC [id number] Status is pending

## 2023-08-01 NOTE — Telephone Encounter (Signed)
 Please submit PA for IBSrela  50 mg BID. thanks

## 2023-08-09 ENCOUNTER — Ambulatory Visit (HOSPITAL_COMMUNITY): Admission: RE | Admit: 2023-08-09 | Source: Ambulatory Visit

## 2023-08-10 ENCOUNTER — Other Ambulatory Visit: Payer: Self-pay | Admitting: *Deleted

## 2023-08-10 MED ORDER — LEVETIRACETAM 250 MG PO TABS: 250.0000 mg | ORAL_TABLET | Freq: Two times a day (BID) | ORAL | 0 refills | Status: DC

## 2023-08-23 ENCOUNTER — Other Ambulatory Visit: Payer: Self-pay | Admitting: *Deleted

## 2023-08-23 DIAGNOSIS — R609 Edema, unspecified: Secondary | ICD-10-CM

## 2023-08-23 MED ORDER — FUROSEMIDE 20 MG PO TABS: 20.0000 mg | ORAL_TABLET | Freq: Every day | ORAL | 0 refills | Status: DC

## 2023-08-23 MED ORDER — PANTOPRAZOLE SODIUM 40 MG PO TBEC: 40.0000 mg | DELAYED_RELEASE_TABLET | Freq: Every day | ORAL | 3 refills | Status: DC
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
## 2023-08-24 ENCOUNTER — Ambulatory Visit (INDEPENDENT_AMBULATORY_CARE_PROVIDER_SITE_OTHER): Admitting: Neurology

## 2023-08-24 ENCOUNTER — Encounter: Payer: Self-pay | Admitting: Neurology

## 2023-08-24 VITALS — BP 92/65 | HR 91 | Ht 62.0 in | Wt 120.0 lb

## 2023-08-24 DIAGNOSIS — G43109 Migraine with aura, not intractable, without status migrainosus: Secondary | ICD-10-CM

## 2023-08-24 DIAGNOSIS — R202 Paresthesia of skin: Secondary | ICD-10-CM | POA: Diagnosis not present

## 2023-08-24 DIAGNOSIS — F445 Conversion disorder with seizures or convulsions: Secondary | ICD-10-CM | POA: Diagnosis not present

## 2023-08-24 DIAGNOSIS — R4189 Other symptoms and signs involving cognitive functions and awareness: Secondary | ICD-10-CM

## 2023-08-24 DIAGNOSIS — G8929 Other chronic pain: Secondary | ICD-10-CM | POA: Diagnosis not present

## 2023-08-24 DIAGNOSIS — R251 Tremor, unspecified: Secondary | ICD-10-CM

## 2023-08-24 MED ORDER — ZOLPIDEM TARTRATE 5 MG PO TABS: 5.0000 mg | ORAL_TABLET | Freq: Every evening | ORAL | 1 refills | Status: DC | PRN

## 2023-08-24 MED ORDER — AIMOVIG 140 MG/ML ~~LOC~~ SOAJ
140.0000 mg | SUBCUTANEOUS | 11 refills | Status: DC
Start: 2023-08-24 — End: 2023-09-30

## 2023-08-24 NOTE — Patient Instructions (Signed)
 Start Aimovig  injection every 28 days - give me update after 3 months Take Nurtec one daily as needed for migraine attack.  Use Zofran  or promethazine  for nausea Check nerve study of right arm and leg to evaluate for neuropathy Will get notes from Dr. Leigh

## 2023-08-24 NOTE — Progress Notes (Unsigned)
 Medication Samples have been provided to the patient.  Drug name: Nurtec       Strength: 75 mg        Qty: 2  LOT: [id number]  Exp.Date: =7/28  Dosing instructions: as needed  The patient has been instructed regarding the correct time, dose, and frequency of taking this medication, including desired effects and most common side effects.   Jody Carter 10:20 AM 08/24/2023

## 2023-08-25 ENCOUNTER — Encounter: Payer: Self-pay | Admitting: Neurology
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
## 2023-09-07 ENCOUNTER — Ambulatory Visit

## 2023-09-14 ENCOUNTER — Telehealth: Payer: Self-pay | Admitting: Family Medicine

## 2023-09-14 NOTE — Telephone Encounter (Signed)
 Spoke w/patient to confirm AWVI w/Tina, NHA  patient stated she has been having severe pain, migraines, just not feeling well at all with her issues.. She has her AWVI tomorrow by phone and will also let Ellouise be aware to let Dr Kennyth know the patient is experiencing a lot of physical issues.    Just FYI    Darice FORBES [place] [facility] AWV TEAM Direct Dial [phone]

## 2023-09-15 ENCOUNTER — Other Ambulatory Visit: Payer: Self-pay | Admitting: Family Medicine

## 2023-09-15 ENCOUNTER — Ambulatory Visit (INDEPENDENT_AMBULATORY_CARE_PROVIDER_SITE_OTHER)

## 2023-09-15 VITALS — Ht 63.0 in | Wt 120.0 lb

## 2023-09-15 DIAGNOSIS — I517 Cardiomegaly: Secondary | ICD-10-CM

## 2023-09-15 DIAGNOSIS — Z0000 Encounter for general adult medical examination without abnormal findings: Secondary | ICD-10-CM

## 2023-09-15 NOTE — Progress Notes (Signed)
 Subjective:   Jody Carter is a 28 y.o. who presents for a Medicare Wellness preventive visit.  As a reminder, Annual Wellness Visits don't include a physical exam, and some assessments may be limited, especially if this visit is performed virtually. We may recommend an in-person follow-up visit with your provider if needed.  Visit Complete: Virtual I connected with  Sherrilee LITTIE Ferries on 09/15/23 by a audio enabled telemedicine application and verified that I am speaking with the correct person using two identifiers.  Patient Location: Home  Provider Location: Office/Clinic  I discussed the limitations of evaluation and management by telemedicine. The patient expressed understanding and agreed to proceed.  Vital Signs: Because this visit was a virtual/telehealth visit, some criteria may be missing or patient reported. Any vitals not documented were not able to be obtained and vitals that have been documented are patient reported.  VideoDeclined- This patient declined Librarian, academic. Therefore the visit was completed with audio only.  Persons Participating in Visit: Patient.  AWV Questionnaire: No: Patient Medicare AWV questionnaire was not completed prior to this visit.  Cardiac Risk Factors include: sedentary lifestyle     Objective:    Today's Vitals   09/15/23 1007  Weight: 120 lb (54.4 kg)  Height: 5' 3 (1.6 m)  PainSc: 6    Body mass index is 21.26 kg/m.     09/15/2023   10:18 AM 04/26/2023    9:40 AM 03/15/2023   10:21 AM 01/04/2023    8:33 PM 11/27/2022    8:30 PM 04/08/2022    3:32 PM 04/16/2021    4:45 PM  Advanced Directives  Does Patient Have a Medical Advance Directive? No No No No No No No  Would patient like information on creating a medical advance directive? No - Patient declined  No - Patient declined  No - Patient declined  No - Patient declined    Current Medications (verified) Outpatient Encounter Medications as of  09/15/2023  Medication Sig   albuterol (VENTOLIN HFA) 108 (90 Base) MCG/ACT inhaler Inhale 2 puffs into the lungs.   amitriptyline (ELAVIL) 25 MG tablet Take by mouth.   fluocinonide (LIDEX) 0.05 % external solution SMARTSIG:10-15 Drop(s) Topical Every Night   furosemide  (LASIX ) 20 MG tablet Take 1 tablet (20 mg total) by mouth daily.   hydrocortisone  (ANUSOL -HC) 25 MG suppository Place 1 suppository (25 mg total) rectally at bedtime.   ketoconazole (NIZORAL) 2 % shampoo SMARTSIG:Topical 4 Times a Week   levETIRAcetam  (KEPPRA ) 250 MG tablet Take 1 tablet (250 mg total) by mouth 2 (two) times daily.   levothyroxine  (SYNTHROID , LEVOTHROID) 88 MCG tablet Take 1 tablet (88 mcg total) by mouth daily before breakfast. For hypothyroidism   meloxicam  (MOBIC ) 15 MG tablet Take 1 tablet (15 mg total) by mouth daily.   ondansetron  (ZOFRAN -ODT) 4 MG disintegrating tablet Take 1 tablet (4 mg total) by mouth every 12 (twelve) hours as needed for nausea or vomiting.   pantoprazole  (PROTONIX ) 40 MG tablet Take 1 tablet (40 mg total) by mouth daily.   promethazine  (PHENERGAN ) 25 MG tablet Take 1 tablet (25 mg total) by mouth every 6 (six) hours as needed for nausea or vomiting.   spironolactone (ALDACTONE) 50 MG tablet Take by mouth.   Tenapanor HCl (IBSRELA ) 50 MG TABS Take 50 mg by mouth 2 (two) times daily before a meal.   Vitamin D , Ergocalciferol , (DRISDOL) 1.25 MG (50000 UNIT) CAPS capsule Take 50,000 Units by mouth every 7 (seven)  days.   zolpidem  (AMBIEN ) 5 MG tablet Take 1 tablet (5 mg total) by mouth at bedtime as needed for sleep.   Erenumab -aooe (AIMOVIG ) 140 MG/ML SOAJ Inject 140 mg into the skin every 28 (twenty-eight) days. (Patient not taking: Reported on 09/15/2023)   No facility-administered encounter medications on file as of 09/15/2023.    Allergies (verified) Sumatriptan    History: Past Medical History:  Diagnosis Date   Anxiety    Depression    Encounter for supervision of normal  pregnancy, antepartum 05/27/2021                 FAMILY TREE         RESULTS      Language    English    Pap           Initiated care at         [facility]    Initial:            36wks:      Dating by    8wk US                 Support person         Genetics    NT/IT:     AFP:      Panorama:       BP cuff         Carrier Screen                     Axtell/Hgb Elec           Rhogam                     TDaP vaccine         Blood Type              Fibromyalgia 2022   Hx of migraines    IBS (irritable bowel syndrome)    Panic disorder    Seizures (HCC)    Susceptible to varicella (non-immune), currently pregnant 07/03/2013   Thyroid  disease    Past Surgical History:  Procedure Laterality Date   ADENOIDECTOMY     BREAST SURGERY     augmentation   CHOLECYSTECTOMY     DILATION AND EVACUATION N/A 02/07/2019   Procedure: DILATATION AND EVACUATION;  Surgeon: Alger Gong, MD;  Location: [facility];  Service: Gynecology;  Laterality: N/A;   EYE SURGERY Bilateral    closed tear ducts opened as child   TONSILLECTOMY     Family History  Problem Relation Age of Onset   Hypertension Mother    Colon polyps Mother    Heart attack Father    Drug abuse Father    Stomach cancer Paternal Aunt    Dementia Maternal Grandmother    Hypertension Maternal Grandmother    Heart attack Maternal Grandmother    Hypertension Maternal Grandfather    Heart attack Maternal Grandfather    Diabetes Paternal Grandfather    Breast cancer Paternal Great-grandmother    Colon cancer Other    Social History   Socioeconomic History   Marital status: Significant Other    Spouse name: Not on file   Number of children: 1   Years of education: Not on file   Highest education level: Not on file  Occupational History   Occupation: unemployed  Tobacco Use   Smoking status: Former    Types: E-cigarettes   Smokeless tobacco: Never  Building services engineer  status: Former   Substances: Nicotine, CBD  Substance and  Sexual Activity   Alcohol use: Not Currently    Comment: occ   Drug use: No   Sexual activity: Yes    Birth control/protection: None  Other Topics Concern   Not on file  Social History Narrative   Right handed   Social Drivers of Health   Financial Resource Strain: Low Risk  (09/15/2023)   Overall Financial Resource Strain (CARDIA)    Difficulty of Paying Living Expenses: Not hard at all  Food Insecurity: No Food Insecurity (09/15/2023)   Hunger Vital Sign    Worried About Running Out of Food in the Last Year: Never true    Ran Out of Food in the Last Year: Never true  Transportation Needs: No Transportation Needs (09/15/2023)   PRAPARE - Administrator, Civil Service (Medical): No    Lack of Transportation (Non-Medical): No  Physical Activity: Inactive (09/15/2023)   Exercise Vital Sign    Days of Exercise per Week: 0 days    Minutes of Exercise per Session: 0 min  Stress: Stress Concern Present (09/15/2023)   [employer] of Occupational Health - Occupational Stress Questionnaire    Feeling of Stress: To some extent  Social Connections: Moderately Isolated (09/15/2023)   Social Connection and Isolation Panel    Frequency of Communication with Friends and Family: Twice a week    Frequency of Social Gatherings with Friends and Family: Once a week    Attends Religious Services: 1 to 4 times per year    Active Member of [employer] or Organizations: No    Attends Engineer, structural: Never    Marital Status: Never married    Tobacco Counseling Counseling given: Not Answered    Clinical Intake:  Pre-visit preparation completed: Yes  Pain : 0-10 Pain Score: 6  Pain Type: Chronic pain Pain Location: Generalized Pain Descriptors / Indicators: Aching Pain Onset: More than a month ago Pain Frequency: Intermittent     BMI - recorded: 21.26 Nutritional Status: BMI of 19-24  Normal Diabetes: No  Lab Results  Component Value Date   HGBA1C 4.6 (L)  04/13/2017     How often do you need to have someone help you when you read instructions, pamphlets, or other written materials from your doctor or pharmacy?: 1 - Never  Interpreter Needed?: No  Information entered by :: Ellouise Haws, LPN   Activities of Daily Living     09/15/2023   10:09 AM  In your present state of health, do you have any difficulty performing the following activities:  Hearing? 1  Comment tinnitus  Vision? 0  Difficulty concentrating or making decisions? 1  Comment set reminders for medication  Walking or climbing stairs? 1  Comment at times  Dressing or bathing? 0  Doing errands, shopping? 0  Preparing Food and eating ? Y  Comment atimes of flare up  Using the Toilet? N  In the past six months, have you accidently leaked urine? N  Do you have problems with loss of bowel control? Y  Comment at times a night  Managing your Medications? N  Managing your Finances? N  Housekeeping or managing your Housekeeping? Y  Comment at times with flare ups    Patient Care Team: Kennyth Worth HERO, MD as PCP - General (Family Medicine) Skeet Juliene SAUNDERS, DO as Consulting Physician (Neurology)  I have updated your Care Teams any recent Medical Services you may have received from  other providers in the past year.     Assessment:   This is a routine wellness examination for Kyleah.  Hearing/Vision screen Hearing Screening - Comments:: Tinnitus  Vision Screening - Comments:: Wears rx glasses - up to date with routine eye exams with [facility] [place]    Goals Addressed             This Visit's Progress    Patient Stated       Work on better health        Depression Screen     09/15/2023   10:12 AM 05/02/2023   11:27 AM 04/07/2023   11:30 AM 03/31/2023   11:33 AM 05/17/2018    3:32 PM  PHQ 2/9 Scores  PHQ - 2 Score 2 0 2 4 0  PHQ- 9 Score 6  14 14      Fall Risk     09/15/2023   10:18 AM 05/02/2023   11:27 AM 04/07/2023   11:31 AM 03/31/2023   10:58 AM  05/17/2018    3:32 PM  Fall Risk   Falls in the past year? 1 0 0 1 0   Number falls in past yr: 1 0 0 1 0   Injury with Fall? 1 0 0 0 0  Comment scraped arms      Risk for fall due to : History of fall(s);Impaired mobility No Fall Risks No Fall Risks    Follow up Falls prevention discussed   Falls evaluation completed Falls evaluation completed      Data saved with a previous flowsheet row definition    MEDICARE RISK AT HOME:  Medicare Risk at Home Any stairs in or around the home?: Yes If so, are there any without handrails?: No Home free of loose throw rugs in walkways, pet beds, electrical cords, etc?: Yes Adequate lighting in your home to reduce risk of falls?: Yes Life alert?: No Use of a cane, walker or w/c?: No Grab bars in the bathroom?: No Shower chair or bench in shower?: No Elevated toilet seat or a handicapped toilet?: No  TIMED UP AND GO:  Was the test performed?  No  Cognitive Function: 6CIT completed        09/15/2023   10:18 AM  6CIT Screen  What Year? 0 points  What month? 0 points  What time? 0 points  Count back from 20 0 points  Months in reverse 0 points  Repeat phrase 0 points  Total Score 0 points    Immunizations Immunization History  Administered Date(s) Administered   DTaP 11/29/1996, 01/23/1997, 03/29/1997, 12/27/1997   HIB, Unspecified 11/29/1996, 01/23/1997, 12/27/1997   Hepatitis B, PED/ADOLESCENT 10/18/96, 10/29/1996, 03/29/1997   IPV 11/29/1996, 01/23/1997, 09/10/1997, 09/23/1997   Influenza Inj Mdck Quad Pf 02/21/2014   Influenza Nasal 11/15/2007   Influenza, Quadrivalent, Recombinant, Inj, Pf 03/08/2019   Influenza, Seasonal, Injecte, Preservative Fre 02/21/2014, 03/15/2016   Influenza,inj,Quad PF,6+ Mos 02/21/2014   Influenza-Unspecified 03/08/2019   MMR 09/23/1997, 03/15/2006, 03/15/2016   PFIZER(Purple Top)SARS-COV-2 Vaccination 10/31/2019   PPD Test 03/15/2016   Tdap 02/20/2014   Varicella 09/23/1997, 03/15/2016     Screening Tests Health Maintenance  Topic Date Due   Cervical Cancer Screening (Pap smear)  Never done   HPV VACCINES (1 - 3-dose series) 01/12/2024 (Originally 09/22/2011)   Hepatitis C Screening  02/22/2024 (Originally 09/22/2014)   DTaP/Tdap/Td (6 - Td or Tdap) 02/21/2024   Medicare Annual Wellness (AWV)  09/14/2024   Hepatitis B Vaccines 19-59  Average Risk  Completed   HIV Screening  Completed   Pneumococcal Vaccine  Aged Out   Meningococcal B Vaccine  Aged Out   INFLUENZA VACCINE  Discontinued   COVID-19 Vaccine  Discontinued    Health Maintenance  Health Maintenance Due  Topic Date Due   Cervical Cancer Screening (Pap smear)  Never done   Health Maintenance Items Addressed: See Nurse Notes at the end of this note  Additional Screening:  Vision Screening: Recommended annual ophthalmology exams for early detection of glaucoma and other disorders of the eye. Would you like a referral to an eye doctor? No    Dental Screening: Recommended annual dental exams for proper oral hygiene  Community Resource Referral / Chronic Care Management: CRR required this visit?  No   CCM required this visit?  No   Plan:    I have personally reviewed and noted the following in the patient's chart:   Medical and social history Use of alcohol, tobacco or illicit drugs  Current medications and supplements including opioid prescriptions. Patient is not currently taking opioid prescriptions. Functional ability and status Nutritional status Physical activity Advanced directives List of other physicians Hospitalizations, surgeries, and ER visits in previous 12 months Vitals Screenings to include cognitive, depression, and falls Referrals and appointments  In addition, I have reviewed and discussed with patient certain preventive protocols, quality metrics, and best practice recommendations. A written personalized care plan for preventive services as well as general preventive  health recommendations were provided to patient.   Ellouise VEAR Haws, LPN   [id number]   After Visit Summary: (MyChart) Due to this being a telephonic visit, the after visit summary with patients personalized plan was offered to patient via MyChart   Notes: Please refer to Routing Comments.

## 2023-09-15 NOTE — Patient Instructions (Signed)
 Jody Carter , Thank you for taking time out of your busy schedule to complete your Annual Wellness Visit with me. I enjoyed our conversation and look forward to speaking with you again next year. I, as well as your care team,  appreciate your ongoing commitment to your health goals. Please review the following plan we discussed and let me know if I can assist you in the future. Your Game plan/ To Do List    Referrals: If you haven't heard from the office you've been referred to, please reach out to them at the phone provided.   Follow up Visits: We will see or speak with you next year for your Next Medicare AWV with our clinical staff Have you seen your provider in the last 6 months (3 months if uncontrolled diabetes)? Yes  Clinician Recommendations:  Aim for 30 minutes of exercise or brisk walking, 6-8 glasses of water, and 5 servings of fruits and vegetables each day.       This is a list of the screenings recommended for you:  Health Maintenance  Topic Date Due   Medicare Annual Wellness Visit  Never done   Pap Smear  Never done   HPV Vaccine (1 - 3-dose series) 01/12/2024*   Hepatitis C Screening  02/22/2024*   DTaP/Tdap/Td vaccine (6 - Td or Tdap) 02/21/2024   Hepatitis B Vaccine  Completed   HIV Screening  Completed   Pneumococcal Vaccine  Aged Out   Meningitis B Vaccine  Aged Out   Flu Shot  Discontinued   COVID-19 Vaccine  Discontinued  *Topic was postponed. The date shown is not the original due date.    Advanced directives: (Declined) Advance directive discussed with you today. Even though you declined this today, please call our office should you change your mind, and we can give you the proper paperwork for you to fill out. Advance Care Planning is important because it:  [x]  Makes sure you receive the medical care that is consistent with your values, goals, and preferences  [x]  It provides guidance to your family and loved ones and reduces their decisional burden about  whether or not they are making the right decisions based on your wishes.  Follow the link provided in your after visit summary or read over the paperwork we have mailed to you to help you started getting your Advance Directives in place. If you need assistance in completing these, please reach out to us  so that we can help you!  See attachments for Preventive Care and Fall Prevention Tips.

## 2023-09-20 ENCOUNTER — Ambulatory Visit

## 2023-09-20 ENCOUNTER — Ambulatory Visit: Attending: Rheumatology | Admitting: Rheumatology

## 2023-09-20 ENCOUNTER — Encounter: Payer: Self-pay | Admitting: Rheumatology

## 2023-09-20 ENCOUNTER — Ambulatory Visit (INDEPENDENT_AMBULATORY_CARE_PROVIDER_SITE_OTHER)

## 2023-09-20 ENCOUNTER — Telehealth: Payer: Self-pay

## 2023-09-20 VITALS — BP 105/70 | HR 59 | Resp 15 | Ht 63.5 in | Wt 121.4 lb

## 2023-09-20 DIAGNOSIS — M25552 Pain in left hip: Secondary | ICD-10-CM | POA: Diagnosis not present

## 2023-09-20 DIAGNOSIS — E559 Vitamin D deficiency, unspecified: Secondary | ICD-10-CM | POA: Insufficient documentation

## 2023-09-20 DIAGNOSIS — L709 Acne, unspecified: Secondary | ICD-10-CM | POA: Insufficient documentation

## 2023-09-20 DIAGNOSIS — M797 Fibromyalgia: Secondary | ICD-10-CM | POA: Insufficient documentation

## 2023-09-20 DIAGNOSIS — E039 Hypothyroidism, unspecified: Secondary | ICD-10-CM | POA: Insufficient documentation

## 2023-09-20 DIAGNOSIS — G8929 Other chronic pain: Secondary | ICD-10-CM | POA: Insufficient documentation

## 2023-09-20 DIAGNOSIS — M25561 Pain in right knee: Secondary | ICD-10-CM | POA: Diagnosis present

## 2023-09-20 DIAGNOSIS — K3184 Gastroparesis: Secondary | ICD-10-CM | POA: Insufficient documentation

## 2023-09-20 DIAGNOSIS — G43809 Other migraine, not intractable, without status migrainosus: Secondary | ICD-10-CM

## 2023-09-20 DIAGNOSIS — G43E09 Chronic migraine with aura, not intractable, without status migrainosus: Secondary | ICD-10-CM | POA: Diagnosis present

## 2023-09-20 DIAGNOSIS — R768 Other specified abnormal immunological findings in serum: Secondary | ICD-10-CM | POA: Diagnosis not present

## 2023-09-20 DIAGNOSIS — K219 Gastro-esophageal reflux disease without esophagitis: Secondary | ICD-10-CM | POA: Diagnosis present

## 2023-09-20 DIAGNOSIS — F4310 Post-traumatic stress disorder, unspecified: Secondary | ICD-10-CM | POA: Diagnosis present

## 2023-09-20 DIAGNOSIS — M79641 Pain in right hand: Secondary | ICD-10-CM | POA: Insufficient documentation

## 2023-09-20 DIAGNOSIS — M79642 Pain in left hand: Secondary | ICD-10-CM | POA: Diagnosis not present

## 2023-09-20 DIAGNOSIS — L659 Nonscarring hair loss, unspecified: Secondary | ICD-10-CM | POA: Insufficient documentation

## 2023-09-20 DIAGNOSIS — M2550 Pain in unspecified joint: Secondary | ICD-10-CM | POA: Diagnosis present

## 2023-09-20 DIAGNOSIS — M47812 Spondylosis without myelopathy or radiculopathy, cervical region: Secondary | ICD-10-CM | POA: Insufficient documentation

## 2023-09-20 DIAGNOSIS — M25551 Pain in right hip: Secondary | ICD-10-CM

## 2023-09-20 DIAGNOSIS — K588 Other irritable bowel syndrome: Secondary | ICD-10-CM | POA: Diagnosis present

## 2023-09-20 DIAGNOSIS — R569 Unspecified convulsions: Secondary | ICD-10-CM | POA: Insufficient documentation

## 2023-09-20 DIAGNOSIS — M25562 Pain in left knee: Secondary | ICD-10-CM | POA: Insufficient documentation

## 2023-09-20 DIAGNOSIS — G4709 Other insomnia: Secondary | ICD-10-CM | POA: Insufficient documentation

## 2023-09-20 NOTE — Patient Instructions (Signed)
 Cervical Strain and Sprain Rehab Ask your health care provider which exercises are safe for you. Do exercises exactly as told by your health care provider and adjust them as directed. It is normal to feel mild stretching, pulling, tightness, or discomfort as you do these exercises. Stop right away if you feel sudden pain or your pain gets worse. Do not begin these exercises until told by your health care provider. Stretching and range-of-motion exercises Cervical side bending  Using good posture, sit on a stable chair or stand up. Without moving your shoulders, slowly tilt your left / right ear to your shoulder until you feel a stretch in the neck muscles on the opposite side. You should be looking straight ahead. Hold for __________ seconds. Repeat with the other side of your neck. Repeat __________ times. Complete this exercise __________ times a day. Cervical rotation  Using good posture, sit on a stable chair or stand up. Slowly turn your head to the side as if you are looking over your left / right shoulder. Keep your eyes level with the ground. Stop when you feel a stretch along the side and the back of your neck. Hold for __________ seconds. Repeat this by turning to your other side. Repeat __________ times. Complete this exercise __________ times a day. Thoracic extension and pectoral stretch  Roll a towel or a small blanket so it is about 4 inches (10 cm) in diameter. Lie down on your back on a firm surface. Put the towel in the middle of your back across your spine. It should not be under your shoulder blades. Put your hands behind your head and let your elbows fall out to your sides. Hold for __________ seconds. Repeat __________ times. Complete this exercise __________ times a day. Strengthening exercises Upper cervical flexion  Lie on your back with a thin pillow behind your head or a small, rolled-up towel under your neck. Gently tuck your chin toward your chest and nod  your head down to look toward your feet. Do not lift your head off the pillow. Hold for __________ seconds. Release the tension slowly. Relax your neck muscles completely before you repeat this exercise. Repeat __________ times. Complete this exercise __________ times a day. Cervical extension  Stand about 6 inches (15 cm) away from a wall, with your back facing the wall. Place a soft object, about 6-8 inches (15-20 cm) in diameter, between the back of your head and the wall. A soft object could be a small pillow, a ball, or a folded towel. Gently tilt your head back and press into the soft object. Keep your jaw and forehead relaxed. Hold for __________ seconds. Release the tension slowly. Relax your neck muscles completely before you repeat this exercise. Repeat __________ times. Complete this exercise __________ times a day. Posture and body mechanics Body mechanics refer to the movements and positions of your body while you do your daily activities. Posture is part of body mechanics. Good posture and healthy body mechanics can help to relieve stress in your body's tissues and joints. Good posture means that your spine is in its natural S-curve position (your spine is neutral), your shoulders are pulled back slightly, and your head is not tipped forward. The following are general guidelines for using improved posture and body mechanics in your everyday activities. Sitting  When sitting, keep your spine neutral and keep your feet flat on the floor. Use a footrest, if needed, and keep your thighs parallel to the floor. Avoid rounding  your shoulders. Avoid tilting your head forward. When working at a desk or a computer, keep your desk at a height where your hands are slightly lower than your elbows. Slide your chair under your desk so you are close enough to maintain good posture. When working at a computer, place your monitor at a height where you are looking straight ahead and you do not have to  tilt your head forward or downward to look at the screen. Standing  When standing, keep your spine neutral and keep your feet about hip-width apart. Keep a slight bend in your knees. Your ears, shoulders, and hips should line up. When you do a task in which you stand in one place for a long time, place one foot up on a stable object that is 2-4 inches (5-10 cm) high, such as a footstool. This helps keep your spine neutral. Resting When lying down and resting, avoid positions that are most painful for you. Try to support your neck in a neutral position. You can use a contour pillow or a small rolled-up towel. Your pillow should support your neck but not push on it. This information is not intended to replace advice given to you by your health care provider. Make sure you discuss any questions you have with your health care provider. Document Revised: 05/17/2022 Document Reviewed: 08/03/2021 Elsevier Patient Education  2024 [employer].

## 2023-09-20 NOTE — Telephone Encounter (Signed)
 Per Dr. Dolphus, would like patient to come back to repeat AP xray of left knee is the patient is willing. Attempted to contact the patient and left a message to call the office back.

## 2023-09-20 NOTE — Telephone Encounter (Signed)
 FYI, contacted the patient back and she will be coming in today to repeat left knee AP.

## 2023-09-20 NOTE — Telephone Encounter (Signed)
 If lab are normal, patient can be called with labs results and follow up can be canceled, per Dr. Dolphus.

## 2023-09-20 NOTE — Telephone Encounter (Signed)
 Patient called the office back and advised Per Dr. Dolphus, would like patient to come back to repeat AP xray of left knee is the patient is willing. Patient sis willing an would like to know when she is needing to come back in for Xray. Please advise

## 2023-09-21 ENCOUNTER — Ambulatory Visit: Admitting: Family Medicine

## 2023-09-21 ENCOUNTER — Telehealth: Payer: Self-pay | Admitting: Rheumatology

## 2023-09-21 NOTE — Telephone Encounter (Signed)
 X-rays of both hands, both hips and both knees were unremarkable.

## 2023-09-21 NOTE — Telephone Encounter (Signed)
 Pt would like to know if there are any updates on her x-rays. Pt would like a call to discuss those when they are read.

## 2023-09-21 NOTE — Telephone Encounter (Signed)
 Contacted the patient to advise that X-rays of both hands, both hips and both knees were unremarkable. Patient verbalized understanding. Patient has chosen to cancel her FU appointment due to not liking the provider and how things went.

## 2023-09-23 ENCOUNTER — Other Ambulatory Visit: Payer: Self-pay | Admitting: *Deleted

## 2023-09-23 DIAGNOSIS — R609 Edema, unspecified: Secondary | ICD-10-CM

## 2023-09-23 MED ORDER — FUROSEMIDE 20 MG PO TABS: 20.0000 mg | ORAL_TABLET | Freq: Every day | ORAL | 0 refills | Status: DC

## 2023-09-23 NOTE — Telephone Encounter (Signed)
 Patient has cancelled her new patient follow up appointment.

## 2023-09-24 LAB — CARDIOLIPIN ANTIBODIES, IGG, IGM, IGA
Anticardiolipin IgA: <2 [APL'U]/mL (ref <20.0)
Anticardiolipin IgG: <2 [GPL'U]/mL (ref <20.0)
Anticardiolipin IgM: <2 [MPL'U]/mL (ref <20.0)

## 2023-09-24 LAB — ANTI-NUCLEAR AB-TITER (ANA TITER): ANA Titer 1: 1:40 {titer} — ABNORMAL HIGH

## 2023-09-24 LAB — CK: Total CK: 48 U/L (ref 20–239)

## 2023-09-24 LAB — C3 AND C4
C3 Complement: 131 mg/dL (ref 83–193)
C4 Complement: 19 mg/dL (ref 15–57)

## 2023-09-24 LAB — SJOGRENS SYNDROME-A EXTRACTABLE NUCLEAR ANTIBODY: SSA (Ro) (ENA) Antibody, IgG: <1 AI

## 2023-09-24 LAB — VITAMIN D 25 HYDROXY (VIT D DEFICIENCY, FRACTURES): Vit D, 25-Hydroxy: 38 ng/mL (ref 30–100)

## 2023-09-24 LAB — RNP ANTIBODY: Ribonucleic Protein(ENA) Antibody, IgG: <1 AI

## 2023-09-24 LAB — PROTEIN / CREATININE RATIO, URINE
Creatinine, Urine: 110 mg/dL (ref 20–275)
Protein/Creat Ratio: 64 mg/g{creat} (ref 24–184)
Protein/Creatinine Ratio: 0.064 mg/mg{creat} (ref 0.024–0.184)
Total Protein, Urine: 7 mg/dL (ref 5–24)

## 2023-09-24 LAB — BETA-2 GLYCOPROTEIN ANTIBODIES
Beta-2 Glyco 1 IgA: <2 U/mL (ref <20.0)
Beta-2 Glyco 1 IgM: <2 U/mL (ref <20.0)
Beta-2 Glyco I IgG: <2 U/mL (ref <20.0)

## 2023-09-24 LAB — ANTI-DNA ANTIBODY, DOUBLE-STRANDED: ds DNA Ab: <1 [IU]/mL

## 2023-09-24 LAB — ANTI-SMITH ANTIBODY: ENA SM Ab Ser-aCnc: <1 AI

## 2023-09-24 LAB — ANA: Anti Nuclear Antibody (ANA): POSITIVE — AB

## 2023-09-27 ENCOUNTER — Encounter: Payer: Self-pay | Admitting: Family Medicine

## 2023-09-27 ENCOUNTER — Ambulatory Visit (INDEPENDENT_AMBULATORY_CARE_PROVIDER_SITE_OTHER): Admitting: Family Medicine

## 2023-09-27 VITALS — BP 107/74 | HR 84 | Temp 98.1°F | Ht 63.5 in | Wt 118.0 lb

## 2023-09-27 DIAGNOSIS — K588 Other irritable bowel syndrome: Secondary | ICD-10-CM

## 2023-09-27 DIAGNOSIS — K219 Gastro-esophageal reflux disease without esophagitis: Secondary | ICD-10-CM

## 2023-09-27 DIAGNOSIS — R768 Other specified abnormal immunological findings in serum: Secondary | ICD-10-CM | POA: Diagnosis not present

## 2023-09-27 DIAGNOSIS — F419 Anxiety disorder, unspecified: Secondary | ICD-10-CM | POA: Diagnosis not present

## 2023-09-27 MED ORDER — AMITRIPTYLINE HCL 25 MG PO TABS: 50.0000 mg | ORAL_TABLET | Freq: Every day | ORAL | Status: AC

## 2023-09-27 MED ORDER — BUSPIRONE HCL 5 MG PO TABS: 5.0000 mg | ORAL_TABLET | Freq: Two times a day (BID) | ORAL | 3 refills | Status: AC

## 2023-09-27 NOTE — Assessment & Plan Note (Signed)
 Symptoms are not adequately controlled.  Her amitriptyline  was recently increased to 50 mg daily by her rheumatologist which hopefully should help with her anxiety symptoms.  She has noticed some improvement the last few days with this.  We did discuss additional treatment options.  She has previously been on Xanax.  Discussed with patient we will try to avoid benzos for now due to side effects.  She is agreeable to this plan.  Will start BuSpar  5 mg twice daily.  We did talk about counseling as well.  She currently has a Veterinary surgeon and does not need another referral at this point.  She will follow-up with us  in a few weeks via MyChart and we can adjust medications as needed.

## 2023-09-27 NOTE — Assessment & Plan Note (Signed)
 Following with GI as above.  They are concerned about potential gastric motility disorders.

## 2023-09-27 NOTE — Assessment & Plan Note (Signed)
 Recently diagnosed with lupus at rheumatology.  She is now on Plaquenil and prednisone .  Recently had her amitriptyline  increased to 50 mg daily.  Will obtain records from her rheumatologist.

## 2023-09-27 NOTE — Patient Instructions (Signed)
 It was very nice to see you today!  VISIT SUMMARY: Today, we discussed your ongoing chest pain, recent hearing loss, and other symptoms related to lupus, gastrointestinal issues, and anxiety. We reviewed your current medications and made some adjustments to better manage your conditions.  YOUR PLAN: SYSTEMIC LUPUS ERYTHEMATOSUS (SLE): You have lupus, which is causing joint pain and inflammation, especially in your knees. -Continue taking hydroxychloroquine and prednisone  as prescribed. -We will request records from your rheumatologist to ensure continuity of care.  LEFT-SIDED HEARING LOSS WITH TINNITUS AND EPISODIC VERTIGO: You have recently experienced hearing loss in your left ear, along with tinnitus and vertigo, which may be related to lupus. -Continue taking prednisone  to reduce inflammation. -Your hearing test today is normal.  -Let us  know if you would like a referral to ENT   IRRITABLE BOWEL SYNDROME (IBS) WITH MOTILITY DISORDER: You have ongoing gastrointestinal symptoms, including nausea and vomiting, which may be related to a motility disorder. -Continue taking your IBS medication as prescribed. -Follow up with your GI specialist for further evaluation and management.  GASTROESOPHAGEAL REFLUX DISEASE (GERD): You have symptoms of GERD, including nausea and vomiting, which have worsened recently. -Continue taking Protonix  as prescribed. -Follow up with your GI specialist to monitor and manage your symptoms.  CHRONIC ANXIETY WITH SITUATIONAL STRESS: You have chronic anxiety that is worsened by situational stress. -Start taking buspirone  as prescribed to manage your anxiety. -We will monitor your response to the medication and adjust the treatment as needed.  INSOMNIA: You have difficulty sleeping, which was previously managed with Ambien . -Continue taking amitriptyline  at the increased dose to help with sleep. -Monitor your sleep patterns and we will adjust the treatment as  needed.  Return if symptoms worsen or fail to improve.   Take care, Dr Kennyth  PLEASE NOTE:  If you had any lab tests, please let us  know if you have not heard back within a few days. You may see your results on mychart before we have a chance to review them but we will give you a call once they are reviewed by us .   If we ordered any referrals today, please let us  know if you have not heard from their office within the next week.   If you had any urgent prescriptions sent in today, please check with the pharmacy within an hour of our visit to make sure the prescription was transmitted appropriately.   Please try these tips to maintain a healthy lifestyle:  Eat at least 3 REAL meals and 1-2 snacks per day.  Aim for no more than 5 hours between eating.  If you eat breakfast, please do so within one hour of getting up.   Each meal should contain half fruits/vegetables, one quarter protein, and one quarter carbs (no bigger than a computer mouse)  Cut down on sweet beverages. This includes juice, soda, and sweet tea.   Drink at least 1 glass of water with each meal and aim for at least 8 glasses per day  Exercise at least 150 minutes every week.

## 2023-09-27 NOTE — Assessment & Plan Note (Signed)
 Follows with gastroenterology.  Recently had her amitriptyline  dose increased which hopefully should help some with IBS though she will contact GI to schedule appointment soon.

## 2023-09-27 NOTE — Progress Notes (Signed)
 Jody Carter is a 28 y.o. female who presents today for an office visit.  Assessment/Plan:  New/Acute Problems: Tinnitus / Hearing Loss No obvious abnormality on exam today.  Hearing test is normal.  Symptoms are intermittent in nature.  May be related to her recent diagnosis of lupus.  Given overall reassuring exam today do not think she needs urgent evaluation.  We did discuss referral to ENT for further evaluation however she would like to hold off on this for now.  She will let us  know if symptoms do not improve with her prednisone  or if she develops any new symptoms and we can refer her at that time if needed.  Chronic Problems Addressed Today: Anxiety Symptoms are not adequately controlled.  Her amitriptyline  was recently increased to 50 mg daily by her rheumatologist which hopefully should help with her anxiety symptoms.  She has noticed some improvement the last few days with this.  We did discuss additional treatment options.  She has previously been on Xanax.  Discussed with patient we will try to avoid benzos for now due to side effects.  She is agreeable to this plan.  Will start BuSpar  5 mg twice daily.  We did talk about counseling as well.  She currently has a Veterinary surgeon and does not need another referral at this point.  She will follow-up with us  in a few weeks via MyChart and we can adjust medications as needed.  ANA positive Recently diagnosed with lupus at rheumatology.  She is now on Plaquenil and prednisone .  Recently had her amitriptyline  increased to 50 mg daily.  Will obtain records from her rheumatologist.  IBS (irritable bowel syndrome) Follows with gastroenterology.  Recently had her amitriptyline  dose increased which hopefully should help some with IBS though she will contact GI to schedule appointment soon.  GERD (gastroesophageal reflux disease) Following with GI as above.  They are concerned about potential gastric motility disorders.     Subjective:   HPI:  See assessment / plan for status of chronic conditions.  Patient is here today for follow-up visit.  I last saw her about 5 months ago.  At that time she was having ongoing issues with atypical chest pain.  She was recently in the ED at this visit as well and then had workup including CTA chest which showed cardiomegaly.  We had arranged for her to get an echocardiogram.  This was reassuring.  She was having some ongoing issues with GERD at her last visit and we switched her omeprazole  to Protonix  and referred her to GI.  She saw them on 07/18/2023 and were concerned about a motility disorder.  She has also been following with rheumatology and neurology since our last visit.  Discussed the use of AI scribe software for clinical note transcription with the patient, who gave verbal consent to proceed.  History of Present Illness Jody Carter is a 28 year old female with lupus who presents for follow-up of chest pain and recent hearing loss.  She has persistent chest pain, which has worsened recently. She was switched from omeprazole  to Protonix  and referred to a gastroenterologist, who raised concerns about a possible motility disorder.  She was recently diagnosed with lupus and started on hydroxychloroquine and prednisone , with an increased dose of amitriptyline  from 25 mg to 50 mg. She just began these medications, with prednisone  on a tapering schedule over 16 days. She experiences fluctuating temperatures, especially in her knees and face.  She reports a  recent onset of significant hearing loss in her left ear, starting about two weeks ago, with a sensation of being underwater and persistent tinnitus. This was accompanied by a sensation of a lump in her throat affecting her vocal cords, vision disturbances, and dizziness. She has had several falls due to sudden loss of balance, resulting in injuries such as hitting her head on a coffee table.  She experiences ongoing  gastrointestinal issues, including reflux and nausea, which have been intermittent. Recently, she had severe vomiting with blood, which she attributes to either new medication or dietary factors. She continues to take an acid blocker and has discontinued omeprazole . She also has hemorrhoids and has been prescribed IBS Relo, which has alleviated her symptoms.  She experiences significant anxiety impacting her relationships and causing social withdrawal. She has been seeing a Librarian, academic and previously used low-dose Xanax for acute anxiety episodes. She describes waking up with immediate anxiety, difficulty breathing, and panic attacks, particularly during a stressful weekend where she felt overwhelmed and cried frequently.  She notes sinus congestion with yellow discharge. She has a history of migraines and IBS, managed with amitriptyline .       Objective:  Physical Exam: BP 107/74   Pulse 84   Temp 98.1 F (36.7 C) (Temporal)   Ht 5' 3.5 (1.613 m)   Wt 118 lb (53.5 kg)   SpO2 100%   BMI 20.57 kg/m   Gen: No acute distress, resting comfortably HEENT: TMs clear bilaterally.  OEM Hearing test normal bilaterally. CV: Regular rate and rhythm with no murmurs appreciated Pulm: Normal work of breathing, clear to auscultation bilaterally with no crackles, wheezes, or rhonchi Neuro: Cranial nerves II through XII intact.  Finger-nose-finger testing intact bilaterally. Psych: Normal affect and thought content      Jody Spoon M. Kennyth, MD 09/27/2023 11:57 AM

## 2023-09-28 ENCOUNTER — Ambulatory Visit: Payer: Self-pay | Admitting: Rheumatology

## 2023-09-28 ENCOUNTER — Other Ambulatory Visit: Payer: Self-pay | Admitting: *Deleted

## 2023-09-28 MED ORDER — AMOXICILLIN-POT CLAVULANATE 875-125 MG PO TABS: 1.0000 | ORAL_TABLET | Freq: Two times a day (BID) | ORAL | 0 refills | Status: AC

## 2023-09-28 NOTE — Progress Notes (Signed)
 ANA is low titer positive and not significant, urine protein creatinine ratio normal, antibodies specific for lupus, Sjogren's, mixed connective tissue disease are negative, complements are normal enthesis which are usually low in the active autoimmune disease).  Clotting antibodies which include anticardiolipin antibodies and beta-2  GP 1 antibodies were negative.  Lupus anticoagulant is pending.  Muscle enzyme test normal.  Vitamin D  normal.  We can call patient once the lupus anticoagulant antibody results are available.  Patient mentioned that she would not be returning for a follow-up visit.  Please forward all the lab results to her PCP once the lab results are available.

## 2023-09-28 NOTE — Telephone Encounter (Signed)
 Okay to send in Augmentin  875-125 twice daily x 10 days.

## 2023-09-28 NOTE — Telephone Encounter (Signed)
**Note De-identified  [place] Obfuscation** Please advise 

## 2023-09-30 ENCOUNTER — Other Ambulatory Visit: Payer: Self-pay | Admitting: *Deleted

## 2023-10-05 ENCOUNTER — Telehealth: Payer: Self-pay

## 2023-10-05 ENCOUNTER — Other Ambulatory Visit (HOSPITAL_COMMUNITY): Payer: Self-pay

## 2023-10-05 NOTE — Telephone Encounter (Signed)
 Pharmacy Patient Advocate Encounter   Received notification from CoverMyMeds that prior authorization for Aimovig  140MG /ML auto-injectors is required/requested.   Insurance verification completed.   The patient is insured through [facility] .   Per test claim: PA required; PA submitted to above mentioned insurance via Latent Key/confirmation #/EOC [id number] Status is pending

## 2023-10-06 NOTE — Telephone Encounter (Signed)
 Pharmacy Patient Advocate Encounter  Received notification from [facility] Medicare that Prior Authorization for  Aimovig  140MG /ML auto-injectors has been APPROVED from 09-21-2023 to until further notice   PA #/Case ID/Reference #: [id number]

## 2023-10-22 ENCOUNTER — Other Ambulatory Visit: Payer: Self-pay | Admitting: Family Medicine

## 2023-10-22 ENCOUNTER — Encounter: Payer: Self-pay | Admitting: Family Medicine

## 2023-10-22 DIAGNOSIS — R609 Edema, unspecified: Secondary | ICD-10-CM

## 2023-10-24 NOTE — Telephone Encounter (Signed)
  Please advise  Last refill on 04/14/2017 done by Money, Caron NOVAK, FNP

## 2023-10-24 NOTE — Telephone Encounter (Signed)
 Ok to refill synthroid  88mcg daily.  Worth HERO. Kennyth, MD 10/24/2023 12:08 PM

## 2023-10-25 ENCOUNTER — Ambulatory Visit: Admitting: Rheumatology

## 2023-10-25 ENCOUNTER — Other Ambulatory Visit: Payer: Self-pay | Admitting: *Deleted

## 2023-10-25 MED ORDER — LEVOTHYROXINE SODIUM 88 MCG PO TABS: 88.0000 ug | ORAL_TABLET | Freq: Every day | ORAL | 0 refills | Status: DC

## 2023-10-25 NOTE — Telephone Encounter (Signed)
 See note  Rx Levothyroxine  send to [facility] pharmacy

## 2023-10-25 NOTE — Telephone Encounter (Signed)
 Please make sure she restarts the synthroid  and she should let us  know if not improving.  Worth HERO. Kennyth, MD 10/25/2023 11:30 AM

## 2023-10-26 ENCOUNTER — Other Ambulatory Visit: Payer: Self-pay | Admitting: Family Medicine

## 2023-10-27 ENCOUNTER — Other Ambulatory Visit: Payer: Self-pay | Admitting: *Deleted

## 2023-10-27 ENCOUNTER — Telehealth: Payer: Self-pay | Admitting: *Deleted

## 2023-10-27 MED ORDER — LEVOTHYROXINE SODIUM 88 MCG PO TABS: 88.0000 ug | ORAL_TABLET | Freq: Every day | ORAL | 1 refills | Status: DC

## 2023-10-27 NOTE — Telephone Encounter (Signed)
 Copied from CRM #[id number]. Topic: Clinical - Prescription Issue >> Oct 27, 2023  8:23 AM [name] wrote: Reason for CRM: Patient is following up on her refill request for the levothyroxine  (SYNTHROID ) 88 MCG tablet, shows sent to pharmacy on 9/30 and shows pending as of 10/1 and patient is out and has been out of medication for a week.  Jody Carter [phone]  Spoke with patient stated she just got the noticed Rx ready  Resend Rx with #90 and 1 refill  Per patient request  [facility]

## 2023-11-01 ENCOUNTER — Other Ambulatory Visit: Payer: Self-pay | Admitting: Gastroenterology

## 2023-11-02 ENCOUNTER — Ambulatory Visit: Admitting: Gastroenterology

## 2023-11-02 ENCOUNTER — Encounter: Payer: Self-pay | Admitting: Gastroenterology

## 2023-11-02 VITALS — BP 112/72 | HR 66 | Ht 63.5 in | Wt 123.0 lb

## 2023-11-02 DIAGNOSIS — K6289 Other specified diseases of anus and rectum: Secondary | ICD-10-CM

## 2023-11-02 DIAGNOSIS — R140 Abdominal distension (gaseous): Secondary | ICD-10-CM | POA: Diagnosis not present

## 2023-11-02 DIAGNOSIS — K602 Anal fissure, unspecified: Secondary | ICD-10-CM

## 2023-11-02 DIAGNOSIS — K581 Irritable bowel syndrome with constipation: Secondary | ICD-10-CM

## 2023-11-02 MED ORDER — AMBULATORY NON FORMULARY MEDICATION: 1 refills | Status: AC

## 2023-11-02 NOTE — Progress Notes (Signed)
 Chief Complaint:follow-up constipation Primary GI Doctor: Dr. San   HPI:  Patient is a 28 year old female patient with past medical history of anxiety, depression,migraines, seizures, who was referred to me by Kennyth Worth HERO, MD on 05/02/23 for a complaint of GERD .   Last seen in GI office on 07/18/23 for several GI complaints.  04/26/23 ED visit for lower sternal chest pain with some radiation towards the back.Patient's troponin levels less than 2. Lipase unremarkable. EKG shows patient is in sinus rhythm and chest x-ray shows no acute cardiopulmonary process. CT angio chest is negative for any signs of PE or other cardiopulmonary findings.   05/02/23 patient follow-up with PCP. Ordered echo,pending results Ascencion Coye refer to cardiology.Will be switching her omeprazole  to Protonix  and referring to GI .   Interval History    Patient presents for follow-up. Her main complaint today is of chronic constipation. She reports the Ibsrela  50 mg twice daily she started was helping with the constipation initially until the past two weeks. She added OTC Miralax  and stool softeners and had small BM. She is experiencing a lot of bloating. She reports this has exacerbated her hemorrhoids. She also notes a tear between her rectum and vagina. She reports some discomfort with bowel movements. She has tried the Anusol  suppositories which didn't provide any benefit.      She tells me she was recently diagnosed with Lupus and started on plaquenil but due to issues with heart palpitations, sob, and visual changes they stopped it.      Wt Readings from Last 3 Encounters:  11/02/23 123 lb (55.8 kg)  09/27/23 118 lb (53.5 kg)  09/20/23 121 lb 6.4 oz (55.1 kg)    Past Medical History:  Diagnosis Date   Anxiety    Depression    Encounter for supervision of normal pregnancy, antepartum 05/27/2021                 FAMILY TREE         RESULTS      Language    English    Pap           Initiated care at          GC/CT    Initial:            36wks:      Dating by    8wk US                 Support person         Genetics    NT/IT:     AFP:      Panorama:       BP cuff         Carrier Screen                     [place]/Hgb Elec           Rhogam                     TDaP vaccine         Blood Type              Epilepsy (HCC)    per patient   Fibromyalgia 2022   Hx of migraines    IBS (irritable bowel syndrome)    Panic disorder    Peripheral neuropathy    per patient   Seizures (HCC)    Susceptible to varicella (non-immune), currently pregnant 07/03/2013  Thyroid  disease     Past Surgical History:  Procedure Laterality Date   ADENOIDECTOMY     BREAST SURGERY     augmentation   CHOLECYSTECTOMY     DILATION AND EVACUATION N/A 02/07/2019   Procedure: DILATATION AND EVACUATION;  Surgeon: Alger Gong, MD;  Location: [facility];  Service: Gynecology;  Laterality: N/A;   EYE SURGERY Bilateral    closed tear ducts opened as child   TONSILLECTOMY      Current Outpatient Medications  Medication Sig Dispense Refill   albuterol (VENTOLIN HFA) 108 (90 Base) MCG/ACT inhaler Inhale 2 puffs into the lungs.     AMBULATORY NON FORMULARY MEDICATION Medication Name: Diltiazem 2% + Lidaocaine 5% Apply a pea sized amount internally two times daily. 30 g 1   amitriptyline  (ELAVIL ) 25 MG tablet Take 2 tablets (50 mg total) by mouth at bedtime.     busPIRone  (BUSPAR ) 5 MG tablet Take 1 tablet (5 mg total) by mouth 2 (two) times daily. 60 tablet 3   fluocinonide (LIDEX) 0.05 % external solution SMARTSIG:10-15 Drop(s) Topical Every Night     furosemide  (LASIX ) 20 MG tablet TAKE 1 TABLET(20 MG) BY MOUTH DAILY 90 tablet 0   hydroxychloroquine (PLAQUENIL) 200 MG tablet Take 200 mg by mouth 2 (two) times daily.     IBSRELA  50 MG TABS TAKE 1 TABLET BY MOUTH TWICE DAILY BEFORE A MEAL 60 tablet 2   ketoconazole (NIZORAL) 2 % shampoo SMARTSIG:Topical 4 Times a Week     levETIRAcetam  (KEPPRA ) 250 MG tablet Take 1  tablet (250 mg total) by mouth 2 (two) times daily. 180 tablet 0   levothyroxine  (SYNTHROID ) 88 MCG tablet Take 1 tablet (88 mcg total) by mouth daily before breakfast. For hypothyroidism 90 tablet 1   meloxicam  (MOBIC ) 15 MG tablet Take 1 tablet (15 mg total) by mouth daily. 30 tablet 0   ondansetron  (ZOFRAN -ODT) 4 MG disintegrating tablet Take 1 tablet (4 mg total) by mouth every 12 (twelve) hours as needed for nausea or vomiting. 30 tablet 1   pantoprazole  (PROTONIX ) 40 MG tablet Take 1 tablet (40 mg total) by mouth daily. 30 tablet 3   predniSONE  (DELTASONE ) 5 MG tablet Take by mouth.     promethazine  (PHENERGAN ) 25 MG tablet Take 1 tablet (25 mg total) by mouth every 6 (six) hours as needed for nausea or vomiting. 30 tablet 0   spironolactone (ALDACTONE) 50 MG tablet Take by mouth.     Vitamin D , Ergocalciferol , (DRISDOL) 1.25 MG (50000 UNIT) CAPS capsule Take 50,000 Units by mouth every 7 (seven) days.     zolpidem  (AMBIEN ) 5 MG tablet Take 1 tablet (5 mg total) by mouth at bedtime as needed for sleep. 15 tablet 1   No current facility-administered medications for this visit.    Allergies as of 11/02/2023 - Review Complete 11/02/2023  Allergen Reaction Noted   Sumatriptan  Other (See Comments) 08/08/2015    Family History  Problem Relation Age of Onset   Hypertension Mother    Colon polyps Mother    Heart attack Father        at least 3   Drug abuse Father    Healthy Sister    Stomach cancer Paternal Aunt    Dementia Maternal Grandmother    Hypertension Maternal Grandmother    Heart attack Maternal Grandmother    Hypertension Maternal Grandfather    Heart attack Maternal Grandfather    Diabetes Paternal Grandfather    Breast cancer Paternal  Great-grandmother    Colon cancer Other     Review of Systems:    Constitutional: No weight loss, fever, chills, weakness or fatigue HEENT: Eyes: No change in vision               Ears, Nose, Throat:  No change in hearing or  congestion Skin: No rash or itching Cardiovascular: No chest pain, chest pressure or palpitations   Respiratory: No SOB or cough Gastrointestinal: See HPI and otherwise negative Genitourinary: No dysuria or change in urinary frequency Neurological: No headache, dizziness or syncope Musculoskeletal: No new muscle or joint pain Hematologic: No bleeding or bruising Psychiatric: No history of depression or anxiety    Physical Exam:  Vital signs: BP 112/72   Pulse 66   Ht 5' 3.5 (1.613 m)   Wt 123 lb (55.8 kg)   BMI 21.45 kg/m   Constitutional: Pleasant female appears to be in NAD, Well developed, Well nourished, alert and cooperative Throat: Oral cavity and pharynx without inflammation, swelling or lesion.  Respiratory: Respirations even and unlabored. Lungs clear to auscultation bilaterally.   No wheezes, crackles, or rhonchi.  Cardiovascular: Normal S1, S2. Regular rate and rhythm. No peripheral edema, cyanosis or pallor.  Gastrointestinal:  Soft, nondistended, generalized abd tenderness. No rebound or guarding. Hypoactive bowel sounds. No appreciable masses or hepatomegaly. Rectal:  external rectal exam skin tags noted, normal rectal tone,  tender, no masses, posterior anal fissure noted, brown stool, hemoccult N/A  Msk:  Symmetrical without gross deformities. Without edema, no deformity or joint abnormality.  Neurologic:  Alert and  oriented x4;  grossly normal neurologically.  Skin:   Dry and intact without significant lesions or rashes. Psychiatric: Oriented to person, place and time. Demonstrates good judgement and reason without abnormal affect or behaviors.  RELEVANT LABS AND IMAGING: CBC    Latest Ref Rng & Units 04/26/2023   10:13 AM 03/31/2023   11:33 AM 02/28/2023   12:03 PM  CBC  WBC 4.0 - 10.5 K/uL 7.5  14.8  16.3   Hemoglobin 12.0 - 15.0 g/dL 88.2  87.2  87.9   Hematocrit 36.0 - 46.0 % 35.2  38.9  36.3   Platelets 150 - 400 K/uL 318  362.0  330.0      CMP      Latest Ref Rng & Units 04/26/2023   10:13 AM 03/31/2023   11:33 AM 02/28/2023   12:03 PM  CMP  Glucose 70 - 99 mg/dL 95  83  96   BUN 6 - 20 mg/dL 14  16  12    Creatinine 0.44 - 1.00 mg/dL 9.55  9.37  9.45   Sodium 135 - 145 mmol/L 139  141  135   Potassium 3.5 - 5.1 mmol/L 4.3  3.3  3.5   Chloride 98 - 111 mmol/L 110  105  106   CO2 22 - 32 mmol/L 23  28  23    Calcium  8.9 - 10.3 mg/dL 8.4  8.8  8.3   Total Protein 6.5 - 8.1 g/dL 5.9  6.2  6.0   Total Bilirubin 0.0 - 1.2 mg/dL 0.2  0.3  0.5   Alkaline Phos 38 - 126 U/L 51  55  54   AST 15 - 41 U/L 10  12  15    ALT 0 - 44 U/L 12  12  9       Lab Results  Component Value Date   TSH 3.04 03/31/2023  10/02/2020 EGD/colonoscopy at [facility] IMPRESSIONS and  PLAN:  Mild antral gastritis otherwise normal upper endoscopy  Normal colon mucosa and terminal ileum  Small internal hemorrhoids. Etiology of rectal bleeding  No pathology found on EGD or colonoscopy to explain the patient's abdominal pain.  Recall colonoscopy age 44 for screening purposes  Increase amitriptyline  to 50mg  QHS    04/26/23 CT Angio for chest pain IMPRESSION: 1. No evidence for pulmonary embolism. 2. No acute cardiopulmonary process. 3. Borderline cardiomegaly. 01/05/23 CT Abd/pelvis IMPRESSION: Right ovarian simple-appearing cyst measuring 3.7 cm. No follow-up imaging is recommended. 11/27/22 CT abd/pelvis IMPRESSION: No acute findings in the abdomen or pelvis. 04/08/22 CT abd/pelvis IMPRESSION: 1. Prominent fluid-filled loops of distal small bowel with gas fluid levels throughout the colon, as can be seen with enteritis and diarrheal illness. 2. Status post cholecystectomy there is new intra and extrahepatic biliary ductal dilation with the common duct measuring up to 12 mm. Possibly reflecting reservoir effect post cholecystectomy suggest correlation with laboratory values (T bili) to exclude biliary obstruction. If laboratory and clinical picture are  concerning for biliary obstruction further evaluation with pre and postcontrast enhanced MRCP is recommended. 3. Ill-defined hypodensity in the medial left lobe and caudate lobe of the liver, which is nonspecific but favored to reflect focal fatty infiltration. Consider attention on aforementioned MRCP if obtained otherwise further evaluation with nonemergent hepatic protocol MRI with and without contrast. 4. Wall thickening versus underdistention of the gastric antrum, correlate for gastritis. 5. Trace pelvic free fluid, likely physiologic. Assessment: Encounter Diagnoses  Name Primary?   Irritable bowel syndrome with constipation Yes   Bloating    Anal fissure    Rectal pain       28 year old female patient presents for follow-up of chronic constipation.  Patient was doing well with IBS Rella 50 mg twice daily until the last 2 weeks.  Patient states she has been having issues with going to the restroom which has led to severe bloating and rectal discomfort.  Upon physical examination noted posterior anal fissure we will go ahead and prescribe topical diltiazem with lidocaine .  Instructed patient to do a bowel purge on day 1 followed by restarting Ibsrela .  We did discuss potentially considering promotility agent Motegrity.  Will order abdominal KUB today with distention to r/o blockage. Plan: - Bowel purge day #1 -Continue Ibsrela  50 mg twice daily  -OTC miralax  po daily prn -RX for dialtaezem topical BID -sitz baths -follow-up 2-3 mths with Dr. San   Thank you for the courtesy of this consult. Please call me with any questions or concerns.   Jody Winborne, FNP-C [facility] 11/02/2023, 4:05 PM  Cc: Kennyth Worth HERO, MD

## 2023-11-02 NOTE — Patient Instructions (Addendum)
 Deanna, NP recommends that you complete a bowel purge (to clean out your bowels). Please do the following: Purchase a bottle of Miralax  over the counter as well as a box of 5 mg dulcolax tablets. Take 4 dulcolax tablets. Wait 1 hour. You will then drink 6-8 capfuls of Miralax  mixed in an adequate amount of water/juice/gatorade (you may choose which of these liquids to drink) over the next 2-3 hours. You should expect results within 1 to 6 hours after completing the bowel purge.  After bowel purge Restart Ibsrela  50 mg twice daily  Can use Miralax  prn as needed in addition  Do not do bowel purge until after abdominal xray  We have sent the following medications to your pharmacy for you to pick up at your convenience: Diltiazem topical  Your provider has requested that you have an abdominal x ray before leaving today. Please go to the basement floor to our Radiology department for the test.  _______________________________________________________  If your blood pressure at your visit was 140/90 or greater, please contact your primary care physician to follow up on this.  _______________________________________________________  If you are age 41 or older, your body mass index should be between 23-30. Your Body mass index is 21.45 kg/m. If this is out of the aforementioned range listed, please consider follow up with your Primary Care Provider.  If you are age 49 or younger, your body mass index should be between 19-25. Your Body mass index is 21.45 kg/m. If this is out of the aformentioned range listed, please consider follow up with your Primary Care Provider.   ________________________________________________________  The [facility] providers would like to encourage you to use MYCHART to communicate with providers for non-urgent requests or questions.  Due to long hold times on the telephone, sending your provider a message by [facility] may be a faster and more efficient way to get a  response.  Please allow 48 business hours for a response.  Please remember that this is for non-urgent requests.  _______________________________________________________  [facility] is using a team-based approach to care.  Your team is made up of your doctor and two to three APPS. Our APPS (Nurse Practitioners and Physician Assistants) work with your physician to ensure care continuity for you. They are fully qualified to address your health concerns and develop a treatment plan. They communicate directly with your gastroenterologist to care for you. Seeing the Advanced Practice Practitioners on your physician's team can help you by facilitating care more promptly, often allowing for earlier appointments, access to diagnostic testing, procedures, and other specialty referrals.   Thank you for trusting me with your gastrointestinal care. Deanna May, FNP-C

## 2023-11-03 ENCOUNTER — Ambulatory Visit: Payer: Self-pay

## 2023-11-03 NOTE — Telephone Encounter (Signed)
 Patient states she may go to the ER later but she will have to get someone to watch her child and she would prefer to see her PCP because he is familiar with her medical history  FYI Only or Action Required?: Action required by provider: clinical question for provider and update on patient condition.  Patient was last seen in primary care on 09/27/2023 by Kennyth Worth HERO, MD.  Called Nurse Triage reporting Facial Injury.  Symptoms began 2 weeks ago for vision and 2 days ago for nose injury.  Interventions attempted: Rest, hydration, or home remedies.  Symptoms are: unchanged.  Triage Disposition: Go to ED Now (Notify PCP)  Patient/caregiver understands and will follow disposition?: Yes, but will wait        Copied from CRM [id number]. Topic: Clinical - Red Word Triage >> Nov 03, 2023 12:17 PM [place] E wrote: [facility] that prompted transfer to Nurse Triage: Patient fell 2 days, and broke nose. Reason for Disposition  Sounds like a serious injury to the triager  Answer Assessment - Initial Assessment Questions [place] 2 days ago & states that she hit her face on a truck handle Patient was out of town Did not get seen for this Patient states she had a deviated septum prior to this but she states this fall made the septum straighter---pt states breathing better now through her nose than before the fall Slight bruising under left eye 2-3 out of 10  Just diagnosed with Lupus --Lupus clinic put her on a medication that has been affecting her vision --hydroxychloroquine started & vision started getting blurry and her eyes hurt---pt has spoken with vision issues after starting this medication  Patient hadnt spoken to he prescribing doctor because she wants to speak with her PCP Vision issues started two weeks ago--comes and goes --pt did book an appt with her eye doctor She states this is off and on and with seeing far away  Advised patient that being seen and evaluated today for her  symptoms would be advised  With patient having multiple complaints and a traumatic injury to her face involving her nose, & having possible side effects to a medication she is taking---and there are no available appointments at her PCP office--it is recommended that she be seen and evaluated today-- Patient states that she will go to Urgent Care or the Emergency Room. Patient is advised that the Emergency Room has more capabilities. Patient states that she cannot go to the Emergency Room until she is able to get someone to watch her child later today.    1. MECHANISM: How did the injury happen?      Slipped and face hit door handle 2. ONSET: When did the injury happen? (e.g., minutes, hours ago)      2 days ago 3. LOCATION: What part of the nose is injured?      Entire nose 4. APPEARANCE of INJURY: What does the nose look like?      Swelling of nose, slight bruising under left eye 5. BLEEDING: Is the nose still bleeding? If Yes, ask: Is it difficult to stop?      no 6. SIZE: For cuts, bruises, or swelling, ask: How large is it? (e.g., inches or centimeters;  entire nose)      Swelling has gotten a little better 7. PAIN: Is it painful? If Yes, ask: How bad is the pain? (Scale 0-10; or none, mild, moderate, severe)     2-3 8. TETANUS: For any breaks in  the skin, ask: When was your last tetanus booster?     unsure 9. OTHER SYMPTOMS: Do you have any other symptoms? (e.g., headache, neck pain, loss of consciousness)     --- 10. PREGNANCY: Is there any chance you are pregnant? When was your last menstrual period?       No  Protocols used: Nose Injury-A-AH

## 2023-11-03 NOTE — Progress Notes (Signed)
 Agree with the assessment and plan as outlined by [facility], FNP-C.  Carlitos Bottino, DO, [facility]

## 2023-11-03 NOTE — Telephone Encounter (Signed)
 Called [facility] and advised them of patient's situation and inability to go to the ER at this time and her preference to see her PCP if possible.

## 2023-11-03 NOTE — Progress Notes (Signed)
 Agree with the assessment and plan as outlined by Quentin Mulling, PA-C. ? ?Keron Neenan, DO, FACG ? ?

## 2023-11-03 NOTE — Telephone Encounter (Signed)
 Spoke with patient advise PCP not in office till Monday if Sx worsen please go to ED  Patient verbalized understanding

## 2023-11-06 ENCOUNTER — Other Ambulatory Visit: Payer: Self-pay | Admitting: Family Medicine

## 2023-11-07 ENCOUNTER — Ambulatory Visit (INDEPENDENT_AMBULATORY_CARE_PROVIDER_SITE_OTHER): Admitting: Family Medicine

## 2023-11-07 ENCOUNTER — Encounter: Payer: Self-pay | Admitting: Family Medicine

## 2023-11-07 VITALS — BP 118/70 | HR 72 | Temp 97.9°F | Ht 63.5 in | Wt 119.4 lb

## 2023-11-07 DIAGNOSIS — Z124 Encounter for screening for malignant neoplasm of cervix: Secondary | ICD-10-CM | POA: Diagnosis not present

## 2023-11-07 DIAGNOSIS — M329 Systemic lupus erythematosus, unspecified: Secondary | ICD-10-CM | POA: Diagnosis not present

## 2023-11-07 DIAGNOSIS — J342 Deviated nasal septum: Secondary | ICD-10-CM | POA: Diagnosis not present

## 2023-11-07 NOTE — Assessment & Plan Note (Signed)
 She is following with rheumatology.  She is now on Plaquenil and prednisone .  She has had some side effects with the Plaquenil and will be following up with her rheumatologist to discuss this.  She also has upcoming eye appointment as well.

## 2023-11-07 NOTE — Progress Notes (Signed)
   Jody Carter is a 28 y.o. female who presents today for an office visit.  Assessment/Plan:  Deviated Septum  Will refer to ENT for further evaluation.  This has been a longstanding issue but worsened recently after suffering trauma to her nose last week. She possibly had nasal fracture however no ongoing bleeding and pain is manageable - do not think we need to obtain any imaging at this point and will defer further management to ENT.  SLE (systemic lupus erythematosus) (HCC) She is following with rheumatology.  She is now on Plaquenil and prednisone .  She has had some side effects with the Plaquenil and will be following up with her rheumatologist to discuss this.  She also has upcoming eye appointment as well.     Subjective:  HPI:  See assessment / plan for status of chronic conditions.    Discussed the use of AI scribe software for clinical note transcription with the patient, who gave verbal consent to proceed.  History of Present Illness Jody Carter is a 28 year old female with lupus who presents with concerns about medication side effects and nasal injury.  She was prescribed hydroxychloroquine for her lupus, which has led to significant side effects including severe vision problems, hand cramping, heart palpitations, dizziness, and constipation. Her vision issues involve pain, blurriness, and seeing colors and lights. She has not seen an eye doctor since December.  Last week, she sustained a nasal injury while traveling, hitting her nose on a door handle, which caused her nose to bleed. She has a history of a deviated septum from a previous injury at age 53 or 59, which affected her breathing. Currently, she experiences a popping sensation and obstruction in her nasal passages, with pain localized to the area of impact.  She has been experiencing memory issues and severe stomach problems, including nausea and vomiting, which led to the nasal injury. She has not  sought medical attention for these issues since they occurred while she was out of town.         Objective:  Physical Exam: BP 118/70   Pulse 72   Temp 97.9 F (36.6 C) (Temporal)   Ht 5' 3.5 (1.613 m)   Wt 119 lb 6.4 oz (54.2 kg)   SpO2 99%   BMI 20.82 kg/m   Gen: No acute distress, resting comfortably HEENT: Deviated septum noted.  Bilateral nares with narrow caliber.  CV: Regular rate and rhythm with no murmurs appreciated Pulm: Normal work of breathing, clear to auscultation bilaterally with no crackles, wheezes, or rhonchi Neuro: Grossly normal, moves all extremities Psych: Normal affect and thought content      Gailen Venne M. Kennyth, MD 11/07/2023 9:56 AM

## 2023-11-07 NOTE — Patient Instructions (Addendum)
 It was very nice to see you today!  VISIT SUMMARY: Today, we discussed your concerns about medication side effects from hydroxychloroquine, a recent nasal injury, and issues with swallowing. We have outlined a plan to address each of these concerns.  YOUR PLAN: SYSTEMIC LUPUS ERYTHEMATOSUS WITH HYDROXYCHLOROQUINE ADVERSE EFFECTS: You are experiencing several side effects from hydroxychloroquine, including vision problems, hand cramping, heart palpitations, dizziness, and constipation. -Discuss the side effects with your rheumatologist to evaluate alternative treatment options. -Schedule an earlier eye appointment if possible to address vision issues.  NASAL FRACTURE WITH DEVIATED SEPTUM: You have difficulty breathing, popping sensations, and increased obstruction on one side of your nose due to a recent injury and a deviated septum. -You will be referred to an ENT specialist for evaluation and management of your deviated septum.   Return if symptoms worsen or fail to improve.   Take care, Dr Kennyth  PLEASE NOTE:  If you had any lab tests, please let us  know if you have not heard back within a few days. You may see your results on mychart before we have a chance to review them but we will give you a call once they are reviewed by us .   If we ordered any referrals today, please let us  know if you have not heard from their office within the next week.   If you had any urgent prescriptions sent in today, please check with the pharmacy within an hour of our visit to make sure the prescription was transmitted appropriately.   Please try these tips to maintain a healthy lifestyle:  Eat at least 3 REAL meals and 1-2 snacks per day.  Aim for no more than 5 hours between eating.  If you eat breakfast, please do so within one hour of getting up.   Each meal should contain half fruits/vegetables, one quarter protein, and one quarter carbs (no bigger than a computer mouse)  Cut down on sweet  beverages. This includes juice, soda, and sweet tea.   Drink at least 1 glass of water with each meal and aim for at least 8 glasses per day  Exercise at least 150 minutes every week.

## 2023-11-23 ENCOUNTER — Other Ambulatory Visit: Payer: Self-pay | Admitting: Family Medicine

## 2023-11-24 ENCOUNTER — Telehealth

## 2023-11-25 ENCOUNTER — Telehealth: Admitting: Physician Assistant

## 2023-11-25 DIAGNOSIS — R109 Unspecified abdominal pain: Secondary | ICD-10-CM

## 2023-11-25 NOTE — Patient Instructions (Signed)
 Jody Carter, thank you for joining Teena Shuck, PA-C for today's virtual visit.  While this provider is not your primary care provider (PCP), if your PCP is located in our provider database this encounter information will be shared with them immediately following your visit.   A [facility] MyChart account gives you access to today's visit and all your visits, tests, and labs performed at [facility]  click here if you don't have a [facility] MyChart account or go to [email or web address]  Consent: (Patient) Jody Carter provided verbal consent for this virtual visit at the beginning of the encounter.  Current Medications:  Current Outpatient Medications:    albuterol (VENTOLIN HFA) 108 (90 Base) MCG/ACT inhaler, Inhale 2 puffs into the lungs., Disp: , Rfl:    AMBULATORY NON FORMULARY MEDICATION, Medication Name: Diltiazem 2% + Lidaocaine 5% Apply a pea sized amount internally two times daily., Disp: 30 g, Rfl: 1   amitriptyline  (ELAVIL ) 25 MG tablet, Take 2 tablets (50 mg total) by mouth at bedtime., Disp: , Rfl:    busPIRone  (BUSPAR ) 5 MG tablet, Take 1 tablet (5 mg total) by mouth 2 (two) times daily., Disp: 60 tablet, Rfl: 3   fluocinonide (LIDEX) 0.05 % external solution, SMARTSIG:10-15 Drop(s) Topical Every Night, Disp: , Rfl:    furosemide  (LASIX ) 20 MG tablet, TAKE 1 TABLET(20 MG) BY MOUTH DAILY, Disp: 90 tablet, Rfl: 0   hydroxychloroquine (PLAQUENIL) 200 MG tablet, Take 200 mg by mouth 2 (two) times daily., Disp: , Rfl:    IBSRELA  50 MG TABS, TAKE 1 TABLET BY MOUTH TWICE DAILY BEFORE A MEAL, Disp: 60 tablet, Rfl: 2   ketoconazole (NIZORAL) 2 % shampoo, SMARTSIG:Topical 4 Times a Week, Disp: , Rfl:    levETIRAcetam  (KEPPRA ) 250 MG tablet, TAKE 1 TABLET(250 MG) BY MOUTH TWICE DAILY, Disp: 180 tablet, Rfl: 0   levothyroxine  (SYNTHROID ) 88 MCG tablet, Take 1 tablet (88 mcg total) by mouth daily before breakfast., Disp: 90 tablet, Rfl: 3   meloxicam  (MOBIC ) 15  MG tablet, Take 1 tablet (15 mg total) by mouth daily., Disp: 30 tablet, Rfl: 0   ondansetron  (ZOFRAN -ODT) 4 MG disintegrating tablet, Take 1 tablet (4 mg total) by mouth every 12 (twelve) hours as needed for nausea or vomiting., Disp: 30 tablet, Rfl: 1   pantoprazole  (PROTONIX ) 40 MG tablet, Take 1 tablet (40 mg total) by mouth daily., Disp: 30 tablet, Rfl: 3   predniSONE  (DELTASONE ) 5 MG tablet, Take by mouth., Disp: , Rfl:    promethazine  (PHENERGAN ) 25 MG tablet, Take 1 tablet (25 mg total) by mouth every 6 (six) hours as needed for nausea or vomiting., Disp: 30 tablet, Rfl: 0   spironolactone (ALDACTONE) 50 MG tablet, Take by mouth., Disp: , Rfl:    Vitamin D , Ergocalciferol , (DRISDOL) 1.25 MG (50000 UNIT) CAPS capsule, Take 50,000 Units by mouth every 7 (seven) days., Disp: , Rfl:    zolpidem  (AMBIEN ) 5 MG tablet, Take 1 tablet (5 mg total) by mouth at bedtime as needed for sleep., Disp: 15 tablet, Rfl: 1   Medications ordered in this encounter:  No orders of the defined types were placed in this encounter.    *If you need refills on other medications prior to your next appointment, please contact your pharmacy*  Follow-Up: Call back or seek an in-person evaluation if the symptoms worsen or if the condition fails to improve as anticipated.  [facility] [phone]  Other Instructions Report to ER.  If you have been instructed to have an in-person evaluation today at a local Urgent Care facility, please use the link below. It will take you to a list of all of our available [facility], including address, phone number and hours of operation. Please do not delay care.  [facility]  If you or a family member do not have a primary care provider, use the link below to schedule a visit and establish care. When you choose a [facility] primary care physician or advanced practice provider, you gain a long-term partner in health. Find a Primary Care  Provider  Learn more about [facility] in-office and virtual care options:  - Get Care Now

## 2023-11-25 NOTE — Progress Notes (Signed)
 Virtual Visit Consent   Jody Carter, you are scheduled for a virtual visit with a [facility] provider today. Just as with appointments in the office, your consent must be obtained to participate. Your consent will be active for this visit and any virtual visit you may have with one of our providers in the next 365 days. If you have a MyChart account, a copy of this consent can be sent to you electronically.  As this is a virtual visit, video technology does not allow for your provider to perform a traditional examination. This may limit your provider's ability to fully assess your condition. If your provider identifies any concerns that need to be evaluated in person or the need to arrange testing (such as labs, EKG, etc.), we will make arrangements to do so. Although advances in technology are sophisticated, we cannot ensure that it will always work on either your end or our end. If the connection with a video visit is poor, the visit may have to be switched to a telephone visit. With either a video or telephone visit, we are not always able to ensure that we have a secure connection.  By engaging in this virtual visit, you consent to the provision of healthcare and authorize for your insurance to be billed (if applicable) for the services provided during this visit. Depending on your insurance coverage, you may receive a charge related to this service.  I need to obtain your verbal consent now. Are you willing to proceed with your visit today? [name] has provided verbal consent on 11/25/2023 for a virtual visit (video or telephone). Jody Carter, [place]  Date: 11/25/2023 6:33 PM   Virtual Visit via Video Note   I, Jody Carter, connected with  Jody Carter  ([id number], 08-08-1995) on 11/25/23 at  6:30 PM EDT by a video-enabled telemedicine application and verified that I am speaking with the correct person using two identifiers.  Location: Patient: Virtual Visit Location  Patient: Home Provider: Virtual Visit Location Provider: Home Office   I discussed the limitations of evaluation and management by telemedicine and the availability of in person appointments. The patient expressed understanding and agreed to proceed.    History of Present Illness: Jody DOMBECK is a 28 y.o. who identifies as a female who was assigned female at birth, and is being seen today for abdominal pain.  HPI: Abdominal Pain This is a new problem. The problem has been rapidly worsening. The pain is located in the generalized abdominal region and periumbilical region. The pain is moderate. Associated symptoms include constipation. Pertinent negatives include no flatus. The pain is aggravated by being still. The pain is relieved by Nothing. She has tried nothing for the symptoms. The treatment provided no relief.    Problems:  Patient Active Problem List   Diagnosis Date Noted   PTSD (post-traumatic stress disorder) 04/12/2023   Insomnia 04/07/2023   Anxiety 03/31/2023   Cervical spondylosis 02/22/2023   SLE (systemic lupus erythematosus) (HCC) 02/22/2023   IBS (irritable bowel syndrome) 02/22/2023   Hypothyroidism 02/22/2023   Alopecia 02/22/2023   Acne 02/22/2023   GERD (gastroesophageal reflux disease) 02/22/2023   Seizures (HCC) 10/28/2019   Migraine 07/31/2012    Allergies:  Allergies  Allergen Reactions   Sumatriptan  Other (See Comments)    Neck and chest pressure, parasthesias   Medications:  Current Outpatient Medications:    albuterol (VENTOLIN HFA) 108 (90 Base) MCG/ACT inhaler, Inhale 2 puffs into the lungs., Disp: ,  Rfl:    AMBULATORY NON FORMULARY MEDICATION, Medication Name: Diltiazem 2% + Lidaocaine 5% Apply a pea sized amount internally two times daily., Disp: 30 g, Rfl: 1   amitriptyline  (ELAVIL ) 25 MG tablet, Take 2 tablets (50 mg total) by mouth at bedtime., Disp: , Rfl:    busPIRone  (BUSPAR ) 5 MG tablet, Take 1 tablet (5 mg total) by mouth 2 (two)  times daily., Disp: 60 tablet, Rfl: 3   fluocinonide (LIDEX) 0.05 % external solution, SMARTSIG:10-15 Drop(s) Topical Every Night, Disp: , Rfl:    furosemide  (LASIX ) 20 MG tablet, TAKE 1 TABLET(20 MG) BY MOUTH DAILY, Disp: 90 tablet, Rfl: 0   hydroxychloroquine (PLAQUENIL) 200 MG tablet, Take 200 mg by mouth 2 (two) times daily., Disp: , Rfl:    IBSRELA  50 MG TABS, TAKE 1 TABLET BY MOUTH TWICE DAILY BEFORE A MEAL, Disp: 60 tablet, Rfl: 2   ketoconazole (NIZORAL) 2 % shampoo, SMARTSIG:Topical 4 Times a Week, Disp: , Rfl:    levETIRAcetam  (KEPPRA ) 250 MG tablet, TAKE 1 TABLET(250 MG) BY MOUTH TWICE DAILY, Disp: 180 tablet, Rfl: 0   levothyroxine  (SYNTHROID ) 88 MCG tablet, Take 1 tablet (88 mcg total) by mouth daily before breakfast., Disp: 90 tablet, Rfl: 3   meloxicam  (MOBIC ) 15 MG tablet, Take 1 tablet (15 mg total) by mouth daily., Disp: 30 tablet, Rfl: 0   ondansetron  (ZOFRAN -ODT) 4 MG disintegrating tablet, Take 1 tablet (4 mg total) by mouth every 12 (twelve) hours as needed for nausea or vomiting., Disp: 30 tablet, Rfl: 1   pantoprazole  (PROTONIX ) 40 MG tablet, Take 1 tablet (40 mg total) by mouth daily., Disp: 30 tablet, Rfl: 3   predniSONE  (DELTASONE ) 5 MG tablet, Take by mouth., Disp: , Rfl:    promethazine  (PHENERGAN ) 25 MG tablet, Take 1 tablet (25 mg total) by mouth every 6 (six) hours as needed for nausea or vomiting., Disp: 30 tablet, Rfl: 0   spironolactone (ALDACTONE) 50 MG tablet, Take by mouth., Disp: , Rfl:    Vitamin D , Ergocalciferol , (DRISDOL) 1.25 MG (50000 UNIT) CAPS capsule, Take 50,000 Units by mouth every 7 (seven) days., Disp: , Rfl:    zolpidem  (AMBIEN ) 5 MG tablet, Take 1 tablet (5 mg total) by mouth at bedtime as needed for sleep., Disp: 15 tablet, Rfl: 1  Observations/Objective: Patient is well-developed, well-nourished in no acute distress.  Resting comfortably  at home.  Head is normocephalic, atraumatic.  No labored breathing.  Speech is clear and coherent with  logical content.  Patient is alert and oriented at baseline.    Assessment and Plan: 1. Abdominal pain, unspecified abdominal location (Primary)  Patient with ongoing abdominal pain felt periumbilical and RLQ. Advised to report to ER for evaluation.   Follow Up Instructions: I discussed the assessment and treatment plan with the patient. The patient was provided an opportunity to ask questions and all were answered. The patient agreed with the plan and demonstrated an understanding of the instructions.  A copy of instructions were sent to the patient via MyChart unless otherwise noted below.    The patient was advised to call back or seek an in-person evaluation if the symptoms worsen or if the condition fails to improve as anticipated.    Jody Shuck, PA-C

## 2023-12-01 ENCOUNTER — Ambulatory Visit (INDEPENDENT_AMBULATORY_CARE_PROVIDER_SITE_OTHER): Admitting: Family Medicine

## 2023-12-01 VITALS — BP 100/60 | HR 97 | Temp 98.6°F | Resp 20 | Ht 63.0 in | Wt 120.0 lb

## 2023-12-01 DIAGNOSIS — R1013 Epigastric pain: Secondary | ICD-10-CM | POA: Diagnosis not present

## 2023-12-01 DIAGNOSIS — K589 Irritable bowel syndrome without diarrhea: Secondary | ICD-10-CM | POA: Diagnosis not present

## 2023-12-01 DIAGNOSIS — K5900 Constipation, unspecified: Secondary | ICD-10-CM

## 2023-12-01 DIAGNOSIS — N83209 Unspecified ovarian cyst, unspecified side: Secondary | ICD-10-CM | POA: Insufficient documentation

## 2023-12-01 LAB — CBC
HCT: 39.4 % (ref 36.0–46.0)
Hemoglobin: 13.4 g/dL (ref 12.0–15.0)
MCHC: 34 g/dL (ref 30.0–36.0)
MCV: 93.6 fl (ref 78.0–100.0)
Platelets: 340 K/uL (ref 150.0–400.0)
RBC: 4.21 Mil/uL (ref 3.87–5.11)
RDW: 12.9 % (ref 11.5–15.5)
WBC: 8.8 K/uL (ref 4.0–10.5)

## 2023-12-01 LAB — COMPREHENSIVE METABOLIC PANEL WITH GFR
ALT: 9 U/L (ref 0–35)
AST: 13 U/L (ref 0–37)
Albumin: 4.2 g/dL (ref 3.5–5.2)
Alkaline Phosphatase: 72 U/L (ref 39–117)
BUN: 16 mg/dL (ref 6–23)
CO2: 26 meq/L (ref 19–32)
Calcium: 8.6 mg/dL (ref 8.4–10.5)
Chloride: 108 meq/L (ref 96–112)
Creatinine, Ser: 0.59 mg/dL (ref 0.40–1.20)
GFR: 123.71 mL/min (ref >60.00)
Glucose, Bld: 93 mg/dL (ref 70–99)
Potassium: 4.5 meq/L (ref 3.5–5.1)
Sodium: 139 meq/L (ref 135–145)
Total Bilirubin: 0.4 mg/dL (ref 0.2–1.2)
Total Protein: 6.2 g/dL (ref 6.0–8.3)

## 2023-12-01 LAB — LIPASE: Lipase: 12 U/L (ref 11.0–59.0)

## 2023-12-01 LAB — TSH: TSH: 0.5 u[IU]/mL (ref 0.35–5.50)

## 2023-12-01 NOTE — Assessment & Plan Note (Signed)
 Patient follows with gastroenterology for this and these to them about a month ago however unfortunately since her last visit with them she has had continued and worsening epigastric pain associated with constipation nausea and inability to tolerate p.o.  She has had ongoing issues with hematochezia which she attributes to hemorrhoids though has occasionally noted some blood in her emesis as well.  It is possible that all of the symptoms could be due to severe constipation however would be reasonable for us  to pursue further workup at this point.  Will check CBC, c-Met, TSH, lipase and H. pylori breath testing.  Given her severity of symptoms and worsening condition over the last couple weeks we will also check a CT scan to rule out obstruction or other intra-abdominal process.  She has been managing her constipation per GI with Ibsrela  though has had to use several enemas and suppositories to get even minimal relief.  She will continue with her to regimen for now depending on results of above workup.

## 2023-12-01 NOTE — Patient Instructions (Signed)
 It was very nice to see you today!  VISIT SUMMARY: Today, you were seen for worsening gastrointestinal symptoms, including severe abdominal pain, bloating, constipation, and nausea. We discussed your history of IBS and recent changes in your symptoms. We have ordered several tests to better understand the cause of your symptoms and to rule out other conditions.  YOUR PLAN: CONSTIPATION WITH ABDOMINAL PAIN, BLOATING, AND GASTROINTESTINAL SYMPTOMS: You are experiencing severe constipation along with abdominal pain, bloating, nausea, vomiting, and changes in your stool. These symptoms have worsened over the past week. -We have ordered blood work, an H. pylori breath test, and a CT scan to evaluate your abdominal organs, including your pancreas and gallbladder. -We are considering a referral to gastroenterology for further evaluation, which may include a colonoscopy and upper endoscopy.  IRRITABLE BOWEL SYNDROME: You have a long-standing history of IBS, which may be contributing to your current symptoms. -Continue to monitor your symptoms and follow up with us  if they worsen or do not improve. -We will review the results of your tests and determine the next steps in your treatment plan.  Return if symptoms worsen or fail to improve.   Take care, Dr Kennyth  PLEASE NOTE:  If you had any lab tests, please let us  know if you have not heard back within a few days. You may see your results on mychart before we have a chance to review them but we will give you a call once they are reviewed by us .   If we ordered any referrals today, please let us  know if you have not heard from their office within the next week.   If you had any urgent prescriptions sent in today, please check with the pharmacy within an hour of our visit to make sure the prescription was transmitted appropriately.   Please try these tips to maintain a healthy lifestyle:  Eat at least 3 REAL meals and 1-2 snacks per day.  Aim for  no more than 5 hours between eating.  If you eat breakfast, please do so within one hour of getting up.   Each meal should contain half fruits/vegetables, one quarter protein, and one quarter carbs (no bigger than a computer mouse)  Cut down on sweet beverages. This includes juice, soda, and sweet tea.   Drink at least 1 glass of water with each meal and aim for at least 8 glasses per day  Exercise at least 150 minutes every week.

## 2023-12-01 NOTE — Assessment & Plan Note (Signed)
 We reviewed her CT scan from last year which showed 3.7 cm ovarian cyst.  Discussed with patient that this may be contributing to some of her abdominal pain.  We are repeating CT scan as above which will further evaluate for this though her referral to GYN is pending for definitive management.

## 2023-12-01 NOTE — Progress Notes (Signed)
 Jody Carter is a 28 y.o. female who presents today for an office visit.  Assessment/Plan:  Chronic Problems Addressed Today: IBS (irritable bowel syndrome) Patient follows with gastroenterology for this and these to them about a month ago however unfortunately since her last visit with them she has had continued and worsening epigastric pain associated with constipation nausea and inability to tolerate p.o.  She has had ongoing issues with hematochezia which she attributes to hemorrhoids though has occasionally noted some blood in her emesis as well.  It is possible that all of the symptoms could be due to severe constipation however would be reasonable for us  to pursue further workup at this point.  Will check CBC, c-Met, TSH, lipase and H. pylori breath testing.  Given her severity of symptoms and worsening condition over the last couple weeks we will also check a CT scan to rule out obstruction or other intra-abdominal process.  She has been managing her constipation per GI with Ibsrela  though has had to use several enemas and suppositories to get even minimal relief.  She will continue with her to regimen for now depending on results of above workup.  Ovarian cyst We reviewed her CT scan from last year which showed 3.7 cm ovarian cyst.  Discussed with patient that this may be contributing to some of her abdominal pain.  We are repeating CT scan as above which will further evaluate for this though her referral to GYN is pending for definitive management.     Subjective:  HPI:  See assessment / plan for status of chronic conditions.    Discussed the use of AI scribe software for clinical note transcription with the patient, who gave verbal consent to proceed.  History of Present Illness Jody Carter is a 28 year old female with IBS who presents with worsening gastrointestinal symptoms.  Over the past week, she has experienced a significant worsening of her  gastrointestinal symptoms, including severe upper abdominal pain, bloating, and a sensation of tightness, described as feeling like her stomach is 'going to explode.' Constipation has been severe, requiring multiple enemas and suppositories for relief, and her bowel movements have an unusual odor, described as 'smelling like a cleaner.'  She has a history of IBS since she was 25-51 years old, but feels her current symptoms are more severe. Vomiting has occurred with a taste of gas, and she has noticed blood and brown material in her vomit, though this is not consistent. Changes in her stool include the presence of blood, which she attributes to hemorrhoids, and darker stool. Her menstrual cycle has been delayed, starting 21 days late, which she hopes will relieve some pressure.  She stopped taking Plaquenil  due to vision issues. She has been taking Ibsrela , which initially worked well but is now less effective as she struggles to eat due to nausea and fullness. Eating leads to vomiting, and she lacks the desire to eat due to pain and discomfort.  She recalls having a CT scan about a year ago and was told she had an ovarian cyst; she is unsure about the status of her appendix. She is unsure if she still has her appendix and mentions not having a gallbladder. She expresses concern about the possibility of other conditions such as celiac disease or issues with her pancreas or spleen.  During the review of symptoms, she notes significant bloating, difficulty passing gas, and a sensation of a 'ball' in her abdomen. She also reports fatigue and  poor sleep, contributing to her overall discomfort.         Objective:  Physical Exam: BP 100/60   Pulse 97   Temp 98.6 F (37 C) (Temporal)   Resp 20   Ht 5' 3 (1.6 m)   Wt 120 lb (54.4 kg)   SpO2 97%   BMI 21.26 kg/m   Gen: No acute distress, resting comfortably CV: Regular rate and rhythm with no murmurs appreciated Pulm: Normal work of breathing,  clear to auscultation bilaterally with no crackles, wheezes, or rhonchi Abdomen: No deformities.  Tender to palpation in epigastric area and right lower quadrant.  No rebound or guarding. Neuro: Grossly normal, moves all extremities Psych: Normal affect and thought content      Jody Carter M. Kennyth, MD 12/01/2023 11:58 AM

## 2023-12-02 ENCOUNTER — Ambulatory Visit: Payer: Self-pay | Admitting: Family Medicine

## 2023-12-02 LAB — H. PYLORI BREATH TEST: H. pylori Breath Test: NOT DETECTED

## 2023-12-02 NOTE — Progress Notes (Signed)
 Blood work is all normal.  We will contact her once we get results back on her CT scan.

## 2023-12-06 ENCOUNTER — Ambulatory Visit (HOSPITAL_BASED_OUTPATIENT_CLINIC_OR_DEPARTMENT_OTHER)
Admission: RE | Admit: 2023-12-06 | Discharge: 2023-12-06 | Disposition: A | Discharge status: Home / Self Care | Source: Ambulatory Visit | Attending: Family Medicine | Admitting: Family Medicine

## 2023-12-06 DIAGNOSIS — R1013 Epigastric pain: Secondary | ICD-10-CM | POA: Diagnosis present

## 2023-12-06 DIAGNOSIS — K5900 Constipation, unspecified: Secondary | ICD-10-CM | POA: Diagnosis present

## 2023-12-06 MED ORDER — IOHEXOL 300 MG/ML  SOLN
80.0000 mL | Freq: Once | INTRAMUSCULAR | Status: AC | PRN
  Administered 2023-12-06: 80 mL via INTRAVENOUS

## 2023-12-09 ENCOUNTER — Encounter

## 2023-12-12 NOTE — Progress Notes (Signed)
 Her CT scan does not show any acute abnormalities.  She does have fluid in the colon which is commonly seen with diarrhea but there were no signs of blockages or obstruction.  Do not need to make any changes to her treatment plan at this time.  She should follow-up with gastroenterology soon as we discussed at her office visit.

## 2023-12-18 ENCOUNTER — Other Ambulatory Visit: Payer: Self-pay | Admitting: Family Medicine

## 2023-12-19 ENCOUNTER — Ambulatory Visit (INDEPENDENT_AMBULATORY_CARE_PROVIDER_SITE_OTHER): Admitting: Otolaryngology

## 2023-12-19 ENCOUNTER — Encounter (INDEPENDENT_AMBULATORY_CARE_PROVIDER_SITE_OTHER): Payer: Self-pay | Admitting: Otolaryngology

## 2023-12-19 VITALS — BP 93/57 | Temp 98.1°F | Ht 63.0 in | Wt 121.0 lb

## 2023-12-19 DIAGNOSIS — J309 Allergic rhinitis, unspecified: Secondary | ICD-10-CM | POA: Diagnosis not present

## 2023-12-19 DIAGNOSIS — J3489 Other specified disorders of nose and nasal sinuses: Secondary | ICD-10-CM | POA: Diagnosis not present

## 2023-12-19 DIAGNOSIS — J342 Deviated nasal septum: Secondary | ICD-10-CM | POA: Insufficient documentation

## 2023-12-19 DIAGNOSIS — J343 Hypertrophy of nasal turbinates: Secondary | ICD-10-CM | POA: Diagnosis not present

## 2023-12-19 DIAGNOSIS — J301 Allergic rhinitis due to pollen: Secondary | ICD-10-CM

## 2023-12-19 NOTE — Progress Notes (Signed)
 CC: Chronic nasal obstruction  Discussed the use of AI scribe software for clinical note transcription with the patient, who gave verbal consent to proceed.  History of Present Illness Jody Carter is a 28 year old female who presents with difficulty breathing through her nose.  She has been experiencing difficulty breathing through her nose following two nasal fractures, with the most recent occurring approximately two months ago. She experiences discomfort, noting that her nose pops and moves when she talks. Her cartilage has been pushed outward, causing obstructions and aggravation.  She has a history of seasonal allergies and has had her tonsils and adenoids removed in 2007. She occasionally uses allergy medication and has been using Flonase nasal spray as needed. She has not seen an ENT specialist for her nasal issues until now, having initially visited urgent care and her regular doctor after the recent fracture.  She also has a diagnosis of lupus and is currently on Plaquenil. She experiences gastrointestinal issues, including vomiting after eating, and is in the process of addressing these with a GI specialist.  He   Past Medical History:  Diagnosis Date   Anxiety    Depression    Encounter for supervision of normal pregnancy, antepartum 05/27/2021                 FAMILY TREE         RESULTS      Language    English    Pap           Initiated care at         [facility]    Initial:            36wks:      Dating by    8wk US                 Support person         Genetics    NT/IT:     AFP:      Panorama:       BP cuff         Carrier Screen                     [place] Path/Hgb Elec           Rhogam                     TDaP vaccine         Blood Type              Epilepsy (HCC)    per patient   Fibromyalgia 2022   Hx of migraines    IBS (irritable bowel syndrome)    Panic disorder    Peripheral neuropathy    per patient   Seizures (HCC)    Susceptible to varicella (non-immune),  currently pregnant 07/03/2013   Thyroid  disease     Past Surgical History:  Procedure Laterality Date   ADENOIDECTOMY     BREAST SURGERY     augmentation   CHOLECYSTECTOMY     DILATION AND EVACUATION N/A 02/07/2019   Procedure: DILATATION AND EVACUATION;  Surgeon: Alger Gong, MD;  Location: [facility];  Service: Gynecology;  Laterality: N/A;   EYE SURGERY Bilateral    closed tear ducts opened as child   TONSILLECTOMY      Family History  Problem Relation Age of Onset   Hypertension Mother    Colon polyps Mother  Heart attack Father        at least 3   Drug abuse Father    Healthy Sister    Stomach cancer Paternal Aunt    Dementia Maternal Grandmother    Hypertension Maternal Grandmother    Heart attack Maternal Grandmother    Hypertension Maternal Grandfather    Heart attack Maternal Grandfather    Diabetes Paternal Grandfather    Breast cancer Paternal Great-grandmother    Colon cancer Other     Social History:  reports that she has quit smoking. Her smoking use included e-cigarettes. She has never been exposed to tobacco smoke. She has never used smokeless tobacco. She reports that she does not currently use alcohol. She reports that she does not use drugs.  Allergies:  Allergies  Allergen Reactions   Sumatriptan  Other (See Comments)    Neck and chest pressure, parasthesias    Prior to Admission medications   Medication Sig Start Date End Date Taking? Authorizing Provider  albuterol (VENTOLIN HFA) 108 (90 Base) MCG/ACT inhaler Inhale 2 puffs into the lungs. 11/08/22  Yes [provider]  AMBULATORY NON FORMULARY MEDICATION Medication Name: Diltiazem 2% + Lidaocaine 5% Apply a pea sized amount internally two times daily. 11/02/23  Yes May, Deanna J, NP  amitriptyline  (ELAVIL ) 25 MG tablet Take 2 tablets (50 mg total) by mouth at bedtime. 09/27/23  Yes Kennyth Worth HERO, MD  busPIRone  (BUSPAR ) 5 MG tablet Take 1 tablet (5 mg total) by mouth  2 (two) times daily. 09/27/23  Yes Parker, Caleb M, MD  fluocinonide (LIDEX) 0.05 % external solution SMARTSIG:10-15 Drop(s) Topical Every Night 02/09/23  Yes [provider]  furosemide  (LASIX ) 20 MG tablet TAKE 1 TABLET(20 MG) BY MOUTH DAILY 10/24/23  Yes Parker, Caleb M, MD  hydroxychloroquine (PLAQUENIL) 200 MG tablet Take 200 mg by mouth 2 (two) times daily. 09/22/23  Yes [provider]  IBSRELA  50 MG TABS TAKE 1 TABLET BY MOUTH TWICE DAILY BEFORE A MEAL 11/01/23  Yes May, Deanna J, NP  levETIRAcetam  (KEPPRA ) 250 MG tablet TAKE 1 TABLET(250 MG) BY MOUTH TWICE DAILY 11/07/23  Yes Parker, Caleb M, MD  levothyroxine  (SYNTHROID ) 88 MCG tablet Take 1 tablet (88 mcg total) by mouth daily before breakfast. 11/23/23  Yes Kennyth Worth HERO, MD  meloxicam  (MOBIC ) 15 MG tablet Take 1 tablet (15 mg total) by mouth daily. 02/28/23  Yes Leonce Katz, DO  ondansetron  (ZOFRAN -ODT) 4 MG disintegrating tablet Take 1 tablet (4 mg total) by mouth every 12 (twelve) hours as needed for nausea or vomiting. 07/18/23  Yes May, Deanna J, NP  pantoprazole  (PROTONIX ) 40 MG tablet TAKE 1 TABLET(40 MG) BY MOUTH DAILY 12/19/23  Yes Kennyth Worth HERO, MD  predniSONE  (DELTASONE ) 5 MG tablet Take by mouth. 09/21/23  Yes [provider]  promethazine  (PHENERGAN ) 25 MG tablet Take 1 tablet (25 mg total) by mouth every 6 (six) hours as needed for nausea or vomiting. 01/05/23  Yes Theadore Ozell HERO, MD  spironolactone (ALDACTONE) 50 MG tablet Take by mouth. 11/29/22  Yes [provider]  Vitamin D , Ergocalciferol , (DRISDOL) 1.25 MG (50000 UNIT) CAPS capsule Take 50,000 Units by mouth every 7 (seven) days. 02/18/20  Yes [provider]  zolpidem  (AMBIEN ) 5 MG tablet Take 1 tablet (5 mg total) by mouth at bedtime as needed for sleep. 08/24/23  Yes Kennyth Worth HERO, MD    Blood pressure (!) 93/57, temperature 98.1 F (36.7 C), temperature source Oral, height 5' 3 (1.6 m),  weight 121 lb (54.9 kg), last  menstrual period 10/29/2023, SpO2 99%. Exam: General: Communicates without difficulty, well nourished, no acute distress. Head: Normocephalic, no evidence injury, no tenderness, facial buttresses intact without stepoff. Face/sinus: No tenderness to palpation and percussion. Facial movement is normal and symmetric. Eyes: PERRL, EOMI. No scleral icterus, conjunctivae clear. Neuro: CN II exam reveals vision grossly intact.  No nystagmus at any point of gaze. Ears: Auricles well formed without lesions.  Ear canals are intact without mass or lesion.  No erythema or edema is appreciated.  The TMs are intact without fluid. Nose: External evaluation reveals normal support and skin without lesions.  Dorsum is intact.  Anterior rhinoscopy reveals congested mucosa over anterior aspect of inferior turbinates and deviated septum.  No purulence noted. Oral:  Oral cavity and oropharynx are intact, symmetric, without erythema or edema.  Mucosa is moist without lesions. Neck: Full range of motion without pain.  There is no significant lymphadenopathy.  No masses palpable.  Thyroid  bed within normal limits to palpation.  Parotid glands and submandibular glands equal bilaterally without mass.  Trachea is midline. Neuro:  CN 2-12 grossly intact.   Procedure:  Flexible Nasal Endoscopy: Description: Risks, benefits, and alternatives of flexible endoscopy were explained to the patient.  Specific mention was made of the risk of throat numbness with difficulty swallowing, possible bleeding from the nose and mouth, and pain from the procedure.  The patient gave oral consent to proceed.  The flexible scope was inserted into the right nasal cavity.  Endoscopy of the interior nasal cavity, superior, inferior, and middle meatus was performed. The sphenoid-ethmoid recess was examined. Edematous mucosa was noted.  No polyp, mass, or lesion was appreciated. Nasal septal deviation noted. Olfactory cleft was clear.  Nasopharynx was clear.   Turbinates were hypertrophied but without mass.  The procedure was repeated on the contralateral side with similar findings.  The patient tolerated the procedure well.    Assessment and Plan Assessment & Plan Chronic nasal obstruction, secondary to deviated nasal septum and bilateral inferior turbinate hypertrophy Deviated nasal septum with significant inferior turbinate hypertrophy causing nasal obstruction and difficulty breathing. The septum is severely deviated, and the turbinates are swollen, reducing nasal passage space. No polyps observed. The external nasal dorsum is also deviated, likely secondary to her previous nasal fractures. - Recommended daily use of Flonase nasal spray, two sprays in each nostril, to reduce mucosal swelling. - Discussed septoplasty and turbinate reduction surgery to improve nasal breathing. Surgery will not alter external nasal appearance.  The risk, benefits, and details of the procedures are extensively discussed. - Advised considering consultation with a plastic surgeon for rhinoplasty if external nasal correction is desired. - The patient would like to consider her options.  She is encouraged to call with any questions or concerns.  Allergic rhinitis Intermittent allergic rhinitis managed with occasional use of allergy medications and Flonase nasal spray. - The patient is encouraged to use the Flonase nasal spray on a consistent daily basis.  The importance of consistent daily use is discussed. - Nasal saline irrigation is also encouraged.       Rondle Lohse W Vann Okerlund 12/19/2023, 1:56 PM

## 2024-01-20 ENCOUNTER — Other Ambulatory Visit: Payer: Self-pay | Admitting: Family Medicine

## 2024-01-20 DIAGNOSIS — R609 Edema, unspecified: Secondary | ICD-10-CM

## 2024-01-30 ENCOUNTER — Other Ambulatory Visit: Payer: Self-pay | Admitting: Gastroenterology

## 2024-02-09 ENCOUNTER — Other Ambulatory Visit: Payer: Self-pay | Admitting: Family Medicine

## 2024-02-14 ENCOUNTER — Encounter

## 2024-02-17 ENCOUNTER — Telehealth: Admitting: Family Medicine

## 2024-02-17 DIAGNOSIS — L709 Acne, unspecified: Secondary | ICD-10-CM | POA: Diagnosis not present

## 2024-02-17 DIAGNOSIS — G4709 Other insomnia: Secondary | ICD-10-CM | POA: Diagnosis not present

## 2024-02-17 DIAGNOSIS — F419 Anxiety disorder, unspecified: Secondary | ICD-10-CM | POA: Diagnosis not present

## 2024-02-17 MED ORDER — SPIRONOLACTONE 50 MG PO TABS: 50.0000 mg | ORAL_TABLET | Freq: Once | ORAL | 0 refills | Status: AC

## 2024-02-17 MED ORDER — FLUOCINONIDE 0.05 % EX SOLN: Freq: Every evening | CUTANEOUS | 0 refills | Status: AC

## 2024-02-17 MED ORDER — ZOLPIDEM TARTRATE 5 MG PO TABS: 5.0000 mg | ORAL_TABLET | Freq: Every evening | ORAL | 1 refills | Status: AC | PRN

## 2024-02-17 NOTE — Assessment & Plan Note (Signed)
 She has been under more stress recently due to ending a relationship but overall symptoms are mostly manageable.  She actually feels like her anxiety is better controlled now than it was when she was in a relationship.  She is on BuSpar  5 mg twice daily which seems to be working well.  She is agreeable to see a therapist and we will place referral today.  She will follow-up with us  in a few weeks via MyChart
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
## 2024-02-17 NOTE — Assessment & Plan Note (Signed)
 Tolerating Ambien  well.  Uses sporadically as needed.  Will refill today.

## 2024-02-17 NOTE — Assessment & Plan Note (Signed)
 Patient is currently transitioning to different dermatologist however is out of her fluocinonide ointment and spironolactone.  She would like for us  to refill both of these today until she can get back into see dermatology.  Will refill today.  She will be seeing her dermatologist in a couple of months.

## 2024-04-19 ENCOUNTER — Ambulatory Visit: Admitting: Neurology

## 2024-09-19 ENCOUNTER — Ambulatory Visit
